# Patient Record
Sex: Female | Born: 1965 | Race: Black or African American | Hispanic: No | Smoking: Never smoker
Health system: Southern US, Community
[De-identification: ages and names within clinical notes are randomized; demographics above are authoritative.]

## PROBLEM LIST (undated history)

## (undated) DIAGNOSIS — E785 Hyperlipidemia, unspecified: Secondary | ICD-10-CM

## (undated) DIAGNOSIS — M25569 Pain in unspecified knee: Secondary | ICD-10-CM

## (undated) DIAGNOSIS — E038 Other specified hypothyroidism: Secondary | ICD-10-CM

## (undated) DIAGNOSIS — N83209 Unspecified ovarian cyst, unspecified side: Secondary | ICD-10-CM

## (undated) DIAGNOSIS — M1990 Unspecified osteoarthritis, unspecified site: Secondary | ICD-10-CM

## (undated) DIAGNOSIS — G8929 Other chronic pain: Secondary | ICD-10-CM

## (undated) DIAGNOSIS — I10 Essential (primary) hypertension: Secondary | ICD-10-CM

## (undated) HISTORY — PX: TUBAL LIGATION: SHX77

## (undated) HISTORY — DX: Hyperlipidemia, unspecified: E78.5

## (undated) HISTORY — DX: Other specified hypothyroidism: E03.8

## (undated) HISTORY — DX: Unspecified osteoarthritis, unspecified site: M19.90

## (undated) HISTORY — DX: Unspecified ovarian cyst, unspecified side: N83.209

## (undated) HISTORY — PX: OVARIAN CYST REMOVAL: SHX89

## (undated) HISTORY — DX: Essential (primary) hypertension: I10

---

## 2001-10-07 ENCOUNTER — Emergency Department (HOSPITAL_COMMUNITY): Admission: EM | Admit: 2001-10-07 | Discharge: 2001-10-08 | Payer: Self-pay | Admitting: Emergency Medicine

## 2001-10-08 ENCOUNTER — Encounter: Payer: Self-pay | Admitting: Emergency Medicine

## 2004-07-27 ENCOUNTER — Emergency Department (HOSPITAL_COMMUNITY): Admission: EM | Admit: 2004-07-27 | Discharge: 2004-07-27 | Payer: Self-pay | Admitting: Emergency Medicine

## 2004-08-13 ENCOUNTER — Emergency Department (HOSPITAL_COMMUNITY): Admission: EM | Admit: 2004-08-13 | Discharge: 2004-08-13 | Payer: Self-pay | Admitting: Emergency Medicine

## 2004-08-17 ENCOUNTER — Inpatient Hospital Stay (HOSPITAL_COMMUNITY): Admission: RE | Admit: 2004-08-17 | Discharge: 2004-08-19 | Payer: Self-pay | Admitting: Obstetrics and Gynecology

## 2005-05-21 ENCOUNTER — Emergency Department (HOSPITAL_COMMUNITY): Admission: EM | Admit: 2005-05-21 | Discharge: 2005-05-21 | Payer: Self-pay | Admitting: Emergency Medicine

## 2005-08-23 ENCOUNTER — Emergency Department (HOSPITAL_COMMUNITY): Admission: EM | Admit: 2005-08-23 | Discharge: 2005-08-23 | Payer: Self-pay | Admitting: Emergency Medicine

## 2005-10-13 ENCOUNTER — Observation Stay (HOSPITAL_COMMUNITY): Admission: EM | Admit: 2005-10-13 | Discharge: 2005-10-14 | Payer: Self-pay | Admitting: Emergency Medicine

## 2007-04-13 ENCOUNTER — Emergency Department (HOSPITAL_COMMUNITY): Admission: EM | Admit: 2007-04-13 | Discharge: 2007-04-13 | Payer: Self-pay | Admitting: Emergency Medicine

## 2007-12-20 ENCOUNTER — Emergency Department (HOSPITAL_COMMUNITY): Admission: EM | Admit: 2007-12-20 | Discharge: 2007-12-20 | Payer: Self-pay | Admitting: Emergency Medicine

## 2008-01-14 ENCOUNTER — Ambulatory Visit (HOSPITAL_COMMUNITY): Admission: RE | Admit: 2008-01-14 | Discharge: 2008-01-14 | Payer: Self-pay | Admitting: Internal Medicine

## 2008-09-03 ENCOUNTER — Emergency Department (HOSPITAL_COMMUNITY): Admission: EM | Admit: 2008-09-03 | Discharge: 2008-09-03 | Payer: Self-pay | Admitting: Emergency Medicine

## 2008-09-23 ENCOUNTER — Emergency Department (HOSPITAL_COMMUNITY): Admission: EM | Admit: 2008-09-23 | Discharge: 2008-09-23 | Payer: Self-pay | Admitting: Emergency Medicine

## 2008-12-06 ENCOUNTER — Emergency Department (HOSPITAL_COMMUNITY): Admission: EM | Admit: 2008-12-06 | Discharge: 2008-12-06 | Payer: Self-pay | Admitting: Emergency Medicine

## 2009-03-10 ENCOUNTER — Emergency Department (HOSPITAL_COMMUNITY): Admission: EM | Admit: 2009-03-10 | Discharge: 2009-03-10 | Payer: Self-pay | Admitting: Emergency Medicine

## 2009-04-13 ENCOUNTER — Emergency Department (HOSPITAL_COMMUNITY): Admission: EM | Admit: 2009-04-13 | Discharge: 2009-04-13 | Payer: Self-pay | Admitting: Emergency Medicine

## 2009-10-21 IMAGING — CR DG CHEST 2V
2 series · 2 of 2 positions shown · non-contrast
Comparison: None

CLINICAL DATA: Chest pain and shortness of breath.

CHEST - 2 VIEW

[view not recorded (1 of 2)]
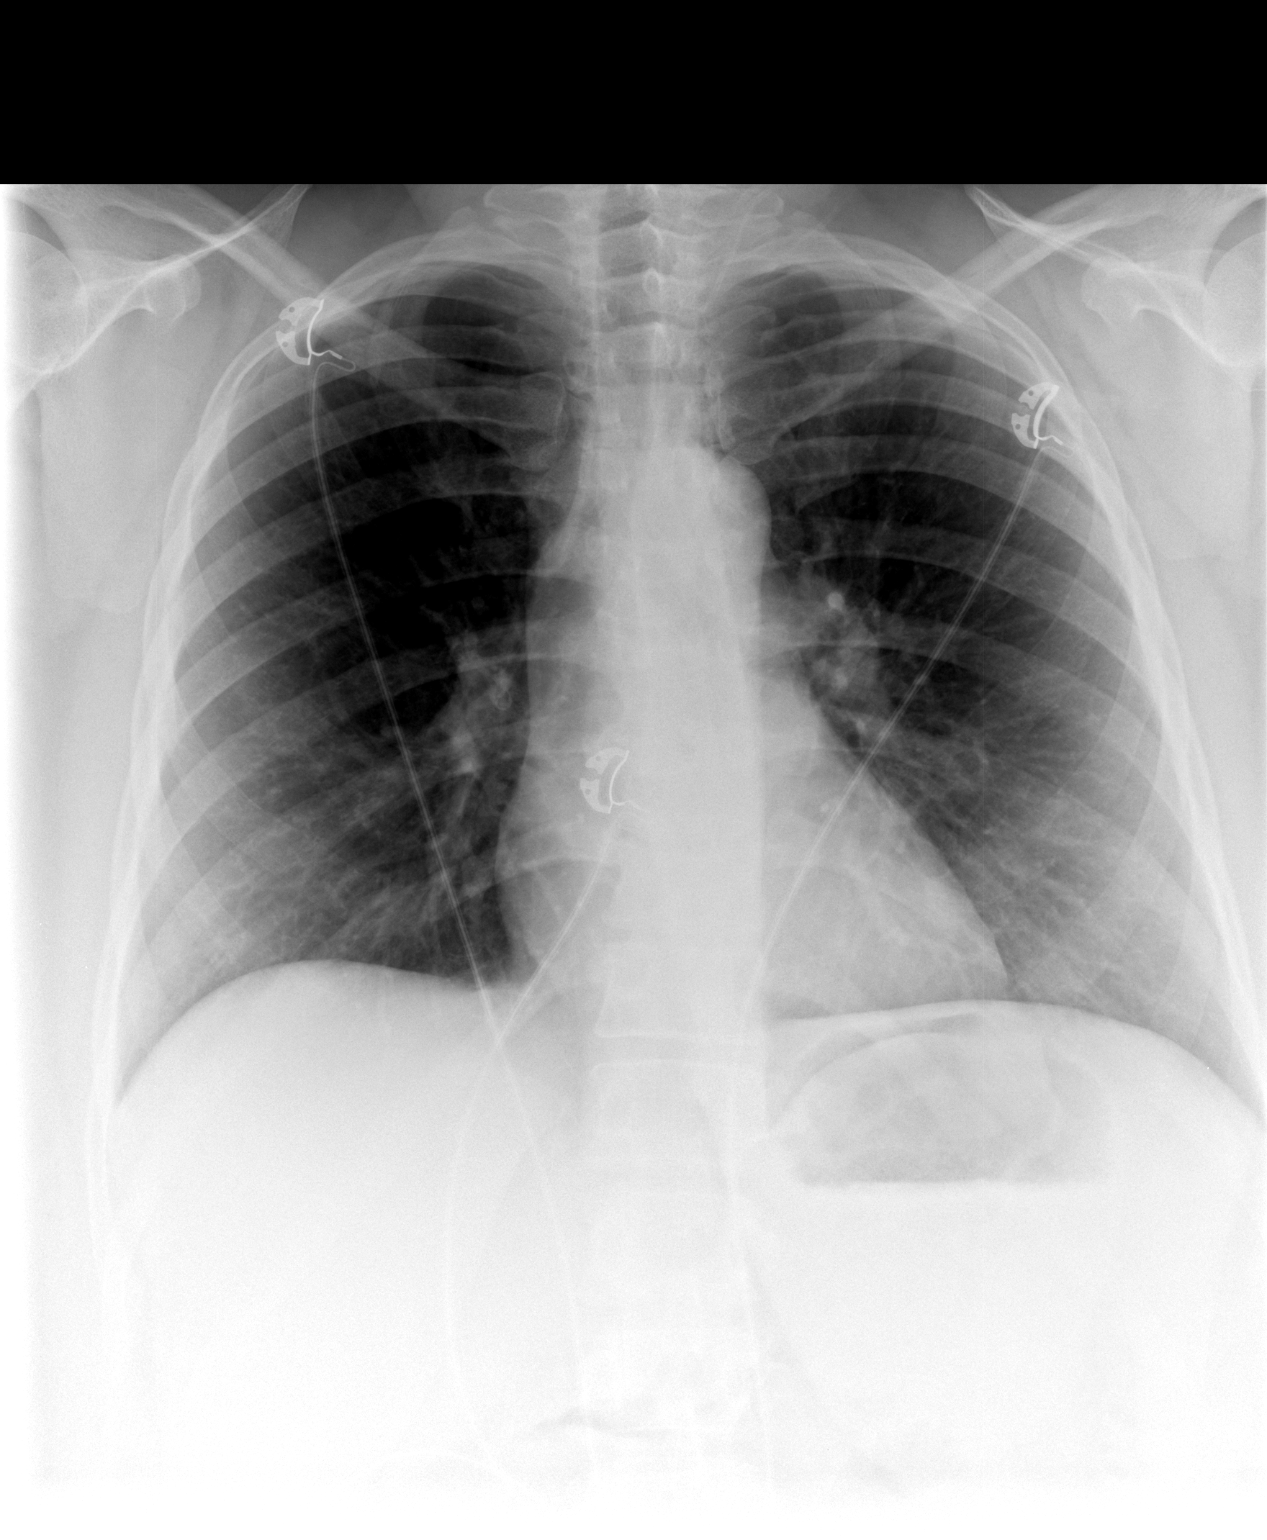

[view not recorded (2 of 2)]
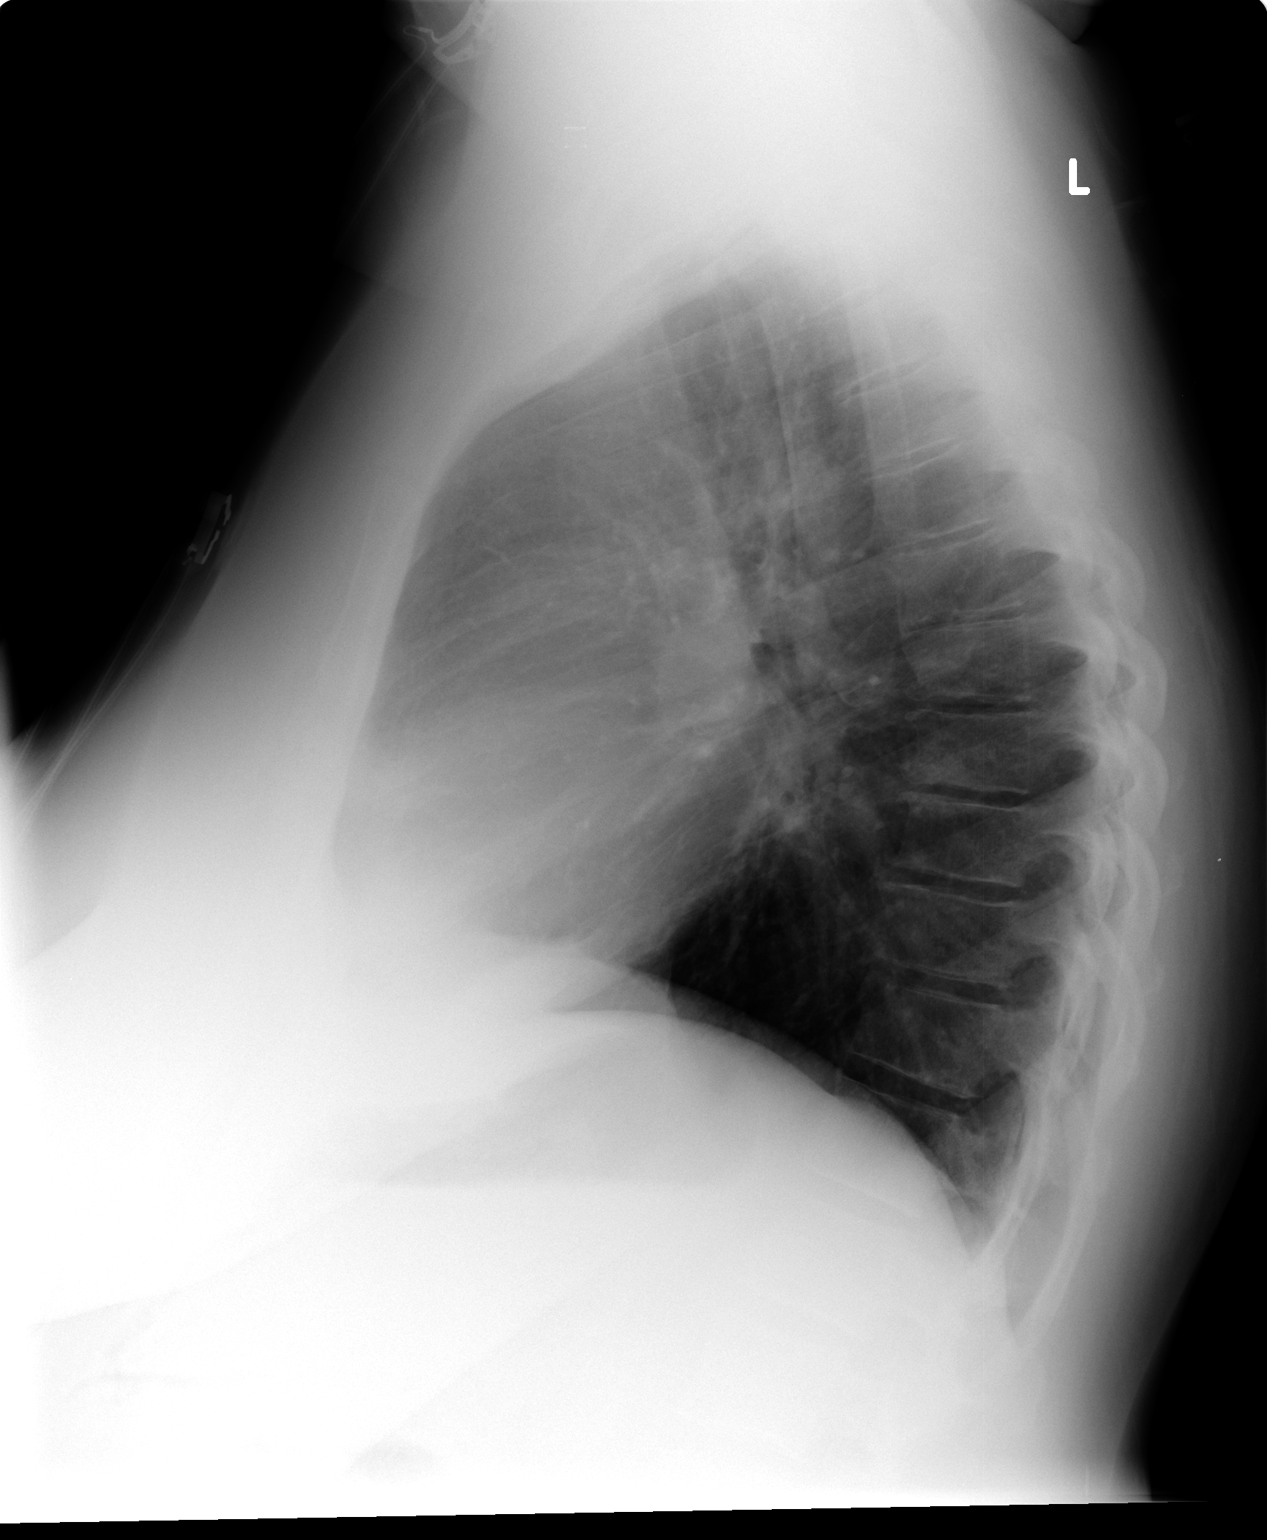

[2 of 2 positions shown; findings below may reference images not displayed]

FINDINGS: Trachea is midline.  Heart size normal.  Lungs are clear.
No pleural fluid.
IMPRESSION: No acute findings.

## 2010-07-02 ENCOUNTER — Emergency Department (HOSPITAL_COMMUNITY): Admission: EM | Admit: 2010-07-02 | Discharge: 2010-07-02 | Payer: Self-pay | Admitting: Emergency Medicine

## 2010-12-03 ENCOUNTER — Encounter: Payer: Self-pay | Admitting: Internal Medicine

## 2010-12-04 ENCOUNTER — Encounter: Payer: Self-pay | Admitting: Family Medicine

## 2011-01-06 ENCOUNTER — Emergency Department (HOSPITAL_COMMUNITY)
Admission: EM | Admit: 2011-01-06 | Discharge: 2011-01-06 | Disposition: A | Discharge status: Home / Self Care | Payer: Self-pay | Attending: Emergency Medicine | Admitting: Emergency Medicine

## 2011-01-06 DIAGNOSIS — M549 Dorsalgia, unspecified: Secondary | ICD-10-CM | POA: Insufficient documentation

## 2011-01-06 DIAGNOSIS — N390 Urinary tract infection, site not specified: Secondary | ICD-10-CM | POA: Insufficient documentation

## 2011-01-06 LAB — URINALYSIS, ROUTINE W REFLEX MICROSCOPIC
Bilirubin Urine: NEGATIVE
Ketones, ur: NEGATIVE mg/dL
Nitrite: NEGATIVE
Protein, ur: NEGATIVE mg/dL
Urobilinogen, UA: 0.2 mg/dL (ref 0.0–1.0)

## 2011-01-06 LAB — URINE MICROSCOPIC-ADD ON

## 2011-01-08 LAB — URINE CULTURE

## 2011-02-19 LAB — URINALYSIS, ROUTINE W REFLEX MICROSCOPIC
Bilirubin Urine: NEGATIVE
Glucose, UA: NEGATIVE mg/dL
Nitrite: NEGATIVE
Protein, ur: NEGATIVE mg/dL
Specific Gravity, Urine: 1.01 (ref 1.005–1.030)
Urobilinogen, UA: 0.2 mg/dL (ref 0.0–1.0)
pH: 5.5 (ref 5.0–8.0)

## 2011-02-19 LAB — CBC
HCT: 35.7 % — ABNORMAL LOW (ref 36.0–46.0)
Hemoglobin: 12.7 g/dL (ref 12.0–15.0)
Platelets: 154 10*3/uL (ref 150–400)
RDW: 12.4 % (ref 11.5–15.5)
WBC: 12.3 10*3/uL — ABNORMAL HIGH (ref 4.0–10.5)

## 2011-02-19 LAB — BASIC METABOLIC PANEL
BUN: 9 mg/dL (ref 6–23)
CO2: 26 mEq/L (ref 19–32)
GFR calc Af Amer: >60 mL/min (ref >60)
Glucose, Bld: 114 mg/dL — ABNORMAL HIGH (ref 70–99)
Sodium: 141 mEq/L (ref 135–145)

## 2011-02-19 LAB — DIFFERENTIAL
Basophils Absolute: 0 10*3/uL (ref 0.0–0.1)
Eosinophils Relative: 0 % (ref 0–5)
Lymphs Abs: 0.9 10*3/uL (ref 0.7–4.0)

## 2011-02-19 LAB — URINE MICROSCOPIC-ADD ON

## 2011-02-26 LAB — BASIC METABOLIC PANEL
CO2: 25 mEq/L (ref 19–32)
Chloride: 114 mEq/L — ABNORMAL HIGH (ref 96–112)
Creatinine, Ser: 0.7 mg/dL (ref 0.4–1.2)
GFR calc Af Amer: >60 mL/min (ref >60)
GFR calc non Af Amer: >60 mL/min (ref >60)
Glucose, Bld: 117 mg/dL — ABNORMAL HIGH (ref 70–99)
Sodium: 144 mEq/L (ref 135–145)

## 2011-02-26 LAB — DIFFERENTIAL
Basophils Absolute: 0.1 10*3/uL (ref 0.0–0.1)
Monocytes Absolute: 0.6 10*3/uL (ref 0.1–1.0)
Neutrophils Relative %: 76 % (ref 43–77)

## 2011-02-26 LAB — CBC
Platelets: 180 10*3/uL (ref 150–400)
WBC: 10.6 10*3/uL — ABNORMAL HIGH (ref 4.0–10.5)

## 2011-02-26 LAB — POCT CARDIAC MARKERS
CKMB, poc: <1 ng/mL — ABNORMAL LOW (ref 1.0–8.0)
Myoglobin, poc: 37.7 ng/mL (ref 12–200)
Troponin i, poc: <0.05 ng/mL (ref 0.00–0.09)

## 2011-02-26 LAB — D-DIMER, QUANTITATIVE: D-Dimer, Quant: 0.52 ug/mL-FEU — ABNORMAL HIGH (ref 0.00–0.48)

## 2011-03-30 NOTE — Op Note (Signed)
NAME:  Erica Greer, Erica Greer NO.:  [id number]   MEDICAL RECORD NO.:  [id number]          PATIENT TYPE:  AMB   LOCATION:  DAY                           FACILITY:  [facility]   PHYSICIAN:  Tilda Burrow, M.D. DATE OF BIRTH:  04/16/1966   DATE OF PROCEDURE:  08/17/2004  DATE OF DISCHARGE:                                 OPERATIVE REPORT   PREOPERATIVE DIAGNOSIS:  Left ovarian dermoid, symptomatic, left lower  quadrant pain.   POSTOPERATIVE DIAGNOSIS:  1.  Left ovarian dermoid, symptomatic, left lower quadrant pain.  2.  Right ovarian cyst.   OPERATION/PROCEDURE:  1.  Left oophorectomy.  2.  Incision and drainage right ovarian cyst.   SURGEON:  Tilda Burrow, M.D.   ASSISTANTAsencion Noble, C.S.T.-F.A.   ANESTHESIA:  General.   COMPLICATIONS:  None.   FINDINGS:  1.  Small anteflexed uterus, large mobile, elongate, extremely hard left      ovary with dermoid tissue and thickened hyperthecosis suspected of the      cortex, restricting expansion of the ovary.  2.  Right ovarian cyst 4 cm in diameter, similarly showing no tendency to      rupture but easily drained.   DESCRIPTION OF PROCEDURE:  The patient was taken to the operating room and  prepped and draped in the usual fashion for lower abdominal surgery.  Foley  catheter was in place.  Pfannenstiel-type incision had been performed in  standard fashion using approximately 15 cm long incision.  Fascia was opened  transversely.  The peritoneal cavity was entered without trauma using a  blunt spreading technique.  Incision was extended sharply, cephalad and  caudad, and the pelvis explored manually.  The large left ovary could easily  be flipped up into the incision.  Kelly clamps were placed beneath it and  the ovary was removed along with the very small amount of remnant of the  tube.  The proximal portion of the tube was left in situ.  The double  ligature with 0 Vicryl was performed and the specimen passed off.   Palpation  revealed that there was a little bit more tightness than is desired in the  area and so the pedicle was split into two parts, the infundibulopelvic  ligament and the broad ligament remnants.  These were tied separately.  Hemostasis was confirmed.   Attention was then directed to the right ovary which was palpated, found to  be equally large, but somewhat softer in character.  Aspiration of a firm,  possibly cystic area drained of a characteristic simple cyst fluid.  The  cyst was opened sharply for a distance of 2 cm, inspected and found no  internal tissues.  Ovary was left intact and in situ.   Laparotomy equipment was removed.  The anterior peritoneum closed with 2-0  chromic, the fascia closed with continue running 0 Vicryl.  Subcutaneous  tissue reapproximated using interrupted 2-0 plain and staple closure of the  skin completed the procedure.  The patient tolerated the procedure well and  went to the recovery room in good condition with sponge  and needle counts  correct.     John   JVF/MEDQ  D:  08/17/2004  T:  08/17/2004  Job:  [id number]   cc:   [facility]

## 2011-03-30 NOTE — Discharge Summary (Signed)
[name], [name] NO.:  [id number]   MEDICAL RECORD NO.:  [id number]          PATIENT TYPE:  INP   LOCATION:  A320                          FACILITY:  [facility]   PHYSICIAN:  Tilda Burrow, M.D. DATE OF BIRTH:  1966/01/13   DATE OF ADMISSION:  08/17/2004  DATE OF DISCHARGE:  10/08/2005LH                                 DISCHARGE SUMMARY   ADMITTING DIAGNOSES:  1.  Left Lower Quadrant pain , suspected left ovarian dermoid cyst.  2.  Suspected adnexal torsion.  3.  Left lower quadrant pain, severe.  4.  Recent history of trichomoniasis.   DISCHARGE DIAGNOSES:  1.  Left ovarian dermoid cyst, benign.  2.  Left lower quadrant pain, resolved.  3.  Recent history of trichomoniasis.   DISCHARGE MEDICATIONS:  1.  Tylox one to two q.4h. p.r.n. pain, dispensed 20.   HOSPITAL SUMMARY:  This 45 year old female with left adnexal pain associated  with an ultrasound confirmed left ovarian dermoid is admitted for removal of  the left tube and ovary and evaluation of the adnexa for other etiologies.  She underwent left oophorectomy and right ovarian cyst aspiration on August 17, 2004 delivering a large left ovarian dermoid and right ovarian cyst which  was simple, cystic, and easily drained.  Postoperatively, the patient had a  soft abdomen with vomiting on the first postoperative day.  Hemoglobin 13.6,  hematocrit 40%, stable.  Postoperatively, she was doing well enough with  passage of gas and tolerance of regular diet with normal bowel function on  August 19, 2004.  Was thus discharged home with follow-up one week for  staple removal and continuation of care.     John   JVF/MEDQ  D:  09/17/2004  T:  09/18/2004  Job:  [id number]

## 2011-03-30 NOTE — H&P (Signed)
NAME:  Erica Greer, Erica Greer NO.:  [id number]   MEDICAL RECORD NO.:  [id number]           PATIENT TYPE:   LOCATION:                                FACILITY:  [facility]   PHYSICIAN:  Tilda Burrow, M.D. DATE OF BIRTH:  1966/10/18   DATE OF ADMISSION:  08/17/2004  DATE OF DISCHARGE:  LH                                HISTORY & PHYSICAL   ADMITTING DIAGNOSIS:  1.  Possible left ovarian dermoid cyst.  2.  Suspected adnexal torsion with severe left lower quadrant pain.  3.  Recent Hx Trichomoniasis.   HISTORY OF PRESENT ILLNESS:  This 45 year old female, gravida 5, para 5,  status post tubal ligation, LMP - August 10, 2004, is admitted at this  time for laparotomy and removal of the left tube and ovary.  She was seen in  the free clinic last Thursday, August 11, 2004, where records were  reviewed indicating that she had a large left ovarian dermoid cyst that was  causing her pain.  Over the weekend, the condition worsened and she was seen  in the emergency room, received analgesics.  She has been unable to work  today, August 16, 2004, due to the pain.   PHYSICAL EXAMINATION:  PELVIC:  Shows exquisitely tender left adnexa, even  though the patient says she feels better than she did yesterday or the day  before.  ABDOMEN:  Bowel sounds are active.  There is no rebound tenderness.   The ultrasound confirmed left adnexal dermoid and an adjacent ovarian cyst.  The combination of these two structures is increasing the possibility of  partial or complete torsion of the ovary.  The slightly improved symptoms  allow [place] to plan surgery for tomorrow.  The patient is comfortable with this  plan.  We will proceed with laparotomy and removal of the left tube and  ovary tomorrow.  The procedure is reviewed with the patient including risks,  benefits, and complications, the need for additional surgeries should  abnormalities beyond those already identified be encountered have  been  discussed.   PAST MEDICAL HISTORY:  Benign.   SURGICAL HISTORY:  Tubal ligation, 1995, Tilda Burrow, M.D. at [facility].   ALLERGIES:  None.   MEDICATIONS:  Hydrocodone, Tylenol.   VITAL SIGNS:  Height , weight 248, blood pressure 120/78.   PHYSICAL EXAMINATION:  GENERAL:  Shows a large-framed, stocky, African  American female, alert, oriented x 3.  NECK:  Supple.  CARDIOVASCULAR:  Unremarkable.  BREAST:  Deferred.  ABDOMEN:  Shows tenderness in the left lower quadrant without rebound.  GENITALIA:  External genitalia shows normal female anatomy with frothy  vaginal discharge with odor.  Wet prep shows Trichomonas present.  Cervix is  mobile and nontender.  Uterus is anteflexed.  There is pain on the left side  and the dermoid and the cyst are flipped anteriorly and press on the dome of  the bladder.  Bowel sounds are present.   IMPRESSION:  1.  Left ovarian dermoid, left ovarian cyst.  2.  Possible partial torsion versus simply pressure  effect causing      discomfort.   We will proceed with laparotomy in a.m.  The patient is comfortable using  oral analgesics at present.  Risks and benefits of surgery have been  discussed with the patient.  She is to go home tonight, take Tindamax 500 mg  four tablets for her trichomoniasis, Dulcolax to evacuate the bowel, and  remain NPO.  Surgery at noon on August 17, 2004.     John   JVF/MEDQ  D:  08/16/2004  T:  08/16/2004  Job:  [id number]   cc:   [facility]

## 2011-03-30 NOTE — H&P (Signed)
NAME:  Erica, Greer NO.:  [id number]   MEDICAL RECORD NO.:  [id number]          PATIENT TYPE:  EMS   LOCATION:  ED                            FACILITY:  [facility]   PHYSICIAN:  Margaretmary Dys, M.D.DATE OF BIRTH:  07-30-1966   DATE OF ADMISSION:  10/13/2005  DATE OF DISCHARGE:  LH                                HISTORY & PHYSICAL   ADMISSION DIAGNOSES:  1.  Nausea.  2.  Vomiting.  3.  Diarrhea.  4.  Acute gastroenteritis.  5.  Dehydration.   PRIMARY CARE PHYSICIAN:  Unassigned.   CHIEF COMPLAINT:  Nausea, vomiting, diarrhea of one day duration.   HISTORY OF PRESENT ILLNESS:  Erica Greer is a 45 year old African American  female who presents to the emergency room with approximately a 13-hour  history of nausea, intractable vomiting, abdominal cramping and some  diarrhea.   The patient said her symptoms were sudden. Most of the abdominal pain in the  epigastric area appeared to resolve transiently with vomiting. She denies  any abdominal distention. She had no fever or chills, no night sweats, no  headaches, dizziness or lightheadedness. There is no similar history in her  other family members. She does not remember what she ate on Thursday night.  She does not eat breakfast.   Evaluation in the emergency room revealed a negative CT of the abdomen and  pelvis. However, she does continue to vomit despite receiving Zofran and  Phenergan,  the emergency room physician then put an NG tube in, and the  patient has felt some relief since.   The patient has been admitted now for further evaluation and management. She  says the abdominal pain is better. It remains in the epigastric region about  4/10 with no radiation.   REVIEW OF SYSTEMS:  A 10-point review of systems is otherwise negative  except as mentioned in the History of Present Illness.   PAST MEDICAL HISTORY:  None.   CHRONIC MEDICATIONS:  None.   FAMILY HISTORY:  Noncontributory.   SOCIAL  HISTORY:  She is married, lives with husband who is present during  this interview. She has five children. No symptoms in family members. She is  a lifelong nonsmoker. She does not drink alcohol. Denies any use of drugs.  She works in a [employer].   PHYSICAL EXAMINATION:  GENERAL:  The patient was conscious, alert,  comfortable but did appear fatigued.  GASTROINTESTINAL:  She had an NG tube in place.  VITAL SIGNS:  On arrival her blood pressure 151/98, temperature 98.7, pulse  72, respiratory rate 16, a pain scale that was 10/10, oxygen saturation 99%  on room air.  HEENT:  Normocephalic, atraumatic. The patient has an NG tube in her right  nostril. Oral mucosa was dry. No exudates were noted.  NECK:  Supple with no JVD or lymphadenopathy.  LUNGS:  Clear and equal with good air entry bilaterally.  HEART:  S1, S2, regular. No S3, S4, gallops or rubs.  ABDOMEN:  Soft, nontender, bowel sounds are positive. No masses palpable.  EXTREMITIES:  No pitting pedal edema.  No cough, induration or tenderness was  noted.  SKIN:  Grossly intact, no focal deficits.   LABORATORY STUDIES:  White blood cell count 10.6, hemoglobin 13.9,  hematocrit 40.9, platelet count 189, neutrophils 85%, lymphocytes 11%.  Sodium 140, potassium 3.5, chloride 111, CO2 21, glucose 129, BUN 7,  creatinine 0.7, total bili being 0.6, AST 21, ALT 28, total protein 6.6,  albumin 3.8, calcium 8.6. Urine pregnancy test was negative. Urinalysis was  negative.   ASSESSMENT/PLAN:  Erica Greer is a 45 year old African American female who  presents with nausea, vomiting, abdominal pain.   It does appear that she may likely have a viral gastroenteritis. The plan is  to admit her at this time for hydration, and we will control nausea and  vomiting. She currently has an NG tube. We will keep that in place for a few  more hours. If the patient settles down and feels a lot better we can pull  the NG tube and start her on  some clear liquids.   We will check followup on electrolytes on her in the morning. I have  discussed the above plan with the patient who verbalized full understanding.   CODE STATUS:  She is a full code.      Margaretmary Dys, M.D.  Electronically Signed     AM/MEDQ  D:  10/13/2005  T:  10/13/2005  Job:  [id number]

## 2011-07-09 ENCOUNTER — Encounter: Payer: Self-pay | Admitting: Emergency Medicine

## 2011-07-09 ENCOUNTER — Emergency Department (HOSPITAL_COMMUNITY)
Admission: EM | Admit: 2011-07-09 | Discharge: 2011-07-09 | Disposition: A | Discharge status: Home / Self Care | Payer: Self-pay | Attending: Emergency Medicine | Admitting: Emergency Medicine

## 2011-07-09 DIAGNOSIS — J069 Acute upper respiratory infection, unspecified: Secondary | ICD-10-CM | POA: Insufficient documentation

## 2011-07-09 MED ORDER — OXYMETAZOLINE HCL 0.05 % NA SOLN
1.0000 | Freq: Once | NASAL | Status: AC
  Administered 2011-07-09: 1 via NASAL
  Filled 2011-07-09: qty 15

## 2011-07-09 NOTE — ED Notes (Signed)
Pt c/o congestion, headache and sweating since yesterday.

## 2011-07-09 NOTE — ED Provider Notes (Signed)
History    Nasal congestion  Patient states she had nasal congestion, some sore throat, cough, and headache.  Patient states she has had increased thirst.  Denies weight gain, loss, or history of diabetes.   CSN: [id number] Arrival date & time: 07/09/2011 12:59 PM  Chief Complaint  Patient presents with  . Excessive Sweating  . Headache  . Nasal Congestion   The history is provided by the patient.    History reviewed. No pertinent past medical history.  History reviewed. No pertinent past surgical history.  History reviewed. No pertinent family history.  History  Substance Use Topics  . Smoking status: Not on file  . Smokeless tobacco: Not on file  . Alcohol Use: No    OB History    Grav Para Term Preterm Abortions TAB SAB Ect Mult Living                  Review of Systems  All other systems reviewed and are negative.    Physical Exam  BP 138/77  Pulse 91  Temp 98.8 F (37.1 C)  Resp 20  Ht 5\' 3"  (1.6 m)  Wt 236 lb (107.049 kg)  BMI 41.81 kg/m2  SpO2 100%  LMP 07/01/2011  Physical Exam  Nursing note and vitals reviewed. Constitutional: She is oriented to person, place, and time. She appears well-developed and well-nourished.  HENT:  Head: Normocephalic.  Eyes: Conjunctivae are normal. Pupils are equal, round, and reactive to light.  Neck: Normal range of motion. Neck supple.  Cardiovascular: Normal rate.   Pulmonary/Chest: Effort normal and breath sounds normal.  Abdominal: Soft. Bowel sounds are normal.  Musculoskeletal: Normal range of motion.  Neurological: She is alert and oriented to person, place, and time.  Skin: Skin is warm and dry.  Psychiatric: She has a normal mood and affect.    ED Course  Procedures  MDM Patient with normal bs.  Symptoms c.w. Glenford Peers.  Patient states she needs note for work.      Hilario Quarry, MD 07/09/11 [phone]

## 2011-08-13 LAB — DIFFERENTIAL
Eosinophils Absolute: 0.2
Lymphs Abs: 2.3
Monocytes Absolute: 0.7
Monocytes Relative: 8
Neutrophils Relative %: 65

## 2011-08-13 LAB — COMPREHENSIVE METABOLIC PANEL
ALT: 13
AST: 13
Albumin: 4
Alkaline Phosphatase: 42
BUN: 8
CO2: 26
Calcium: 9
Chloride: 107
Creatinine, Ser: 0.73
GFR calc Af Amer: >60
GFR calc non Af Amer: >60
Glucose, Bld: 94
Potassium: 4.1
Sodium: 139
Total Bilirubin: 0.3
Total Protein: 6.8

## 2011-08-13 LAB — CBC
HCT: 42.6
Hemoglobin: 14.3
MCHC: 33.5
MCV: 80.8
Platelets: 166
RBC: 5.27 — ABNORMAL HIGH
RDW: 13.3
WBC: 9.5

## 2011-08-13 LAB — URINALYSIS, ROUTINE W REFLEX MICROSCOPIC
Bilirubin Urine: NEGATIVE
Glucose, UA: NEGATIVE
Hgb urine dipstick: NEGATIVE
Ketones, ur: NEGATIVE
Nitrite: NEGATIVE
Protein, ur: NEGATIVE
Specific Gravity, Urine: <1.005 — ABNORMAL LOW
Urobilinogen, UA: 0.2
pH: 5.5

## 2011-08-13 LAB — URINE MICROSCOPIC-ADD ON

## 2011-08-13 LAB — WET PREP, GENITAL

## 2011-08-13 LAB — PREGNANCY, URINE: Preg Test, Ur: NEGATIVE

## 2011-08-13 LAB — GC/CHLAMYDIA PROBE AMP, GENITAL: GC Probe Amp, Genital: NEGATIVE

## 2011-08-13 LAB — LIPASE, BLOOD: Lipase: 23

## 2011-08-14 LAB — COMPREHENSIVE METABOLIC PANEL
Albumin: 3.9
BUN: 6
Calcium: 9
Creatinine, Ser: 0.63
Total Protein: 6.7

## 2011-08-14 LAB — URINALYSIS, ROUTINE W REFLEX MICROSCOPIC
Glucose, UA: NEGATIVE
Hgb urine dipstick: NEGATIVE
Ketones, ur: NEGATIVE
Protein, ur: NEGATIVE

## 2011-08-14 LAB — CBC
HCT: 41.2
MCV: 80.8
Platelets: 160
RDW: 12.7

## 2011-08-14 LAB — DIFFERENTIAL
Basophils Absolute: 0.1
Lymphocytes Relative: 25
Monocytes Absolute: 0.7
Monocytes Relative: 8
Neutro Abs: 5

## 2011-10-08 ENCOUNTER — Encounter (HOSPITAL_COMMUNITY): Payer: Self-pay

## 2011-10-08 DIAGNOSIS — R51 Headache: Secondary | ICD-10-CM | POA: Insufficient documentation

## 2011-10-08 DIAGNOSIS — K029 Dental caries, unspecified: Secondary | ICD-10-CM | POA: Insufficient documentation

## 2011-10-08 DIAGNOSIS — K089 Disorder of teeth and supporting structures, unspecified: Secondary | ICD-10-CM | POA: Insufficient documentation

## 2011-10-08 NOTE — ED Notes (Signed)
Left lower toothache that started today

## 2011-10-09 ENCOUNTER — Emergency Department (HOSPITAL_COMMUNITY)
Admission: EM | Admit: 2011-10-09 | Discharge: 2011-10-09 | Disposition: A | Discharge status: Home / Self Care | Payer: Self-pay | Attending: Emergency Medicine | Admitting: Emergency Medicine

## 2011-10-09 DIAGNOSIS — K0889 Other specified disorders of teeth and supporting structures: Secondary | ICD-10-CM

## 2011-10-09 MED ORDER — OXYCODONE-ACETAMINOPHEN 5-325 MG PO TABS
1.0000 | ORAL_TABLET | Freq: Once | ORAL | Status: AC
  Administered 2011-10-09: 1 via ORAL
  Filled 2011-10-09: qty 1

## 2011-10-09 MED ORDER — PENICILLIN V POTASSIUM 500 MG PO TABS: ORAL_TABLET | ORAL | Status: DC

## 2011-10-09 MED ORDER — OXYCODONE-ACETAMINOPHEN 5-325 MG PO TABS: 1.0000 | ORAL_TABLET | Freq: Four times a day (QID) | ORAL | Status: AC | PRN

## 2011-10-09 MED ORDER — IBUPROFEN 800 MG PO TABS: 800.0000 mg | ORAL_TABLET | Freq: Three times a day (TID) | ORAL | Status: AC

## 2011-10-09 MED ORDER — IBUPROFEN 800 MG PO TABS
800.0000 mg | ORAL_TABLET | Freq: Once | ORAL | Status: AC
  Administered 2011-10-09: 800 mg via ORAL
  Filled 2011-10-09: qty 1

## 2011-10-09 MED ORDER — PENICILLIN V POTASSIUM 250 MG PO TABS
500.0000 mg | ORAL_TABLET | Freq: Once | ORAL | Status: AC
  Administered 2011-10-09: 500 mg via ORAL
  Filled 2011-10-09 (×2): qty 1

## 2011-10-09 NOTE — ED Provider Notes (Signed)
History     CSN: [id number] Arrival date & time: No admission date for patient encounter.   First MD Initiated Contact with Patient 10/09/11 0011      Chief Complaint  Patient presents with  . Dental Pain    (Consider location/radiation/quality/duration/timing/severity/associated sxs/prior treatment) Patient is a 45 y.o. female presenting with tooth pain. The history is provided by the patient.  Dental PainThe primary symptoms include mouth pain and headaches. Primary symptoms do not include shortness of breath or cough. The symptoms began 6 to 12 hours ago. The symptoms are worsening. The symptoms are recurrent. The symptoms occur constantly.  The headache is not associated with photophobia.  Additional symptoms include: dental sensitivity to temperature, gum tenderness and jaw pain. Additional symptoms do not include: nosebleeds. Medical issues do not include: alcohol problem, smoking and immunosuppression.    History reviewed. No pertinent past medical history.  History reviewed. No pertinent past surgical history.  No family history on file.  History  Substance Use Topics  . Smoking status: Never Smoker   . Smokeless tobacco: Not on file  . Alcohol Use: No    OB History    Grav Para Term Preterm Abortions TAB SAB Ect Mult Living                  Review of Systems  Constitutional: Negative for activity change.       All ROS Neg except as noted in HPI  HENT: Negative for nosebleeds and neck pain.   Eyes: Negative for photophobia and discharge.  Respiratory: Negative for cough, shortness of breath and wheezing.   Cardiovascular: Negative for chest pain and palpitations.  Gastrointestinal: Negative for abdominal pain and blood in stool.  Genitourinary: Negative for dysuria, frequency and hematuria.  Musculoskeletal: Negative for back pain and arthralgias.  Skin: Negative.   Neurological: Positive for headaches. Negative for dizziness, seizures and speech difficulty.   Psychiatric/Behavioral: Negative for hallucinations and confusion.    Allergies  Hydrocodone  Home Medications   Current Outpatient Rx  Name Route Sig Dispense Refill  . ACETAMINOPHEN 500 MG PO TABS Oral Take 1,000 mg by mouth every 6 (six) hours as needed. For pain       BP 148/76  Pulse 72  Temp(Src) 98.2 F (36.8 C) (Oral)  Resp 18  Ht 5\' 3"  (1.6 m)  Wt 250 lb (113.399 kg)  BMI 44.29 kg/m2  SpO2 100%  LMP 10/01/2011  Physical Exam  Nursing note and vitals reviewed. Constitutional: She is oriented to person, place, and time. She appears well-developed and well-nourished.  Non-toxic appearance.  HENT:  Head: Normocephalic.  Right Ear: Tympanic membrane and external ear normal.  Left Ear: Tympanic membrane and external ear normal.  Mouth/Throat: Dental caries present.       Left lower gum swollen. Cavity of the left lower 1st molar.  Eyes: EOM and lids are normal. Pupils are equal, round, and reactive to light.  Neck: Normal range of motion. Neck supple. Carotid bruit is not present.  Cardiovascular: Normal rate, regular rhythm, normal heart sounds, intact distal pulses and normal pulses.   Pulmonary/Chest: Breath sounds normal. No respiratory distress.  Abdominal: Soft. Bowel sounds are normal. There is no tenderness. There is no guarding.  Musculoskeletal: Normal range of motion.  Lymphadenopathy:       Head (right side): No submandibular adenopathy present.       Head (left side): No submandibular adenopathy present.    She has no cervical adenopathy.  Neurological: She is alert and oriented to person, place, and time. She has normal strength. No cranial nerve deficit or sensory deficit.  Skin: Skin is warm and dry.  Psychiatric: She has a normal mood and affect. Her speech is normal.    ED Course  Procedures (including critical care time)  Labs Reviewed - No data to display No results found.   Dx: Toothache   MDM  I have reviewed nursing notes, vital  signs, and all appropriate lab and imaging results for this patient. Pt to see a dentist as soon as possible. Rx for Penicillin, ibuprofen and percocet        Kathie Dike, [place] 10/09/11 0119  Medical screening examination/treatment/procedure(s) were performed by non-physician practitioner and as supervising physician I was immediately available for consultation/collaboration.  Sunnie Nielsen, MD 10/09/11 [phone]

## 2012-03-08 ENCOUNTER — Encounter (HOSPITAL_COMMUNITY): Payer: Self-pay | Admitting: *Deleted

## 2012-03-08 ENCOUNTER — Emergency Department (HOSPITAL_COMMUNITY)
Admission: EM | Admit: 2012-03-08 | Discharge: 2012-03-08 | Disposition: A | Discharge status: Home / Self Care | Payer: Self-pay | Attending: Emergency Medicine | Admitting: Emergency Medicine

## 2012-03-08 DIAGNOSIS — M545 Low back pain, unspecified: Secondary | ICD-10-CM | POA: Insufficient documentation

## 2012-03-08 MED ORDER — CYCLOBENZAPRINE HCL 10 MG PO TABS: 10.0000 mg | ORAL_TABLET | Freq: Two times a day (BID) | ORAL | Status: AC | PRN

## 2012-03-08 MED ORDER — HYDROMORPHONE HCL PF 2 MG/ML IJ SOLN
2.0000 mg | Freq: Once | INTRAMUSCULAR | Status: AC
  Administered 2012-03-08: 2 mg via INTRAMUSCULAR
  Filled 2012-03-08: qty 1

## 2012-03-08 MED ORDER — HYDROCODONE-ACETAMINOPHEN 5-325 MG PO TABS: 1.0000 | ORAL_TABLET | Freq: Four times a day (QID) | ORAL | Status: AC | PRN

## 2012-03-08 MED ORDER — NAPROXEN 500 MG PO TABS: 500.0000 mg | ORAL_TABLET | Freq: Two times a day (BID) | ORAL | Status: AC

## 2012-03-08 NOTE — ED Notes (Signed)
Pt c/o lower back pain since last pm. Pt denies injury or urinary symptoms. Alert and oriented x 3. Skin warm and dry. Color pink. Pt crying.

## 2012-03-08 NOTE — ED Notes (Signed)
Right sided lower back pain since last night.  Denies injury, denies radiation.

## 2012-03-08 NOTE — ED Provider Notes (Signed)
History   This chart was scribed for Shelda Jakes, MD by Toya Smothers and Clarita Crane. The patient was seen in room APA08/APA08. Patient's care was started at 0958.    CSN: [id number]  Arrival date & time 03/08/12  1191   First MD Initiated Contact with Patient 03/08/12 1028      Chief Complaint  Patient presents with  . Back Pain    (Consider location/radiation/quality/duration/timing/severity/associated sxs/prior treatment) HPI Erica Greer is a 46 y.o. female who presents to the Emergency Department complaining of gradual constant moderate back pain localized to lower lumbar region onset 12 hours ago and persistent since. Patient rates pain at 10/10.Patient denies injury to back, numbness, tinging leg pain, chest pain, abdominal pain, nausea, vomiting, HA, cough, congestion, and dysuria.  Pt states that they have taken Aleve with no relief. Patient has a h/o back problems. Patient  denies having PCP.    History reviewed. No pertinent past medical history.  Past Surgical History  Procedure Date  . Tubal ligation     No family history on file.  History  Substance Use Topics  . Smoking status: Never Smoker   . Smokeless tobacco: Not on file  . Alcohol Use: No    OB History    Grav Para Term Preterm Abortions TAB SAB Ect Mult Living                  Review of Systems  Constitutional: Negative for fever.  HENT: Negative for rhinorrhea.   Eyes: Negative for pain.  Respiratory: Negative for cough and shortness of breath.   Cardiovascular: Negative for chest pain.  Gastrointestinal: Negative for nausea, vomiting, abdominal pain and diarrhea.  Genitourinary: Negative for dysuria.  Musculoskeletal: Positive for back pain.  Skin: Negative for rash.  Neurological: Negative for weakness and headaches.  Psychiatric/Behavioral: Negative for confusion.    Allergies  Hydrocodone  Home Medications   Current Outpatient Rx  Name Route Sig Dispense Refill  .  ACETAMINOPHEN 500 MG PO TABS Oral Take 1,000 mg by mouth every 6 (six) hours as needed. For pain     . CYCLOBENZAPRINE HCL 10 MG PO TABS Oral Take 1 tablet (10 mg total) by mouth 2 (two) times daily as needed for muscle spasms. 20 tablet 0  . HYDROCODONE-ACETAMINOPHEN 5-325 MG PO TABS Oral Take 1-2 tablets by mouth every 6 (six) hours as needed for pain. 10 tablet 0  . NAPROXEN 500 MG PO TABS Oral Take 1 tablet (500 mg total) by mouth 2 (two) times daily. 14 tablet 0  . PENICILLIN V POTASSIUM 500 MG PO TABS  2 po bid with food 28 tablet 0    BP 160/72  Pulse 61  Temp(Src) 97.6 F (36.4 C) (Oral)  Resp 16  Ht 5\' 4"  (1.626 m)  Wt 245 lb (111.131 kg)  BMI 42.05 kg/m2  SpO2 95%  LMP 01/25/2012  Physical Exam  Nursing note and vitals reviewed. Constitutional: She is oriented to person, place, and time. She appears well-developed and well-nourished. No distress.  HENT:  Head: Normocephalic and atraumatic.  Eyes: EOM are normal. Pupils are equal, round, and reactive to light.  Neck: Neck supple. No tracheal deviation present.  Cardiovascular: Normal rate, regular rhythm, normal heart sounds and intact distal pulses.  Exam reveals no gallop and no friction rub.   No murmur heard. Pulmonary/Chest: Effort normal. No respiratory distress. She has no wheezes. She has no rales. She exhibits no tenderness.  Abdominal: Soft. Bowel  sounds are normal. She exhibits no distension. There is no tenderness.  Musculoskeletal: Normal range of motion. She exhibits no edema.       Spine non-tender.   Neurological: She is alert and oriented to person, place, and time. She has normal reflexes. She displays normal reflexes. No cranial nerve deficit or sensory deficit. She exhibits normal muscle tone. Coordination normal.  Skin: Skin is warm and dry.  Psychiatric: She has a normal mood and affect. Her behavior is normal.    ED Course  Procedures (including critical care time)  DIAGNOSTIC STUDIES: Oxygen  Saturation is 95% on room air, adequate by my interpretation.    COORDINATION OF CARE:    Labs Reviewed - No data to display No results found.   1. Lumbar back pain       MDM  Symptoms consistent with lumbar low back pain no acute injury no focal neuro deficits the lower extremities. No urinary tract symptoms. Will treat with bed rest pain medicine muscle relaxers anti-inflammatories.      I personally performed the services described in this documentation, which was scribed in my presence. The recorded information has been reviewed and considered.    Shelda Jakes, MD 03/08/12 1120

## 2012-03-08 NOTE — Discharge Instructions (Signed)
Back Pain, Adult Low back pain is very common. About 1 in 5 people have back pain.The cause of low back pain is rarely dangerous. The pain often gets better over time.About half of people with a sudden onset of back pain feel better in just 2 weeks. About 8 in 10 people feel better by 6 weeks.  CAUSES Some common causes of back pain include:  Strain of the muscles or ligaments supporting the spine.   Wear and tear (degeneration) of the spinal discs.   Arthritis.   Direct injury to the back.  DIAGNOSIS Most of the time, the direct cause of low back pain is not known.However, back pain can be treated effectively even when the exact cause of the pain is unknown.Answering your caregiver's questions about your overall health and symptoms is one of the most accurate ways to make sure the cause of your pain is not dangerous. If your caregiver needs more information, he or she may order lab work or imaging tests (X-rays or MRIs).However, even if imaging tests show changes in your back, this usually does not require surgery. HOME CARE INSTRUCTIONS For many people, back pain returns.Since low back pain is rarely dangerous, it is often a condition that people can learn to manageon their own.   Remain active. It is stressful on the back to sit or stand in one place. Do not sit, drive, or stand in one place for more than 30 minutes at a time. Take short walks on level surfaces as soon as pain allows.Try to increase the length of time you walk each day.   Do not stay in bed.Resting more than 1 or 2 days can delay your recovery.   Do not avoid exercise or work.Your body is made to move.It is not dangerous to be active, even though your back may hurt.Your back will likely heal faster if you return to being active before your pain is gone.   Pay attention to your body when you bend and lift. Many people have less discomfortwhen lifting if they bend their knees, keep the load close to their  bodies,and avoid twisting. Often, the most comfortable positions are those that put less stress on your recovering back.   Find a comfortable position to sleep. Use a firm mattress and lie on your side with your knees slightly bent. If you lie on your back, put a pillow under your knees.   Only take over-the-counter or prescription medicines as directed by your caregiver. Over-the-counter medicines to reduce pain and inflammation are often the most helpful.Your caregiver may prescribe muscle relaxant drugs.These medicines help dull your pain so you can more quickly return to your normal activities and healthy exercise.   Put ice on the injured area.   Put ice in a plastic bag.   Place a towel between your skin and the bag.   Leave the ice on for 15 to 20 minutes, 3 to 4 times a day for the first 2 to 3 days. After that, ice and heat may be alternated to reduce pain and spasms.   Ask your caregiver about trying back exercises and gentle massage. This may be of some benefit.   Avoid feeling anxious or stressed.Stress increases muscle tension and can worsen back pain.It is important to recognize when you are anxious or stressed and learn ways to manage it.Exercise is a great option.  SEEK MEDICAL CARE IF:  You have pain that is not relieved with rest or medicine.   You have   pain that does not improve in 1 week.   You have new symptoms.   You are generally not feeling well.  SEEK IMMEDIATE MEDICAL CARE IF:   You have pain that radiates from your back into your legs.   You develop new bowel or bladder control problems.   You have unusual weakness or numbness in your arms or legs.   You develop nausea or vomiting.   You develop abdominal pain.   You feel faint.  Document Released: 10/29/2005 Document Revised: 10/18/2011 Document Reviewed: 03/19/2011 [facility] Patient Information 2012 [place], [place].  RESOURCE GUIDE  Dental Problems  Patients with Medicaid: [facility] [phone] [address]                                           [phone] [address] Phone:  [phone]                                                  Phone:  [phone]  If unable to pay or uninsured, contact:  [facility] or [facility]. to become qualified for the adult dental clinic.  Chronic Pain Problems Contact [facility]  [phone] Patients need to be referred by their primary care doctor.  Insufficient Money for Medicine Contact [employer] Way:  call "211" or [facility] 973-395-3299.  No Primary Care Doctor Call [facility]  [phone] Other agencies that provide inexpensive medical care    [facility]  [phone]    [facility]  [phone]    [facility]  ([phone]    [facility]  [phone]    [facility]  [phone]    [facility]  [phone]  Psychological Services [facility]  [phone] [facility] Services  [phone] [facility]   [phone] (emergency services ([phone])  Substance Abuse Resources [facility]  [phone] [facility] [phone] [facility] [phone] Floydene Flock [phone] Residential & Outpatient Substance Abuse Program  ([phone]  Abuse/Neglect [facility] Child Abuse Hotline 9796124362 [facility] Child Abuse Hotline 780-848-9947 (After Hours)  Emergency Shelter [facility] 8545372263  Maternity Homes Room at the [place] [facility] [phone] [facility] [phone]  MRSA Hotline #:   4357648657    [facility] Resources  [facility]     [employer]                          [facility] [address] [place]                       [address]      [address]  [address]  Cristobal Goldmann Phone:  [phone]                                   Phone:  [phone]                 Phone:  [phone]  [facility] Mental Health Phone:  [phone]  [facility] Child Abuse Hotline [phone] (After Hours)    Rest for the next 2 days. Work note provided. Take Naprosyn without fail for the next 7 days. Supplement with hydrocodone as needed for more severe pain. Take Flexeril until improving. Resource guide provided to help you find a primary care provider if not better in 2-3 days. Return for any new or worse symptoms.

## 2012-05-13 ENCOUNTER — Emergency Department (HOSPITAL_COMMUNITY)
Admission: EM | Admit: 2012-05-13 | Discharge: 2012-05-13 | Disposition: A | Discharge status: Home / Self Care | Payer: Self-pay | Attending: Emergency Medicine | Admitting: Emergency Medicine

## 2012-05-13 ENCOUNTER — Encounter (HOSPITAL_COMMUNITY): Payer: Self-pay | Admitting: *Deleted

## 2012-05-13 DIAGNOSIS — K047 Periapical abscess without sinus: Secondary | ICD-10-CM | POA: Insufficient documentation

## 2012-05-13 DIAGNOSIS — K089 Disorder of teeth and supporting structures, unspecified: Secondary | ICD-10-CM | POA: Insufficient documentation

## 2012-05-13 MED ORDER — PENICILLIN V POTASSIUM 500 MG PO TABS: 500.0000 mg | ORAL_TABLET | Freq: Three times a day (TID) | ORAL | Status: AC

## 2012-05-13 MED ORDER — OXYCODONE-ACETAMINOPHEN 5-325 MG PO TABS
1.0000 | ORAL_TABLET | Freq: Once | ORAL | Status: AC
  Administered 2012-05-13: 1 via ORAL
  Filled 2012-05-13: qty 1

## 2012-05-13 MED ORDER — PENICILLIN V POTASSIUM 250 MG PO TABS
500.0000 mg | ORAL_TABLET | Freq: Once | ORAL | Status: AC
  Administered 2012-05-13: 500 mg via ORAL
  Filled 2012-05-13: qty 2

## 2012-05-13 MED ORDER — OXYCODONE-ACETAMINOPHEN 5-325 MG PO TABS: 1.0000 | ORAL_TABLET | ORAL | Status: AC | PRN

## 2012-05-13 NOTE — ED Provider Notes (Signed)
History     CSN: [id number]  Arrival date & time 05/13/12  1026   First MD Initiated Contact with Patient 05/13/12 1037      Chief Complaint  Patient presents with  . Dental Pain    (Consider location/radiation/quality/duration/timing/severity/associated sxs/prior treatment) HPI Comments: Erica Greer presents with a 2 day history of dental pain and gingival swelling.   The patient has a history of injury to the tooth involved which has recently started to cause pain.  There has been no fevers,  Chills, nausea or vomiting, also no complaint of difficulty swallowing,  Although chewing makes pain worse.  The patient has tried aleve and warm compresses without relief of symptoms.      Patient is a 46 y.o. female presenting with tooth pain. The history is provided by the patient.  Dental PainPrimary symptoms do not include fever, shortness of breath or sore throat.  Additional symptoms do not include: facial swelling.    History reviewed. No pertinent past medical history.  Past Surgical History  Procedure Date  . Tubal ligation     History reviewed. No pertinent family history.  History  Substance Use Topics  . Smoking status: Never Smoker   . Smokeless tobacco: Not on file  . Alcohol Use: No    OB History    Grav Para Term Preterm Abortions TAB SAB Ect Mult Living                  Review of Systems  Constitutional: Negative for fever.  HENT: Positive for dental problem. Negative for sore throat, facial swelling, neck pain and neck stiffness.   Respiratory: Negative for shortness of breath.     Allergies  Hydrocodone  Home Medications   Current Outpatient Rx  Name Route Sig Dispense Refill  . NAPROXEN 500 MG PO TABS Oral Take 1 tablet (500 mg total) by mouth 2 (two) times daily. 14 tablet 0  . NAPROXEN SODIUM 220 MG PO TABS Oral Take 220 mg by mouth 2 (two) times daily with a meal. For pain      BP 135/60  Pulse 68  Temp 98.4 F (36.9 C) (Oral)  Resp  18  Ht 5\' 4"  (1.626 m)  Wt 236 lb (107.049 kg)  BMI 40.51 kg/m2  SpO2 100%  LMP 01/25/2012  Physical Exam  Constitutional: She is oriented to person, place, and time. She appears well-developed and well-nourished. No distress.  HENT:  Head: Normocephalic and atraumatic. No trismus in the jaw.  Right Ear: Tympanic membrane and external ear normal.  Left Ear: Tympanic membrane and external ear normal.  Mouth/Throat: Oropharynx is clear and moist and mucous membranes are normal. No oral lesions. Dental abscesses present. No posterior oropharyngeal edema.         No induration of subungual space.  Eyes: Conjunctivae are normal.  Neck: Normal range of motion. Neck supple.  Cardiovascular: Normal rate and normal heart sounds.   Pulmonary/Chest: Effort normal.  Abdominal: She exhibits no distension.  Musculoskeletal: Normal range of motion.  Lymphadenopathy:    She has no cervical adenopathy.  Neurological: She is alert and oriented to person, place, and time.  Skin: Skin is warm and dry. No erythema.  Psychiatric: She has a normal mood and affect.    ED Course  Procedures (including critical care time)  Labs Reviewed - No data to display No results found.   1. Dental abscess    Pen VK 500 mg by mouth and Percocet given  prior to discharge   MDM  Prescribed Pen-Vee K and Percocet.  Patient has a Education officer, community in [place] whom she plans to contact today.        Burgess Amor, [place] 05/13/12 1131

## 2012-05-13 NOTE — ED Provider Notes (Signed)
Medical screening examination/treatment/procedure(s) were performed by non-physician practitioner and as supervising physician I was immediately available for consultation/collaboration.   Aracelys Glade, MD 05/13/12 1528 

## 2012-05-13 NOTE — ED Notes (Signed)
Pt c/o left lower toothache since Sunday. Has swelling to left jaw since yesterday.

## 2012-05-13 NOTE — Discharge Instructions (Signed)
Abscessed Tooth A tooth abscess is a collection of infected fluid (pus) from a bacterial infection in the inner part of the tooth (pulp). It usually occurs at the end of the tooth's root.  CAUSES   A very bad cavity (extensive tooth decay).   Trauma to the tooth, such as a broken or chipped tooth, that allows bacteria to enter into the pulp.  SYMPTOMS  Severe pain in and around the infected tooth.   Swelling and redness around the abscessed tooth or in the mouth or face.   Tenderness.   Pus drainage.   Bad breath.   Bitter taste in the mouth.   Difficulty swallowing.   Difficulty opening the mouth.   Feeling sick to your stomach (nauseous).   Vomiting.   Chills.   Swollen neck glands.  DIAGNOSIS  A medical and dental history will be taken.   An examination will be performed by tapping on the abscessed tooth.   X-rays may be taken of the tooth to identify the abscess.  TREATMENT The goal of treatment is to eliminate the infection.   You may be prescribed antibiotic medicine to stop the infection from spreading.   A root canal may be performed to save the tooth. If the tooth cannot be saved, it may be pulled (extracted) and the abscess may be drained.  HOME CARE INSTRUCTIONS  Only take over-the-counter or prescription medicines for pain, fever, or discomfort as directed by your caregiver.   Do not drive after taking pain medicine (narcotics).   Rinse your mouth (gargle) often with salt water ( tsp salt in 8 oz of warm water) to relieve pain or swelling.   Do not apply heat to the outside of your face.   Return to your dentist for further treatment as directed.  SEEK IMMEDIATE DENTAL CARE IF:  You have a temperature by mouth above 102 F (38.9 C), not controlled by medicine.   You have chills or a very bad headache.   You have problems breathing or swallowing.   Your have trouble opening your mouth.   You develop swelling in the neck or around the eye.    Your pain is not helped by medicine.   Your pain is getting worse instead of better.  Document Released: 10/29/2005 Document Revised: 10/18/2011 Document Reviewed: 02/06/2011 [facility] Patient Information 2012 [place], [place].   Complete your entire course of antibiotics as prescribed.  You  may use the percocet  for pain relief but do not drive within 4 hours of taking as this will make you drowsy.  Avoid applying heat or ice to this abscess area which can worsen your symptoms.  You may use warm salt water swish and spit treatment or half peroxide and water swish and spit after meals to keep this area clean as discussed.  Call the dentist listed above for further management of your symptoms.

## 2012-12-27 ENCOUNTER — Emergency Department (HOSPITAL_COMMUNITY)
Admission: EM | Admit: 2012-12-27 | Discharge: 2012-12-27 | Disposition: A | Discharge status: Home / Self Care | Payer: Self-pay | Attending: Emergency Medicine | Admitting: Emergency Medicine

## 2012-12-27 ENCOUNTER — Encounter (HOSPITAL_COMMUNITY): Payer: Self-pay | Admitting: Emergency Medicine

## 2012-12-27 ENCOUNTER — Emergency Department (HOSPITAL_COMMUNITY): Payer: Self-pay

## 2012-12-27 DIAGNOSIS — Z791 Long term (current) use of non-steroidal anti-inflammatories (NSAID): Secondary | ICD-10-CM | POA: Insufficient documentation

## 2012-12-27 DIAGNOSIS — M545 Low back pain, unspecified: Secondary | ICD-10-CM | POA: Insufficient documentation

## 2012-12-27 DIAGNOSIS — J3489 Other specified disorders of nose and nasal sinuses: Secondary | ICD-10-CM | POA: Insufficient documentation

## 2012-12-27 MED ORDER — CYCLOBENZAPRINE HCL 10 MG PO TABS: 10.0000 mg | ORAL_TABLET | Freq: Two times a day (BID) | ORAL | Status: DC | PRN

## 2012-12-27 NOTE — ED Provider Notes (Signed)
Medical screening examination/treatment/procedure(s) were performed by non-physician practitioner and as supervising physician I was immediately available for consultation/collaboration. Odella Appelhans, MD, FACEP   Jerrad Mendibles L Yaret Hush, MD 12/27/12 1627 

## 2012-12-27 NOTE — ED Provider Notes (Signed)
History     CSN: [id number]  Arrival date & time 12/27/12  1114   First MD Initiated Contact with Patient 12/27/12 1334      Chief Complaint  Patient presents with  . Back Pain   HPI Erica Greer is a 47 y.o. female who presents to the ED with back pain. The pain started last night at work. The pain is located across the lower back. She rates the pain as 10/10 at its worst and today is 8/10. She denies loss of control of bladder or bowels. Denies numbness of tingling. She has had similar pain before. The pain does not radiate.  The history was provided by the patient.  History reviewed. No pertinent past medical history.  Past Surgical History  Procedure Laterality Date  . Tubal ligation      No family history on file.  History  Substance Use Topics  . Smoking status: Never Smoker   . Smokeless tobacco: Not on file  . Alcohol Use: No    OB History   Grav Para Term Preterm Abortions TAB SAB Ect Mult Living                  Review of Systems  Constitutional: Negative for fever, chills, diaphoresis and fatigue.  HENT: Positive for congestion. Negative for ear pain, sore throat, facial swelling, neck pain, neck stiffness, dental problem and sinus pressure.   Eyes: Negative for photophobia, pain and discharge.  Respiratory: Negative for cough, chest tightness and wheezing.   Cardiovascular: Negative for chest pain and palpitations.  Gastrointestinal: Negative for nausea, vomiting, abdominal pain, diarrhea, constipation and abdominal distention.  Genitourinary: Negative for dysuria, frequency, flank pain and difficulty urinating.  Musculoskeletal: Positive for back pain. Negative for myalgias and gait problem.  Skin: Negative for color change and rash.  Neurological: Negative for dizziness, speech difficulty, weakness, light-headedness, numbness and headaches.  Psychiatric/Behavioral: Negative for confusion and agitation. The patient is not nervous/anxious.      Allergies  Hydrocodone  Home Medications   Current Outpatient Rx  Name  Route  Sig  Dispense  Refill  . naproxen sodium (ALEVE) 220 MG tablet   Oral   Take 220 mg by mouth 2 (two) times daily with a meal. For pain         . naproxen (NAPROSYN) 500 MG tablet   Oral   Take 1 tablet (500 mg total) by mouth 2 (two) times daily.   14 tablet   0     BP 94/56  Pulse 72  Temp(Src) 97.9 F (36.6 C)  Resp 20  Ht 5\' 3"  (1.6 m)  Wt 250 lb (113.399 kg)  BMI 44.3 kg/m2  SpO2 99%  LMP 11/26/2012  Physical Exam  Nursing note and vitals reviewed. Constitutional: She is oriented to person, place, and time. She appears well-developed and well-nourished.  HENT:  Head: Normocephalic and atraumatic.  Eyes: EOM are normal. Pupils are equal, round, and reactive to light.  Neck: Neck supple.  Cardiovascular: Normal rate and regular rhythm.   Pulmonary/Chest: Effort normal and breath sounds normal. No respiratory distress.  Abdominal: Soft. There is no tenderness.  Musculoskeletal:       Lumbar back: She exhibits decreased range of motion, tenderness and spasm.       Back:  Pedal pulses present and equal bilateral. Adequate circulation, good touch sensation.  Neurological: She is alert and oriented to person, place, and time. She has normal strength and normal reflexes. No cranial  nerve deficit or sensory deficit. Coordination and gait normal.  No foot drop  Skin: Skin is warm and dry.  Psychiatric: She has a normal mood and affect. Her behavior is normal. Judgment and thought content normal.    ED Course  Procedures Labs Reviewed - No data to display Dg Lumbar Spine Complete  12/27/2012  *RADIOLOGY REPORT*  Clinical Data: Low back pain.  LUMBAR SPINE - COMPLETE 4+ VIEW  Comparison: 09/03/2008  Findings: Degenerative facet disease from L3-4 through L5-S1 with bony overgrowth and sclerosis.  Disc spaces are maintained.  Normal alignment.  No fracture. SI joints are symmetric and  unremarkable.  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  No acute findings.   Original Report Authenticated By: Charlett Nose, M.D.    Assessment: 47 y.o. female with low back pain  Plan:  Flexeril    Aleve  Discussed with the patient and all questioned fully answered.   Medication List    TAKE these medications       cyclobenzaprine 10 MG tablet  Commonly known as:  FLEXERIL  Take 1 tablet (10 mg total) by mouth 2 (two) times daily as needed for muscle spasms.      ASK your doctor about these medications       ALEVE 220 MG tablet  Generic drug:  naproxen sodium  Take 220 mg by mouth 2 (two) times daily with a meal. For pain     naproxen 500 MG tablet  Commonly known as:  NAPROSYN  Take 1 tablet (500 mg total) by mouth 2 (two) times daily.         [facility] Orlene Och, [place] 12/27/12 [phone]

## 2012-12-27 NOTE — ED Notes (Signed)
Pt c/o lower back pain since last night. Denies injury 

## 2013-10-29 ENCOUNTER — Encounter (HOSPITAL_COMMUNITY): Payer: Self-pay | Admitting: Emergency Medicine

## 2013-10-29 ENCOUNTER — Emergency Department (HOSPITAL_COMMUNITY)
Admission: EM | Admit: 2013-10-29 | Discharge: 2013-10-29 | Disposition: A | Discharge status: Home / Self Care | Payer: Self-pay | Attending: Emergency Medicine | Admitting: Emergency Medicine

## 2013-10-29 DIAGNOSIS — J329 Chronic sinusitis, unspecified: Secondary | ICD-10-CM | POA: Insufficient documentation

## 2013-10-29 DIAGNOSIS — J069 Acute upper respiratory infection, unspecified: Secondary | ICD-10-CM | POA: Insufficient documentation

## 2013-10-29 MED ORDER — PREDNISONE 50 MG PO TABS
60.0000 mg | ORAL_TABLET | Freq: Once | ORAL | Status: AC
  Administered 2013-10-29: 60 mg via ORAL
  Filled 2013-10-29 (×2): qty 1

## 2013-10-29 MED ORDER — PSEUDOEPHEDRINE HCL 60 MG PO TABS
60.0000 mg | ORAL_TABLET | Freq: Once | ORAL | Status: AC
  Administered 2013-10-29: 60 mg via ORAL
  Filled 2013-10-29: qty 1

## 2013-10-29 MED ORDER — AMOXICILLIN 500 MG PO CAPS: 500.0000 mg | ORAL_CAPSULE | Freq: Three times a day (TID) | ORAL | Status: DC

## 2013-10-29 MED ORDER — PREDNISONE (PAK) 10 MG PO TABS: ORAL_TABLET | Freq: Every day | ORAL | Status: DC

## 2013-10-29 MED ORDER — AMOXICILLIN 250 MG PO CAPS
500.0000 mg | ORAL_CAPSULE | Freq: Once | ORAL | Status: AC
  Administered 2013-10-29: 500 mg via ORAL
  Filled 2013-10-29: qty 2

## 2013-10-29 MED ORDER — LORATADINE-PSEUDOEPHEDRINE ER 5-120 MG PO TB12: 1.0000 | ORAL_TABLET | Freq: Two times a day (BID) | ORAL | Status: DC

## 2013-10-29 NOTE — ED Provider Notes (Signed)
CSN: [id number]     Arrival date & time 10/29/13  1847 History   First MD Initiated Contact with Patient 10/29/13 1933     No chief complaint on file.  (Consider location/radiation/quality/duration/timing/severity/associated sxs/prior Treatment) HPI Comments: Patient presents to the emergency department with complaint of sinus congestion and I can't breathe through my nose. Patient states that for the past 3-4 days she's been bothered with sore throat, congestion, feeling hot and then cold. The patient states she has not been blowing any bloody mucus from the nose. She's not had any high fever reported. There's been no unusual rash noted. Is been no vomiting, or diarrhea. There's been no constipation. Patient has tried some over-the-counter medications without success.  The history is provided by the patient.    History reviewed. No pertinent past medical history. Past Surgical History  Procedure Laterality Date  . Tubal ligation     History reviewed. No pertinent family history. History  Substance Use Topics  . Smoking status: Never Smoker   . Smokeless tobacco: Not on file  . Alcohol Use: No   OB History   Grav Para Term Preterm Abortions TAB SAB Ect Mult Living                 Review of Systems  Constitutional: Positive for chills. Negative for activity change.       All ROS Neg except as noted in HPI  HENT: Positive for congestion, postnasal drip, sinus pressure and sore throat. Negative for nosebleeds.   Eyes: Negative for photophobia and discharge.  Respiratory: Negative for cough, shortness of breath and wheezing.   Cardiovascular: Negative for chest pain and palpitations.  Gastrointestinal: Negative for abdominal pain and blood in stool.  Genitourinary: Negative for dysuria, frequency and hematuria.  Musculoskeletal: Negative for arthralgias, back pain and neck pain.  Skin: Negative.   Neurological: Negative for dizziness, seizures and speech difficulty.   Psychiatric/Behavioral: Negative for hallucinations and confusion.    Allergies  Hydrocodone  Home Medications   Current Outpatient Rx  Name  Route  Sig  Dispense  Refill  . Chlorphen-Pseudoephed-APAP (FLU/COLD MEDICINE PO)   Oral   Take 1 tablet by mouth daily as needed (for flu symptoms).         . Phenyleph-CPM-DM-Aspirin (ALKA-SELTZER PLUS COLD & COUGH) 7.06-13-09-325 MG TBEF   Oral   Take 1 tablet by mouth daily as needed.          BP 145/73  Pulse 66  Temp(Src) 98.5 F (36.9 C) (Oral)  Resp 18  Ht 5\' 4"  (1.626 m)  Wt 275 lb (124.739 kg)  BMI 47.18 kg/m2  SpO2 100%  LMP 10/24/2013 Physical Exam  Nursing note and vitals reviewed. Constitutional: She is oriented to person, place, and time. She appears well-developed and well-nourished.  Non-toxic appearance.  HENT:  Head: Normocephalic.  Right Ear: Tympanic membrane and external ear normal.  Left Ear: Tympanic membrane and external ear normal.  Mild increased redness of the posterior pharynx.  Nasal congestion present.  Eyes: EOM and lids are normal. Pupils are equal, round, and reactive to light.  Neck: Normal range of motion. Neck supple. Carotid bruit is not present.  Cardiovascular: Normal rate, regular rhythm, normal heart sounds, intact distal pulses and normal pulses.   Pulmonary/Chest: Breath sounds normal. No respiratory distress.  Abdominal: Soft. Bowel sounds are normal. There is no tenderness. There is no guarding.  Musculoskeletal: Normal range of motion.  Lymphadenopathy:       Head (  right side): No submandibular adenopathy present.       Head (left side): No submandibular adenopathy present.    She has no cervical adenopathy.  Neurological: She is alert and oriented to person, place, and time. She has normal strength. No cranial nerve deficit or sensory deficit.  Skin: Skin is warm and dry.  Psychiatric: She has a normal mood and affect. Her speech is normal.    ED Course  Procedures  (including critical care time) Labs Review Labs Reviewed - No data to display Imaging Review No results found.  EKG Interpretation   None       MDM  No diagnosis found. *I have reviewed nursing notes, vital signs, and all appropriate lab and imaging results for this patient.  Vital signs are well within normal limits. Pulse oximetry 100% on room air. Within normal limits by my interpretation.  Examination is consistent with sinusitis and upper respiratory infection. Patient advised to increase fluids, wash hands frequently. Prescription for amoxicillin, Claritin-D 12 hour, and prednisone given to the patient. Patient is to see her primary physician, or return to the emergency department if not improving.  Kathie Dike, PA-C 10/29/13 2017

## 2013-10-29 NOTE — ED Notes (Signed)
Sinus congestion, cough, "cold then hot", sore throat.

## 2013-10-31 NOTE — ED Provider Notes (Signed)
Medical screening examination/treatment/procedure(s) were performed by non-physician practitioner and as supervising physician I was immediately available for consultation/collaboration.   Kip Cropp L Angell Honse, MD 10/31/13 1157 

## 2013-12-08 ENCOUNTER — Encounter (HOSPITAL_COMMUNITY): Payer: Self-pay | Admitting: Emergency Medicine

## 2013-12-08 ENCOUNTER — Emergency Department (HOSPITAL_COMMUNITY)
Admission: EM | Admit: 2013-12-08 | Discharge: 2013-12-08 | Disposition: A | Discharge status: Home / Self Care | Payer: BC Managed Care – PPO | Attending: Emergency Medicine | Admitting: Emergency Medicine

## 2013-12-08 DIAGNOSIS — H9190 Unspecified hearing loss, unspecified ear: Secondary | ICD-10-CM | POA: Insufficient documentation

## 2013-12-08 DIAGNOSIS — K047 Periapical abscess without sinus: Secondary | ICD-10-CM | POA: Insufficient documentation

## 2013-12-08 DIAGNOSIS — G510 Bell's palsy: Secondary | ICD-10-CM | POA: Insufficient documentation

## 2013-12-08 DIAGNOSIS — K029 Dental caries, unspecified: Secondary | ICD-10-CM | POA: Insufficient documentation

## 2013-12-08 MED ORDER — VALACYCLOVIR HCL 1 G PO TABS: 1000.0000 mg | ORAL_TABLET | Freq: Three times a day (TID) | ORAL | Status: AC

## 2013-12-08 MED ORDER — ARTIFICIAL TEARS OP OINT: TOPICAL_OINTMENT | OPHTHALMIC | Status: DC | PRN

## 2013-12-08 MED ORDER — PENICILLIN V POTASSIUM 500 MG PO TABS: 500.0000 mg | ORAL_TABLET | Freq: Four times a day (QID) | ORAL | Status: DC

## 2013-12-08 MED ORDER — PREDNISONE 20 MG PO TABS: 60.0000 mg | ORAL_TABLET | Freq: Every day | ORAL | Status: DC

## 2013-12-08 NOTE — Discharge Instructions (Signed)
Dental Abscess A dental abscess is a collection of infected fluid (pus) from a bacterial infection in the inner part of the tooth (pulp). It usually occurs at the end of the tooth's root.  CAUSES   Severe tooth decay.  Trauma to the tooth that allows bacteria to enter into the pulp, such as a broken or chipped tooth. SYMPTOMS   Severe pain in and around the infected tooth.  Swelling and redness around the abscessed tooth or in the mouth or face.  Tenderness.  Pus drainage.  Bad breath.  Bitter taste in the mouth.  Difficulty swallowing.  Difficulty opening the mouth.  Nausea.  Vomiting.  Chills.  Swollen neck glands. DIAGNOSIS   A medical and dental history will be taken.  An examination will be performed by tapping on the abscessed tooth.  X-rays may be taken of the tooth to identify the abscess. TREATMENT The goal of treatment is to eliminate the infection. You may be prescribed antibiotic medicine to stop the infection from spreading. A root canal may be performed to save the tooth. If the tooth cannot be saved, it may be pulled (extracted) and the abscess may be drained.  HOME CARE INSTRUCTIONS  Only take over-the-counter or prescription medicines for pain, fever, or discomfort as directed by your caregiver.  Rinse your mouth (gargle) often with salt water ( tsp salt in 8 oz [250 ml] of warm water) to relieve pain or swelling.  Do not drive after taking pain medicine (narcotics).  Do not apply heat to the outside of your face.  Return to your dentist for further treatment as directed. SEEK MEDICAL CARE IF:  Your pain is not helped by medicine.  Your pain is getting worse instead of better. SEEK IMMEDIATE MEDICAL CARE IF:  You have a fever or persistent symptoms for more than 2 3 days.  You have a fever and your symptoms suddenly get worse.  You have chills or a very bad headache.  You have problems breathing or swallowing.  You have trouble  opening your mouth.  You have swelling in the neck or around the eye. Document Released: 10/29/2005 Document Revised: 07/23/2012 Document Reviewed: 02/06/2011 [facility] Patient Information 2014 [place], [place].  Bell's Palsy Bell's palsy is a condition in which the muscles on one side of the face cannot move (paralysis). This is because the nerves in the face are paralyzed. It is most often thought to be caused by a virus. The virus causes swelling of the nerve that controls movement on one side of the face. The nerve travels through a tight space surrounded by bone. When the nerve swells, it can be compressed by the bone. This results in damage to the protective covering around the nerve. This damage interferes with how the nerve communicates with the muscles of the face. As a result, it can cause weakness or paralysis of the facial muscles.  Injury (trauma), tumor, and surgery may cause Bell's palsy, but most of the time the cause is unknown. It is a relatively common condition. It starts suddenly (abrupt onset) with the paralysis usually ending within 2 days. Bell's palsy is not dangerous. But because the eye does not close properly, you may need care to keep the eye from getting dry. This can include splinting (to keep the eye shut) or moistening with artificial tears. Bell's palsy very seldom occurs on both sides of the face at the same time. SYMPTOMS   Eyebrow sagging.  Drooping of the eyelid and corner of  the mouth.  Inability to close one eye.  Loss of taste on the front of the tongue.  Sensitivity to loud noises. TREATMENT  The treatment is usually non-surgical. If the patient is seen within the first 24 to 48 hours, a short course of steroids may be prescribed, in an attempt to shorten the length of the condition. Antiviral medicines may also be used with the steroids, but it is unclear if they are helpful.  You will need to protect your eye, if you cannot close it. The cornea (clear  covering over your eye) will become dry and can be damaged. Artificial tears can be used to keep your eye moist. Glasses or an eye patch should be worn to protect your eye. PROGNOSIS  Recovery is variable, ranging from days to months. Although the problem usually goes away completely (about 80% of cases resolve), predicting the outcome is impossible. Most people improve within 3 weeks of when the symptoms began. Improvement may continue for 3 to 6 months. A small number of people have moderate to severe weakness that is permanent.  HOME CARE INSTRUCTIONS   If your caregiver prescribed medication to reduce swelling in the nerve, use as directed. Do not stop taking the medication unless directed by your caregiver.  Use moisturizing eye drops as needed to prevent drying of your eye, as directed by your caregiver.  Protect your eye, as directed by your caregiver.  Use facial massage and exercises, as directed by your caregiver.  Perform your normal activities, and get your normal rest. SEEK IMMEDIATE MEDICAL CARE IF:   There is pain, redness or irritation in the eye.  You or your child has an oral temperature above 102 F (38.9 C), not controlled by medicine. MAKE SURE YOU:   Understand these instructions.  Will watch your condition.  Will get help right away if you are not doing well or get worse. Document Released: 10/29/2005 Document Revised: 01/21/2012 Document Reviewed: 11/07/2009 [facility] Patient Information 2014 [place], [place].

## 2013-12-08 NOTE — ED Provider Notes (Signed)
CSN: [id number]     Arrival date & time 12/08/13  1442 History  This chart was scribed for [employer]. Alvino Chapel, MD by Eugenia Mcalpine, ED Scribe. This patient was seen in room APA04/APA04 and the patient's care was started at 6:05 PM.    Chief Complaint  Patient presents with  . Dental Pain   (Consider location/radiation/quality/duration/timing/severity/associated sxs/prior Treatment) Patient is a 48 y.o. female presenting with tooth pain. The history is provided by the patient. No language interpreter was used.  Dental Pain Associated symptoms: headaches   Associated symptoms: no fever    HPI Comments: Erica Greer is a 48 y.o. female who presents to the Emergency Department complaining of gradual onset left lower dental pain for 2x days. Pt also complains of a sudden onset right sided facial droop with associated tearing; she is unsure of when the facial droop began.  She cannot raise her right eyebrow or blink her right eye lids.  Pt had a headache 1x day ago. She denies fever.  History reviewed. No pertinent past medical history. Past Surgical History  Procedure Laterality Date  . Tubal ligation    . Tubal ligation     No family history on file. History  Substance Use Topics  . Smoking status: Never Smoker   . Smokeless tobacco: Not on file  . Alcohol Use: No   OB History   Grav Para Term Preterm Abortions TAB SAB Ect Mult Living                 Review of Systems  Constitutional: Negative for fever.  HENT: Positive for dental problem. Negative for ear pain.   Eyes: Negative for pain and visual disturbance.  Neurological: Positive for facial asymmetry and headaches. Negative for speech difficulty and numbness.  All other systems reviewed and are negative.    Allergies  Hydrocodone  Home Medications   Current Outpatient Rx  Name  Route  Sig  Dispense  Refill  . acetaminophen (TYLENOL) 500 MG tablet   Oral   Take 500 mg by mouth 2 (two) times daily as needed for  moderate pain.         . naphazoline-glycerin (CLEAR EYES) 0.012-0.2 % SOLN   Right Eye   Place 2 drops into the right eye daily as needed for irritation.         . penicillin v potassium (VEETID) 500 MG tablet   Oral   Take 1 tablet (500 mg total) by mouth 4 (four) times daily.   20 tablet   0   . predniSONE (DELTASONE) 20 MG tablet   Oral   Take 3 tablets (60 mg total) by mouth daily.   18 tablet   0   . valACYclovir (VALTREX) 1000 MG tablet   Oral   Take 1 tablet (1,000 mg total) by mouth 3 (three) times daily.   21 tablet   0    BP 134/80  Pulse 80  Temp(Src) 98.8 F (37.1 C) (Oral)  Resp 18  Ht 5\' 3"  (1.6 m)  Wt 250 lb (113.399 kg)  BMI 44.30 kg/m2  SpO2 100%  LMP 11/10/2013 Physical Exam  Nursing note and vitals reviewed. Constitutional: She is oriented to person, place, and time. She appears well-developed and well-nourished. No distress.  HENT:  Head: Normocephalic and atraumatic.  Right Ear: Hearing and tympanic membrane normal.  Left Ear: Tympanic membrane normal. Decreased hearing is noted.  left lower 1st molar larger cavity, no fluctuance, no gingival swelling  Eyes: EOM are normal.  Neck: Neck supple. No tracheal deviation present.  Cardiovascular: Normal rate.   Pulmonary/Chest: Effort normal. No respiratory distress.  Musculoskeletal: Normal range of motion.  Lymphadenopathy:    She has no cervical adenopathy.  Neurological: She is alert and oriented to person, place, and time. She has normal strength. A cranial nerve deficit is present. No sensory deficit. She exhibits normal muscle tone. Coordination and gait normal.  Right sided facial droop not able to raise rightr eybrow or close right eye completelty. sensation intact  Skin: Skin is warm and dry.  Psychiatric: She has a normal mood and affect. Her behavior is normal.    ED Course  Procedures (including critical care time)  Results for orders placed during the hospital encounter of  07/09/11  GLUCOSE, CAPILLARY      Result Value Range   Glucose-Capillary 98  70 - 99 mg/dL   No results found.  6:07 PM- Pt advised of plan for treatment including advising pt of possible bells palsy, and steroid with antibiotics and pt agrees.  Labs Review Labs Reviewed - No data to display Imaging Review No results found.  EKG Interpretation   None       MDM   1. Bell's palsy   2. Dental abscess    Patient with left-sided dental abscess. Nothing drainable. States she has dental followup. Also has right-sided Bell's palsy. No pain. No ear or face vesicles. She has a rather serious droop and will be treated with steroids and antivirals.  I personally performed the services described in this documentation, which was scribed in my presence. The recorded information has been reviewed and is accurate.     Jasper Riling. Alvino Chapel, MD 12/08/13 [phone]

## 2013-12-08 NOTE — ED Notes (Signed)
Sx discussed with Dr. Tomi Bamberger - no orders for CT given.

## 2013-12-08 NOTE — ED Notes (Signed)
Pt here for dental pain to left side of mouth x 2 days.  Upon assessment pt has right sided facial droop, denies numbness to face.  Denies weakness/slurred speech.  Pt unable to close right eye completely.  States noticed x 2 days ago that her right eye was watering.  Equal bil grip strength, neg arm drift.

## 2013-12-22 ENCOUNTER — Other Ambulatory Visit (HOSPITAL_COMMUNITY): Payer: Self-pay | Admitting: Family Medicine

## 2013-12-22 DIAGNOSIS — Z139 Encounter for screening, unspecified: Secondary | ICD-10-CM

## 2014-01-05 ENCOUNTER — Ambulatory Visit (HOSPITAL_COMMUNITY): Payer: Self-pay

## 2014-01-08 ENCOUNTER — Ambulatory Visit (HOSPITAL_COMMUNITY)
Admission: RE | Admit: 2014-01-08 | Discharge: 2014-01-08 | Disposition: A | Discharge status: Home / Self Care | Payer: BC Managed Care – PPO | Source: Ambulatory Visit | Attending: Family Medicine | Admitting: Family Medicine

## 2014-01-08 ENCOUNTER — Telehealth (HOSPITAL_COMMUNITY): Payer: Self-pay | Admitting: Dietician

## 2014-01-08 DIAGNOSIS — Z139 Encounter for screening, unspecified: Secondary | ICD-10-CM

## 2014-01-08 DIAGNOSIS — Z1231 Encounter for screening mammogram for malignant neoplasm of breast: Secondary | ICD-10-CM | POA: Insufficient documentation

## 2014-01-08 NOTE — Telephone Encounter (Signed)
Received fax from Levada Dy at [facility] for referral. Dx: obesity, prediabetes. Faxed appointment time back to [facility] per her request at 1130. Pt scheduled for Monday, March 30th at 10:00 AM.

## 2014-02-08 ENCOUNTER — Telehealth (HOSPITAL_COMMUNITY): Payer: Self-pay | Admitting: Dietician

## 2014-02-08 NOTE — Telephone Encounter (Signed)
Pt was a no-show for appointment scheduled for 02/08/2014 at 1000.

## 2014-02-11 ENCOUNTER — Ambulatory Visit (HOSPITAL_COMMUNITY)
Admission: RE | Admit: 2014-02-11 | Discharge: 2014-02-11 | Disposition: A | Discharge status: Home / Self Care | Payer: BC Managed Care – PPO | Source: Ambulatory Visit | Attending: Family Medicine | Admitting: Family Medicine

## 2014-02-11 ENCOUNTER — Other Ambulatory Visit (HOSPITAL_COMMUNITY): Payer: Self-pay | Admitting: Family Medicine

## 2014-02-11 DIAGNOSIS — M1990 Unspecified osteoarthritis, unspecified site: Secondary | ICD-10-CM

## 2014-02-11 DIAGNOSIS — M25561 Pain in right knee: Secondary | ICD-10-CM

## 2014-02-11 DIAGNOSIS — M25569 Pain in unspecified knee: Secondary | ICD-10-CM | POA: Insufficient documentation

## 2014-12-02 ENCOUNTER — Other Ambulatory Visit (HOSPITAL_COMMUNITY): Payer: Self-pay | Admitting: Nurse Practitioner

## 2014-12-02 DIAGNOSIS — Z1231 Encounter for screening mammogram for malignant neoplasm of breast: Secondary | ICD-10-CM

## 2015-01-10 ENCOUNTER — Ambulatory Visit (HOSPITAL_COMMUNITY)
Admission: RE | Admit: 2015-01-10 | Discharge: 2015-01-10 | Disposition: A | Discharge status: Home / Self Care | Payer: 59 | Source: Ambulatory Visit | Attending: Nurse Practitioner | Admitting: Nurse Practitioner

## 2015-01-10 DIAGNOSIS — Z1231 Encounter for screening mammogram for malignant neoplasm of breast: Secondary | ICD-10-CM | POA: Diagnosis present

## 2015-02-23 ENCOUNTER — Encounter (HOSPITAL_COMMUNITY): Payer: Self-pay | Admitting: *Deleted

## 2015-02-23 ENCOUNTER — Emergency Department (HOSPITAL_COMMUNITY)
Admission: EM | Admit: 2015-02-23 | Discharge: 2015-02-23 | Disposition: A | Discharge status: Home / Self Care | Payer: 59 | Attending: Emergency Medicine | Admitting: Emergency Medicine

## 2015-02-23 DIAGNOSIS — H5712 Ocular pain, left eye: Secondary | ICD-10-CM | POA: Diagnosis present

## 2015-02-23 DIAGNOSIS — H109 Unspecified conjunctivitis: Secondary | ICD-10-CM

## 2015-02-23 DIAGNOSIS — Z7952 Long term (current) use of systemic steroids: Secondary | ICD-10-CM | POA: Insufficient documentation

## 2015-02-23 DIAGNOSIS — Z79899 Other long term (current) drug therapy: Secondary | ICD-10-CM | POA: Diagnosis not present

## 2015-02-23 DIAGNOSIS — Z792 Long term (current) use of antibiotics: Secondary | ICD-10-CM | POA: Diagnosis not present

## 2015-02-23 MED ORDER — TOBRAMYCIN 0.3 % OP SOLN
1.0000 [drp] | Freq: Once | OPHTHALMIC | Status: AC
  Administered 2015-02-23: 1 [drp] via OPHTHALMIC
  Filled 2015-02-23: qty 5

## 2015-02-23 NOTE — ED Provider Notes (Signed)
CSN: [id number]     Arrival date & time 02/23/15  1708 History   First MD Initiated Contact with Patient 02/23/15 1735     Chief Complaint  Patient presents with  . Eye Pain     (Consider location/radiation/quality/duration/timing/severity/associated sxs/prior Treatment) The history is provided by the patient.   Erica Greer is a 49 y.o. female presenting with left eye redness, irritation and drainage which she woke with yesterday morning.  She wakes with eyes crusted closed with yellow to green discharge and has used warm water and wash clothes to clear the discharge. She denies fevers or chills, nasal congestion, sinus pressure, ear pain.  She does have mild left eye photophobia and increased tearing causing some difficulty seeing, but no decreased visual acuity. She does not wear contacts or glasses. Has not been exposed to pink eye to her knowledge.    History reviewed. No pertinent past medical history. Past Surgical History  Procedure Laterality Date  . Tubal ligation    . Tubal ligation     History reviewed. No pertinent family history. History  Substance Use Topics  . Smoking status: Never Smoker   . Smokeless tobacco: Not on file  . Alcohol Use: No   OB History    No data available     Review of Systems  Constitutional: Negative for fever and chills.  HENT: Negative for congestion, ear discharge, ear pain, rhinorrhea, sinus pressure, sore throat, trouble swallowing and voice change.   Eyes: Positive for discharge and redness.  Respiratory: Negative for cough, shortness of breath, wheezing and stridor.   Cardiovascular: Negative for chest pain.  Gastrointestinal: Negative for abdominal pain.  Genitourinary: Negative.       Allergies  Hydrocodone  Home Medications   Prior to Admission medications   Medication Sig Start Date End Date Taking? Authorizing Provider  acetaminophen (TYLENOL) 500 MG tablet Take 500 mg by mouth 2 (two) times daily as needed for  moderate pain.    Historical Provider, MD  artificial tears (LACRILUBE) OINT ophthalmic ointment Place into the right eye every 4 (four) hours as needed for dry eyes. 12/08/13   Davonna Belling, MD  naphazoline-glycerin (CLEAR EYES) 0.012-0.2 % SOLN Place 2 drops into the right eye daily as needed for irritation.    Historical Provider, MD  penicillin v potassium (VEETID) 500 MG tablet Take 1 tablet (500 mg total) by mouth 4 (four) times daily. 12/08/13   Davonna Belling, MD  predniSONE (DELTASONE) 20 MG tablet Take 3 tablets (60 mg total) by mouth daily. 12/08/13   Davonna Belling, MD   BP 128/59 mmHg  Pulse 78  Temp(Src) 98.9 F (37.2 C) (Oral)  Resp 20  Ht 5\' 4"  (1.626 m)  Wt 277 lb (125.646 kg)  BMI 47.52 kg/m2  SpO2 100%  LMP 02/11/2015 Physical Exam  Constitutional: She is oriented to person, place, and time. She appears well-developed and well-nourished.  HENT:  Head: Normocephalic and atraumatic.  Right Ear: Tympanic membrane and ear canal normal.  Left Ear: Tympanic membrane and ear canal normal.  Nose: Mucosal edema and rhinorrhea present.  Mouth/Throat: Uvula is midline, oropharynx is clear and moist and mucous membranes are normal. No oropharyngeal exudate, posterior oropharyngeal edema, posterior oropharyngeal erythema or tonsillar abscesses.  Eyes: EOM are normal. Pupils are equal, round, and reactive to light. Right eye exhibits no chemosis. Left eye exhibits discharge. Left eye exhibits no chemosis. Left conjunctiva is injected. Left eye exhibits normal extraocular motion and no nystagmus.  Visual Acuity - Bilateral Near: 20 25 ; R Near: 20 30 ; L Near: 20 40  Yellow crusting along eyelashes.    Cardiovascular: Normal rate and normal heart sounds.   Pulmonary/Chest: Effort normal.  Musculoskeletal: Normal range of motion.  Neurological: She is alert and oriented to person, place, and time.  Skin: Skin is warm and dry.  Psychiatric: She has a normal mood and affect.     ED Course  Procedures (including critical care time) Labs Review Labs Reviewed - No data to display  Imaging Review No results found.   EKG Interpretation None      MDM   Final diagnoses:  Conjunctivitis of left eye    tobrex given, first dose here. Prn f/u with pcp for worsened or persistent sx.      Evalee Jefferson, PA-C 02/23/15 [place], MD 02/24/15 [phone]

## 2015-02-23 NOTE — ED Notes (Signed)
Pain, redness with d/c to lt eye

## 2015-02-23 NOTE — Discharge Instructions (Signed)
Conjunctivitis Conjunctivitis is commonly called "pink eye." Conjunctivitis can be caused by bacterial or viral infection, allergies, or injuries. There is usually redness of the lining of the eye, itching, discomfort, and sometimes discharge. There may be deposits of matter along the eyelids. A viral infection usually causes a watery discharge, while a bacterial infection causes a yellowish, thick discharge. Pink eye is very contagious and spreads by direct contact. You may be given antibiotic eyedrops as part of your treatment. Before using your eye medicine, remove all drainage from the eye by washing gently with warm water and cotton balls. Continue to use the medication until you have awakened 2 mornings in a row without discharge from the eye. Do not rub your eye. This increases the irritation and helps spread infection. Use separate towels from other household members. Wash your hands with soap and water before and after touching your eyes. Use cold compresses to reduce pain and sunglasses to relieve irritation from light. Do not wear contact lenses or wear eye makeup until the infection is gone. SEEK MEDICAL CARE IF:   Your symptoms are not better after 3 days of treatment.  You have increased pain or trouble seeing.  The outer eyelids become very red or swollen. Document Released: 12/06/2004 Document Revised: 01/21/2012 Document Reviewed: 10/29/2005 [facility] Patient Information 2015 [place], [place]. This information is not intended to replace advice given to you by your health care provider. Make sure you discuss any questions you have with your health care provider.   Apply 1 drop of the antibiotic given in each eye every 2 hours (while awake) for the next 24 hours, then 1 drop every 4 hours until your symptoms are resolved.

## 2015-08-17 ENCOUNTER — Emergency Department (HOSPITAL_COMMUNITY): Payer: 59

## 2015-08-17 ENCOUNTER — Emergency Department (HOSPITAL_COMMUNITY)
Admission: EM | Admit: 2015-08-17 | Discharge: 2015-08-17 | Disposition: A | Discharge status: Home / Self Care | Payer: 59 | Attending: Emergency Medicine | Admitting: Emergency Medicine

## 2015-08-17 ENCOUNTER — Encounter (HOSPITAL_COMMUNITY): Payer: Self-pay | Admitting: Emergency Medicine

## 2015-08-17 DIAGNOSIS — Z3202 Encounter for pregnancy test, result negative: Secondary | ICD-10-CM | POA: Diagnosis not present

## 2015-08-17 DIAGNOSIS — Z9851 Tubal ligation status: Secondary | ICD-10-CM | POA: Insufficient documentation

## 2015-08-17 DIAGNOSIS — R52 Pain, unspecified: Secondary | ICD-10-CM

## 2015-08-17 DIAGNOSIS — M545 Low back pain: Secondary | ICD-10-CM | POA: Diagnosis not present

## 2015-08-17 DIAGNOSIS — R109 Unspecified abdominal pain: Secondary | ICD-10-CM | POA: Diagnosis present

## 2015-08-17 DIAGNOSIS — Z7952 Long term (current) use of systemic steroids: Secondary | ICD-10-CM | POA: Insufficient documentation

## 2015-08-17 DIAGNOSIS — N83201 Unspecified ovarian cyst, right side: Secondary | ICD-10-CM | POA: Diagnosis not present

## 2015-08-17 DIAGNOSIS — Z792 Long term (current) use of antibiotics: Secondary | ICD-10-CM | POA: Insufficient documentation

## 2015-08-17 DIAGNOSIS — R6883 Chills (without fever): Secondary | ICD-10-CM | POA: Insufficient documentation

## 2015-08-17 LAB — URINALYSIS, ROUTINE W REFLEX MICROSCOPIC
BILIRUBIN URINE: NEGATIVE
GLUCOSE, UA: NEGATIVE mg/dL
HGB URINE DIPSTICK: NEGATIVE
KETONES UR: NEGATIVE mg/dL
Leukocytes, UA: NEGATIVE
Nitrite: NEGATIVE
PROTEIN: NEGATIVE mg/dL
Specific Gravity, Urine: 1.01 (ref 1.005–1.030)
Urobilinogen, UA: 0.2 mg/dL (ref 0.0–1.0)
pH: 6 (ref 5.0–8.0)

## 2015-08-17 LAB — COMPREHENSIVE METABOLIC PANEL
ALBUMIN: 4.1 g/dL (ref 3.5–5.0)
ALT: 24 U/L (ref 14–54)
AST: 22 U/L (ref 15–41)
Alkaline Phosphatase: 48 U/L (ref 38–126)
Anion gap: 5 (ref 5–15)
BUN: 9 mg/dL (ref 6–20)
CHLORIDE: 110 mmol/L (ref 101–111)
CO2: 31 mmol/L (ref 22–32)
Calcium: 9 mg/dL (ref 8.9–10.3)
Creatinine, Ser: 0.7 mg/dL (ref 0.44–1.00)
GFR calc Af Amer: >60 mL/min (ref >60)
Glucose, Bld: 98 mg/dL (ref 65–99)
POTASSIUM: 4.3 mmol/L (ref 3.5–5.1)
Sodium: 146 mmol/L — ABNORMAL HIGH (ref 135–145)
Total Bilirubin: 0.4 mg/dL (ref 0.3–1.2)
Total Protein: 7.1 g/dL (ref 6.5–8.1)

## 2015-08-17 LAB — CBC WITH DIFFERENTIAL/PLATELET
BASOS ABS: 0 10*3/uL (ref 0.0–0.1)
BASOS PCT: 0 %
EOS PCT: 2 %
Eosinophils Absolute: 0.2 10*3/uL (ref 0.0–0.7)
HCT: 38.5 % (ref 36.0–46.0)
Hemoglobin: 12.5 g/dL (ref 12.0–15.0)
Lymphocytes Relative: 27 %
Lymphs Abs: 2.6 10*3/uL (ref 0.7–4.0)
MCH: 25.1 pg — ABNORMAL LOW (ref 26.0–34.0)
MCHC: 32.5 g/dL (ref 30.0–36.0)
MCV: 77.3 fL — AB (ref 78.0–100.0)
MONO ABS: 0.8 10*3/uL (ref 0.1–1.0)
Monocytes Relative: 8 %
Neutro Abs: 6.3 10*3/uL (ref 1.7–7.7)
Neutrophils Relative %: 63 %
PLATELETS: 191 10*3/uL (ref 150–400)
RBC: 4.98 MIL/uL (ref 3.87–5.11)
RDW: 13.2 % (ref 11.5–15.5)
WBC: 9.9 10*3/uL (ref 4.0–10.5)

## 2015-08-17 LAB — PREGNANCY, URINE: PREG TEST UR: NEGATIVE

## 2015-08-17 MED ORDER — TRAMADOL HCL 50 MG PO TABS: 50.0000 mg | ORAL_TABLET | Freq: Four times a day (QID) | ORAL | Status: DC | PRN

## 2015-08-17 MED ORDER — OXYCODONE-ACETAMINOPHEN 5-325 MG PO TABS
1.0000 | ORAL_TABLET | Freq: Once | ORAL | Status: AC
  Administered 2015-08-17: 1 via ORAL
  Filled 2015-08-17: qty 1

## 2015-08-17 NOTE — ED Provider Notes (Signed)
CSN: [id number]     Arrival date & time 08/17/15  1006 History  By signing my name below, I, Erica Greer, attest that this documentation has been prepared under the direction and in the presence of Milton Ferguson, MD. Electronically Signed: Terressa Greer, ED Scribe. 08/17/2015. 11:22 AM.  Chief Complaint  Patient presents with  . Flank Pain   Patient is a 49 y.o. female presenting with back pain and flank pain. The history is provided by the patient. No language interpreter was used.  Back Pain Location:  Lumbar spine Quality:  Aching Pain severity:  Severe Onset quality:  Gradual Duration:  1 week Progression:  Worsening Chronicity:  New Context: not recent injury   Relieved by:  Nothing Ineffective treatments:  OTC medications Associated symptoms: no abdominal pain, no chest pain, no dysuria and no headaches   Risk factors: obesity   Flank Pain This is a new problem. The current episode started yesterday. The problem occurs constantly. The problem has not changed since onset.Pertinent negatives include no chest pain, no abdominal pain and no headaches. She has tried nothing for the symptoms.   PCP: Erica Edwards, NP HPI Comments: Erica Greer is a 49 y.o. female, with PMHx noted below, who presents to the Emergency Department complaining of atraumatic, gradually worsening, 10/10 lower back pain onset one week ago. Pt denies any recent injuries, however, pt notes she has to stand for extended periods of time for her job. Pt reports trying OTC meds without relief. Associated Sx, onset yesterday, involve: right flank pain and chills. Pt denies cough, frequency, n/v, dysuria.   History reviewed. No pertinent past medical history. Past Surgical History  Procedure Laterality Date  . Tubal ligation    . Tubal ligation     History reviewed. No pertinent family history. Social History  Substance Use Topics  . Smoking status: Never Smoker   . Smokeless tobacco: None  . Alcohol Use: No    OB History    No data available     Review of Systems  Constitutional: Positive for chills. Negative for appetite change and fatigue.  HENT: Negative for congestion, ear discharge and sinus pressure.   Eyes: Negative for discharge.  Respiratory: Negative for cough.   Cardiovascular: Negative for chest pain.  Gastrointestinal: Negative for nausea, vomiting, abdominal pain and diarrhea.  Genitourinary: Positive for flank pain. Negative for dysuria, frequency and hematuria.  Musculoskeletal: Positive for back pain.  Skin: Negative for rash.  Neurological: Negative for seizures and headaches.  Psychiatric/Behavioral: Negative for hallucinations.   Allergies  Hydrocodone  Home Medications   Prior to Admission medications   Medication Sig Start Date End Date Taking? Authorizing Provider  acetaminophen (TYLENOL) 500 MG tablet Take 500 mg by mouth 2 (two) times daily as needed for moderate pain.    Historical Provider, MD  artificial tears (LACRILUBE) OINT ophthalmic ointment Place into the right eye every 4 (four) hours as needed for dry eyes. 12/08/13   Davonna Belling, MD  naphazoline-glycerin (CLEAR EYES) 0.012-0.2 % SOLN Place 2 drops into the right eye daily as needed for irritation.    Historical Provider, MD  penicillin v potassium (VEETID) 500 MG tablet Take 1 tablet (500 mg total) by mouth 4 (four) times daily. 12/08/13   Davonna Belling, MD  predniSONE (DELTASONE) 20 MG tablet Take 3 tablets (60 mg total) by mouth daily. 12/08/13   Davonna Belling, MD   Triage Vitals: BP 139/60 mmHg  Pulse 66  Temp(Src) 97.8 F (36.6  C) (Oral)  Resp 18  Ht 5\' 4"  (1.626 m)  Wt 297 lb (134.718 kg)  BMI 50.95 kg/m2  SpO2 100% Physical Exam  Constitutional: She is oriented to person, place, and time. She appears well-developed.  HENT:  Head: Normocephalic.  Eyes: Conjunctivae and EOM are normal. No scleral icterus.  Neck: Neck supple. No thyromegaly present.  Cardiovascular: Normal  rate and regular rhythm.  Exam reveals no gallop and no friction rub.   No murmur heard. Pulmonary/Chest: No stridor. She has no wheezes. She has no rales. She exhibits no tenderness.  Abdominal: She exhibits no distension. There is no rebound.     Genitourinary:  Moderate right flank tenderness  Musculoskeletal: Normal range of motion. She exhibits no edema.  Lymphadenopathy:    She has no cervical adenopathy.  Neurological: She is oriented to person, place, and time. She exhibits normal muscle tone. Coordination normal.  Skin: No rash noted. No erythema.  Psychiatric: She has a normal mood and affect. Her behavior is normal.    ED Course  Procedures (including critical care time) DIAGNOSTIC STUDIES: Oxygen Saturation is 100% on RA, nl by my interpretation.    COORDINATION OF CARE: 11:20 AM: Discussed treatment plan which includes UA with pt at bedside; patient verbalizes understanding and agrees with treatment plan.  Labs Review Labs Reviewed  URINALYSIS, ROUTINE W REFLEX MICROSCOPIC (NOT AT [facility])  I have personally reviewed and evaluated these lab results as part of my medical decision-making.  MDM   Final diagnoses:  None    Flank pain with unremarkable urine and blood work. CT scan of the abdomen shows 3.5 cm ovarian mass. We'll prescribe patient's ultrasound and have her follow-up with her OB/GYN doctor  The chart was scribed for me under my direct supervision.  I personally performed the history, physical, and medical decision making and all procedures in the evaluation of this patient.Milton Ferguson, MD 08/17/15 [phone]

## 2015-08-17 NOTE — ED Notes (Signed)
Pt states that she has been having lower back pain for about a week.  Started having right flank pain and chills last night.

## 2015-08-17 NOTE — Discharge Instructions (Signed)
Follow up with dr. Ferguson in one week. °

## 2015-08-17 NOTE — ED Notes (Signed)
Pt c/o lower back pain x 1 week with no relief from otc medication. Pt reports right flank pain and chills since yesterday. Denies gu/gi sx. Denies injury. nad noted.

## 2015-08-30 ENCOUNTER — Ambulatory Visit (INDEPENDENT_AMBULATORY_CARE_PROVIDER_SITE_OTHER): Payer: 59 | Admitting: Obstetrics and Gynecology

## 2015-08-30 ENCOUNTER — Encounter: Payer: Self-pay | Admitting: Obstetrics and Gynecology

## 2015-08-30 VITALS — BP 130/78 | Ht 64.0 in | Wt 289.0 lb

## 2015-08-30 DIAGNOSIS — N951 Menopausal and female climacteric states: Secondary | ICD-10-CM

## 2015-08-30 NOTE — Progress Notes (Signed)
Patient ID: AADHYA BUSTAMANTE, female   DOB: [birth year], 49 y.o.   MRN: [id number]   [facility] Visit  Patient name: Erica Greer MRN [id number]  Date of birth: Mar 31, 1966  CC & HPI:  Erica Greer is a 49 y.o. female presenting today for ED followup from 10/5 visit with rt sided pain. No LMP recorded. Patient is not currently having periods (Reason: Perimenopausal).no menses in =6+ ,months.  CT showed a 3.5 cm rt ov ?cyst. Will need pelvic u/s  ROS:  Pain today is a 3  Pain was a 10 in ED on 10//5 pt had resolution with medicine,  Then she had recurrence of pain the following Sunday(10/9)that prevented church attendance. Works in [employer] at [employer]. Pertinent History Reviewed:   Reviewed: Significant for morbid obesity. Medical         Past Medical History  Diagnosis Date  . Ovarian cyst     right                               Surgical Hx:    Past Surgical History  Procedure Laterality Date  . Tubal ligation    . Tubal ligation    . Ovarian cyst removal Left    Medications: Reviewed & Updated - see associated section                       Current outpatient prescriptions:  .  acetaminophen (TYLENOL) 500 MG tablet, Take 500 mg by mouth 2 (two) times daily as needed for moderate pain., Disp: , Rfl:  .  naproxen sodium (ANAPROX) 220 MG tablet, Take 440 mg by mouth daily as needed (pain)., Disp: , Rfl:  .  traMADol (ULTRAM) 50 MG tablet, Take 1 tablet (50 mg total) by mouth every 6 (six) hours as needed., Disp: 20 tablet, Rfl: 0   Social History: Reviewed -  reports that she has never smoked. She has never used smokeless tobacco.  Objective Findings:  Vitals: Blood pressure 130/78, height 5\' 4"  (1.626 m), weight 289 lb (131.09 kg).  Physical Examination: General appearance - alert, well appearing, and in no distress, oriented to person, place, and time and overweight Mental status - alert, oriented to person, place, and time, normal mood, behavior,  speech, dress, motor activity, and thought processes Abdomen - soft, nontender, nondistended, no masses or organomegaly Ct shows small cyst  Assessment & Plan:   A:  1. Resolved rlq pain 2 small rt adx cyst 3. Peri/post menopause.  P:  1. Plan : pelvic u.s 2. [place]

## 2015-08-30 NOTE — Progress Notes (Signed)
Patient ID: Erica Greer, female   DOB: [birth year], 50 y.o.   MRN: [id number] Pt here today for follow up from ED. Pt was told that she has an ovarian cyst on her right ovary. Pt states that the pain comes and goes. Pt states that she is having some pain today, rates pain a 3 on a scale from 0-10 today.

## 2015-08-31 LAB — FOLLICLE STIMULATING HORMONE: FSH: 27.5 m[IU]/mL

## 2015-09-01 ENCOUNTER — Ambulatory Visit (INDEPENDENT_AMBULATORY_CARE_PROVIDER_SITE_OTHER): Payer: 59

## 2015-09-01 ENCOUNTER — Other Ambulatory Visit: Payer: Self-pay | Admitting: Obstetrics and Gynecology

## 2015-09-01 DIAGNOSIS — N83201 Unspecified ovarian cyst, right side: Secondary | ICD-10-CM

## 2015-09-01 DIAGNOSIS — N951 Menopausal and female climacteric states: Secondary | ICD-10-CM | POA: Diagnosis not present

## 2015-09-01 MED ORDER — ESTRADIOL 1 MG PO TABS: 1.0000 mg | ORAL_TABLET | Freq: Every day | ORAL | Status: DC

## 2015-09-01 MED ORDER — TRAMADOL HCL 50 MG PO TABS: 50.0000 mg | ORAL_TABLET | Freq: Four times a day (QID) | ORAL | Status: DC | PRN

## 2015-09-01 NOTE — Progress Notes (Signed)
PELVIC US TA/TV: heterogenous anteverted uterus,lt oophorectomy,rt ov contains two simple ovarian cysts (#1) 3.5 x 3.2 x 3.2,(#2) 2.3 x 2.1 x 2 cm,no free fluid seen,appears to be mobile,rt adnexal pain during [employer] spoke w/ pt after ultrasound regarding medication for pain.

## 2015-09-02 ENCOUNTER — Other Ambulatory Visit: Payer: Self-pay | Admitting: Obstetrics and Gynecology

## 2015-09-02 ENCOUNTER — Telehealth: Payer: Self-pay | Admitting: Obstetrics and Gynecology

## 2015-09-02 DIAGNOSIS — N838 Other noninflammatory disorders of ovary, fallopian tube and broad ligament: Secondary | ICD-10-CM

## 2015-09-05 ENCOUNTER — Ambulatory Visit (INDEPENDENT_AMBULATORY_CARE_PROVIDER_SITE_OTHER): Payer: 59 | Admitting: Obstetrics and Gynecology

## 2015-09-05 ENCOUNTER — Encounter: Payer: Self-pay | Admitting: Obstetrics and Gynecology

## 2015-09-05 VITALS — BP 124/80 | Ht 64.0 in | Wt 292.0 lb

## 2015-09-05 DIAGNOSIS — D3911 Neoplasm of uncertain behavior of right ovary: Secondary | ICD-10-CM | POA: Diagnosis not present

## 2015-09-05 NOTE — Progress Notes (Signed)
Patient ID: Erica Greer, female   DOB: October 21, 1966, 49 y.o.   MRN: [id number]   [facility] Visit  Patient name: Erica Greer MRN [id number]  Date of birth: Jan 11, 1966  CC & HPI:  Erica Greer is a 49 y.o. female presenting today for follow up of rt adx pain, and u/s that shows a multi-loculated string of cysts, consistent with hydrosalpinx, or possibly an ovarian process will order Ca125.  ROS:    Pertinent History Reviewed:   Reviewed: Significant for rlq ache Medical         Past Medical History  Diagnosis Date  . Ovarian cyst     right                               Surgical Hx:    Past Surgical History  Procedure Laterality Date  . Tubal ligation    . Tubal ligation    . Ovarian cyst removal Left    Medications: Reviewed & Updated - see associated section                       Current outpatient prescriptions:  .  acetaminophen (TYLENOL) 500 MG tablet, Take 500 mg by mouth 2 (two) times daily as needed for moderate pain., Disp: , Rfl:  .  estradiol (ESTRACE) 1 MG tablet, Take 1 tablet (1 mg total) by mouth daily., Disp: 30 tablet, Rfl: 11 .  naproxen sodium (ANAPROX) 220 MG tablet, Take 440 mg by mouth daily as needed (pain)., Disp: , Rfl:  .  traMADol (ULTRAM) 50 MG tablet, Take 1 tablet (50 mg total) by mouth every 6 (six) hours as needed., Disp: 30 tablet, Rfl: 0   Social History: Reviewed -  reports that she has never smoked. She has never used smokeless tobacco.  Objective Findings:  Vitals: Blood pressure 124/80, height 5\' 4"  (1.626 m), weight 132.45 kg (292 lb).  Physical Examination: General appearance - alert, well appearing, and in no distress, oriented to person, place, and time and overweight Mental status - alert, oriented to person, place, and time, normal mood, behavior, speech, dress, motor activity, and thought processes Abdomen - soft, nontender, nondistended, no masses or organomegaly rlq ache  Assessment & Plan:   A:  1.  Check Ca 125  P:  1. Return for results, and consideration of surgical options.

## 2015-09-06 NOTE — Telephone Encounter (Signed)
appt made, see the notes from appt.

## 2015-09-08 ENCOUNTER — Ambulatory Visit: Payer: 59 | Admitting: Obstetrics and Gynecology

## 2015-09-11 ENCOUNTER — Encounter (HOSPITAL_COMMUNITY): Payer: Self-pay | Admitting: Emergency Medicine

## 2015-09-11 ENCOUNTER — Emergency Department (HOSPITAL_COMMUNITY)
Admission: EM | Admit: 2015-09-11 | Discharge: 2015-09-11 | Disposition: A | Discharge status: Home / Self Care | Payer: 59 | Attending: Emergency Medicine | Admitting: Emergency Medicine

## 2015-09-11 ENCOUNTER — Emergency Department (HOSPITAL_COMMUNITY): Payer: 59

## 2015-09-11 DIAGNOSIS — B9689 Other specified bacterial agents as the cause of diseases classified elsewhere: Secondary | ICD-10-CM

## 2015-09-11 DIAGNOSIS — R102 Pelvic and perineal pain: Secondary | ICD-10-CM

## 2015-09-11 DIAGNOSIS — Z8742 Personal history of other diseases of the female genital tract: Secondary | ICD-10-CM

## 2015-09-11 DIAGNOSIS — N760 Acute vaginitis: Secondary | ICD-10-CM | POA: Diagnosis not present

## 2015-09-11 LAB — URINALYSIS, ROUTINE W REFLEX MICROSCOPIC
BILIRUBIN URINE: NEGATIVE
GLUCOSE, UA: NEGATIVE mg/dL
KETONES UR: NEGATIVE mg/dL
Leukocytes, UA: NEGATIVE
NITRITE: NEGATIVE
PH: 5.5 (ref 5.0–8.0)
Protein, ur: NEGATIVE mg/dL
Specific Gravity, Urine: 1.01 (ref 1.005–1.030)
Urobilinogen, UA: 0.2 mg/dL (ref 0.0–1.0)

## 2015-09-11 LAB — CBC WITH DIFFERENTIAL/PLATELET
BASOS ABS: 0 10*3/uL (ref 0.0–0.1)
BASOS PCT: 0 %
EOS ABS: 0.2 10*3/uL (ref 0.0–0.7)
EOS PCT: 4 %
HCT: 37.9 % (ref 36.0–46.0)
Hemoglobin: 12.1 g/dL (ref 12.0–15.0)
LYMPHS ABS: 2.4 10*3/uL (ref 0.7–4.0)
Lymphocytes Relative: 34 %
MCH: 24.8 pg — ABNORMAL LOW (ref 26.0–34.0)
MCHC: 31.9 g/dL (ref 30.0–36.0)
MCV: 77.8 fL — AB (ref 78.0–100.0)
MONO ABS: 0.5 10*3/uL (ref 0.1–1.0)
MONOS PCT: 8 %
Neutro Abs: 3.8 10*3/uL (ref 1.7–7.7)
Neutrophils Relative %: 54 %
PLATELETS: 172 10*3/uL (ref 150–400)
RBC: 4.87 MIL/uL (ref 3.87–5.11)
RDW: 13.4 % (ref 11.5–15.5)
WBC: 6.9 10*3/uL (ref 4.0–10.5)

## 2015-09-11 LAB — BASIC METABOLIC PANEL
ANION GAP: 6 (ref 5–15)
BUN: 8 mg/dL (ref 6–20)
CALCIUM: 9 mg/dL (ref 8.9–10.3)
CO2: 28 mmol/L (ref 22–32)
Chloride: 108 mmol/L (ref 101–111)
Creatinine, Ser: 0.55 mg/dL (ref 0.44–1.00)
GFR calc Af Amer: >60 mL/min (ref >60)
GLUCOSE: 95 mg/dL (ref 65–99)
Potassium: 4 mmol/L (ref 3.5–5.1)
Sodium: 142 mmol/L (ref 135–145)

## 2015-09-11 LAB — HEPATIC FUNCTION PANEL
ALBUMIN: 4 g/dL (ref 3.5–5.0)
ALK PHOS: 50 U/L (ref 38–126)
ALT: 30 U/L (ref 14–54)
AST: 24 U/L (ref 15–41)
Bilirubin, Direct: <0.1 mg/dL — ABNORMAL LOW (ref 0.1–0.5)
TOTAL PROTEIN: 6.9 g/dL (ref 6.5–8.1)
Total Bilirubin: 0.5 mg/dL (ref 0.3–1.2)

## 2015-09-11 LAB — LIPASE, BLOOD: Lipase: 27 U/L (ref 11–51)

## 2015-09-11 LAB — URINE MICROSCOPIC-ADD ON

## 2015-09-11 LAB — WET PREP, GENITAL
TRICH WET PREP: NONE SEEN
Yeast Wet Prep HPF POC: NONE SEEN

## 2015-09-11 MED ORDER — TRAMADOL HCL 50 MG PO TABS: 50.0000 mg | ORAL_TABLET | Freq: Four times a day (QID) | ORAL | Status: DC | PRN

## 2015-09-11 MED ORDER — MORPHINE SULFATE (PF) 4 MG/ML IV SOLN
4.0000 mg | Freq: Once | INTRAVENOUS | Status: AC
  Administered 2015-09-11: 4 mg via INTRAMUSCULAR
  Filled 2015-09-11: qty 1

## 2015-09-11 NOTE — Discharge Instructions (Signed)

## 2015-09-11 NOTE — ED Provider Notes (Signed)
I discussed the ultrasound results and laboratory results with the patient. She is not reporting discharge, or malodor. Continues to show complex cyst without abnormal features, stranding fluid, or torsion. Her symptoms are right-sided. There were unable to visualize left ovary and she is a symptomatically in this area. I think she is appropriate for continued expectant management and follow-up with Dr. Glo Herring. She has a point with this Friday. Offered her simple pain control until that time.Tanna Furry, MD 09/11/15 [phone]

## 2015-09-11 NOTE — ED Notes (Signed)
Pt states she has been diagnosed with ovarian cysts on the right side. Pt states that she is having RLQ pain. Pt denies GI/GU, vaginal discharge, or fever. Pt states this is the same type of pain she has had with previous cysts.

## 2015-09-11 NOTE — ED Provider Notes (Signed)
CSN: [id number]     Arrival date & time 09/11/15  1225 History   First MD Initiated Contact with Patient 09/11/15 1242     Chief Complaint  Patient presents with  . Pelvic Pain  . Abdominal Pain     HPI  Pt was seen at 1305. Per pt, c/o gradual onset and persistence of constant right sided abd "pain" for the past 5 weeks, worse over the past 2 days. Pt describes the pain as "throbbing" with radiation into her right flank. Pt was evaluated in the ED on 08/17/15 for these symptoms and dx ovarian cyst. Pt f/u with her OB/GYN and had an [place] that showed "several ovarian cysts that they want to take out." Pt states she has been taking her pain meds (ultram) without relief. Pt states she is due to see her OB/GYN again this Friday (in 5 days) but "can't wait until then." Denies dysuria/hematuria, no CP/SOB, no fevers, no rash, no N/V/D, no vaginal bleeding/discharge.     OB/GYN: Dr. Glo Herring Past Medical History  Diagnosis Date  . Ovarian cyst     right    Past Surgical History  Procedure Laterality Date  . Tubal ligation    . Tubal ligation    . Ovarian cyst removal Left    Family History  Problem Relation Age of Onset  . Diabetes Mother   . Cancer Father   . Seizures Sister    Social History  Substance Use Topics  . Smoking status: Never Smoker   . Smokeless tobacco: Never Used  . Alcohol Use: No    Review of Systems ROS: Statement: All systems negative except as marked or noted in the HPI; Constitutional: Negative for fever and chills. ; ; Eyes: Negative for eye pain, redness and discharge. ; ; ENMT: Negative for ear pain, hoarseness, nasal congestion, sinus pressure and sore throat. ; ; Cardiovascular: Negative for chest pain, palpitations, diaphoresis, dyspnea and peripheral edema. ; ; Respiratory: Negative for cough, wheezing and stridor. ; ; Gastrointestinal: Negative for nausea, vomiting, diarrhea, blood in stool, hematemesis, jaundice and rectal bleeding. . ; ; Genitourinary:  Negative for dysuria, flank pain and hematuria. ; ; GYN:  +pelvic pain. No vaginal bleeding, no vaginal discharge, no vulvar pain. ;; Musculoskeletal: Negative for back pain and neck pain. Negative for swelling and trauma.; ; Skin: Negative for pruritus, rash, abrasions, blisters, bruising and skin lesion.; ; Neuro: Negative for headache, lightheadedness and neck stiffness. Negative for weakness, altered level of consciousness , altered mental status, extremity weakness, paresthesias, involuntary movement, seizure and syncope.      Allergies  Hydrocodone  Home Medications   Prior to Admission medications   Medication Sig Start Date End Date Taking? Authorizing Provider  acetaminophen (TYLENOL) 500 MG tablet Take 500 mg by mouth 2 (two) times daily as needed for moderate pain.   Yes Historical Provider, MD  naproxen sodium (ANAPROX) 220 MG tablet Take 440 mg by mouth daily as needed (pain).   Yes Historical Provider, MD  traMADol (ULTRAM) 50 MG tablet Take 1 tablet (50 mg total) by mouth every 6 (six) hours as needed. 09/01/15  Yes Jonnie Kind, MD   BP 139/81 mmHg  Pulse 72  Temp(Src) 97.7 F (36.5 C) (Oral)  Resp 18  Ht 5\' 4"  (1.626 m)  Wt 296 lb (134.265 kg)  BMI 50.78 kg/m2  SpO2 96% Physical Exam  1310: Physical examination:  Nursing notes reviewed; Vital signs and O2 SAT reviewed;  Constitutional: Well developed,  Well nourished, Well hydrated, Uncomfortable appearing; Head:  Normocephalic, atraumatic; Eyes: EOMI, PERRL, No scleral icterus; ENMT: Mouth and pharynx normal, Mucous membranes moist; Neck: Supple, Full range of motion, No lymphadenopathy; Cardiovascular: Regular rate and rhythm, No gallop; Respiratory: Breath sounds clear & equal bilaterally, No wheezes.  Speaking full sentences with ease, Normal respiratory effort/excursion; Chest: Nontender, Movement normal; Abdomen: Soft, +RLQ tenderness to palp. No rebound or guarding. Nondistended, Normal bowel sounds; Genitourinary:  No CVA tenderness. See pelvic exam below.; Spine:  No midline CS, TS, LS tenderness. +TTP right lumbar paraspinal muscles. No rash.;; Extremities: Pulses normal, No tenderness, No edema, No calf edema or asymmetry.; Neuro: AA&Ox3, Major CN grossly intact.  Speech clear. No gross focal motor or sensory deficits in extremities. Climbs on and off stretcher easily by herself. Gait steady.; Skin: Color normal, Warm, Dry.   ED Course  Procedures (including critical care time) Labs Review   Imaging Review  I have personally reviewed and evaluated these images and lab results as part of my medical decision-making.   EKG Interpretation None      MDM  MDM Reviewed: previous chart, nursing note and vitals Reviewed previous: ultrasound, labs and CT scan Interpretation: labs and ultrasound    Ct Renal Stone Study 08/17/2015  CLINICAL DATA:  Right flank pain. EXAM: CT ABDOMEN AND PELVIS WITHOUT CONTRAST TECHNIQUE: Multidetector CT imaging of the abdomen and pelvis was performed following the standard protocol without IV contrast. COMPARISON:  None. FINDINGS: Lower chest:  Clear lung bases.  Normal heart size. Hepatobiliary: Diffuse low attenuation of the liver as can be seen with hepatic steatosis. No focal hepatic mass. Normal gallbladder. Pancreas: Code normal. Spleen: Normal. Adrenals/Urinary Tract: Normal adrenal glands. Normal kidneys. No urolithiasis or obstructive uropathy. Normal bladder. Stomach/Bowel: No bowel wall thickening or dilatation. No pneumatosis, pneumoperitoneum or portal venous gas. No abdominal or pelvic free fluid. Normal appendix. Vascular/Lymphatic: Normal caliber abdominal aorta. No abdominal or pelvic lymphadenopathy. Reproductive: Normal uterus. 3.5 cm intermediate density right ovarian mass which may represent a hemorrhagic cyst or endometrioma. There is small amount of loculated fluid along the medial aspect of the right ovarian mass which may reflect a para ovarian cyst or  hydrosalpinx. Other: No focal fluid collection or hematoma. Musculoskeletal: No acute osseous abnormality. Degenerative changes of bilateral sacroiliac joints. No aggressive lytic or sclerotic osseous lesion. Bilateral facet arthropathy at L3-4. IMPRESSION: 1. No urolithiasis or obstructive uropathy. 2. 3.5 cm intermediate density right ovarian mass which may represent a hemorrhagic cyst or endometrioma. Electronically Signed   By: Kathreen Devoid   On: 08/17/2015 13:15    Results for orders placed or performed during the hospital encounter of 09/11/15  Wet prep, genital  Result Value Ref Range   Yeast Wet Prep HPF POC NONE SEEN NONE SEEN   Trich, Wet Prep NONE SEEN NONE SEEN   Clue Cells Wet Prep HPF POC FEW (A) NONE SEEN   WBC, Wet Prep HPF POC RARE (A) NONE SEEN  CBC with Differential  Result Value Ref Range   WBC 6.9 4.0 - 10.5 K/uL   RBC 4.87 3.87 - 5.11 MIL/uL   Hemoglobin 12.1 12.0 - 15.0 g/dL   HCT 37.9 36.0 - 46.0 %   MCV 77.8 (L) 78.0 - 100.0 fL   MCH 24.8 (L) 26.0 - 34.0 pg   MCHC 31.9 30.0 - 36.0 g/dL   RDW 13.4 11.5 - 15.5 %   Platelets 172 150 - 400 K/uL   Neutrophils Relative % 54 %  Neutro Abs 3.8 1.7 - 7.7 K/uL   Lymphocytes Relative 34 %   Lymphs Abs 2.4 0.7 - 4.0 K/uL   Monocytes Relative 8 %   Monocytes Absolute 0.5 0.1 - 1.0 K/uL   Eosinophils Relative 4 %   Eosinophils Absolute 0.2 0.0 - 0.7 K/uL   Basophils Relative 0 %   Basophils Absolute 0.0 0.0 - 0.1 K/uL  Basic metabolic panel  Result Value Ref Range   Sodium 142 135 - 145 mmol/L   Potassium 4.0 3.5 - 5.1 mmol/L   Chloride 108 101 - 111 mmol/L   CO2 28 22 - 32 mmol/L   Glucose, Bld 95 65 - 99 mg/dL   BUN 8 6 - 20 mg/dL   Creatinine, Ser 0.55 0.44 - 1.00 mg/dL   Calcium 9.0 8.9 - 10.3 mg/dL   GFR calc non Af Amer >60 >60 mL/min   GFR calc Af Amer >60 >60 mL/min   Anion gap 6 5 - 15  Lipase, blood  Result Value Ref Range   Lipase 27 11 - 51 U/L  Hepatic function panel  Result Value Ref Range    Total Protein 6.9 6.5 - 8.1 g/dL   Albumin 4.0 3.5 - 5.0 g/dL   AST 24 15 - 41 U/L   ALT 30 14 - 54 U/L   Alkaline Phosphatase 50 38 - 126 U/L   Total Bilirubin 0.5 0.3 - 1.2 mg/dL   Bilirubin, Direct <0.1 (L) 0.1 - 0.5 mg/dL   Indirect Bilirubin NOT CALCULATED 0.3 - 0.9 mg/dL     1555:   Pt feels more comfortable now after pain meds and is agreeable to have pelvic exam performed. Pelvic exam performed with permission of pt and female ED tech assist during exam.  External genitalia w/o lesions. Vaginal vault with thick white discharge.  Cervix w/o lesions, not friable, GC/chlam and wet prep obtained and sent to lab.  Bimanual exam w/o CMT, uterine or left pelvic tenderness, +mild right pelvic tenderness.  US pelvis results are pending. Pt needs to provide urine sample for UA. Will need tx for BV.  Sign out to Dr. Jeneen Rinks.       Francine Graven, DO 09/11/15 1558

## 2015-09-12 LAB — GC/CHLAMYDIA PROBE AMP (~~LOC~~) NOT AT ARMC
Chlamydia: NEGATIVE
Neisseria Gonorrhea: NEGATIVE

## 2015-09-16 ENCOUNTER — Other Ambulatory Visit: Payer: Self-pay | Admitting: Obstetrics and Gynecology

## 2015-09-16 ENCOUNTER — Ambulatory Visit (INDEPENDENT_AMBULATORY_CARE_PROVIDER_SITE_OTHER): Payer: 59 | Admitting: Obstetrics and Gynecology

## 2015-09-16 ENCOUNTER — Encounter: Payer: Self-pay | Admitting: Obstetrics and Gynecology

## 2015-09-16 VITALS — BP 128/76 | Ht 64.0 in | Wt 284.0 lb

## 2015-09-16 DIAGNOSIS — N839 Noninflammatory disorder of ovary, fallopian tube and broad ligament, unspecified: Secondary | ICD-10-CM

## 2015-09-16 DIAGNOSIS — N838 Other noninflammatory disorders of ovary, fallopian tube and broad ligament: Secondary | ICD-10-CM | POA: Insufficient documentation

## 2015-09-16 MED ORDER — TRAMADOL HCL 50 MG PO TABS: 50.0000 mg | ORAL_TABLET | Freq: Four times a day (QID) | ORAL | Status: DC | PRN

## 2015-09-16 MED ORDER — OXYCODONE-ACETAMINOPHEN 5-325 MG PO TABS: 1.0000 | ORAL_TABLET | ORAL | Status: DC | PRN

## 2015-09-16 NOTE — H&P (Addendum)
Farley Ly, LPN at 87/03/6432 29:51 AM     Status: Signed       Expand All Collapse All   Patient ID: Erica Greer, female DOB: December 13, 1965, 49 y.o. MRN: [id number] Pt here today for follow up. Pt states that her pain got bad and she had to go to the ED on Sunday.             Jonnie Kind, MD at 09/16/2015 10:57 AM     Status: Signed       Expand All Collapse All   Patient ID: Erica Greer, female DOB: [birth year], 49 y.o. MRN: [id number]  Preoperative History and Physical  Erica Greer is a 49 y.o. No obstetric history on file. here for surgical management of rt adx pain. Pt states she has been managing her pain with tramadol and percocet with some relief. Pt reports she went to the ED 5 days ago for worsening pain. An ultrasound was completed at the ED that showed the following:  Right Ovary: 5.2 x 3.7 x 6.0 cm. Several cystic lesions are again noted within the right ovary, similar to the recent prior studies. The largest measures 3.5 x 3.1 x 3.2 cm. No solid mass demonstrated. There is blood flow peripherally within the right ovary on color Doppler.  No significant preoperative concerns.  Proposed surgery: Laparoscopic right salpingo oophorectomy and left salpingectomy  Past Medical History  Diagnosis Date  . Ovarian cyst     right    Past Surgical History  Procedure Laterality Date  . Tubal ligation    . Tubal ligation    . Ovarian cyst removal Left    OB History  No data available  Patient denies any other pertinent gynecologic issues.   Current Outpatient Prescriptions on File Prior to Visit  Medication Sig Dispense Refill  . acetaminophen (TYLENOL) 500 MG tablet Take 500 mg by mouth 2 (two) times daily as needed for moderate pain.    . naproxen sodium (ANAPROX) 220 MG tablet Take 440 mg by mouth daily as needed (pain).    . traMADol (ULTRAM) 50 MG tablet Take 1 tablet (50 mg total) by  mouth every 6 (six) hours as needed. 15 tablet 0   No current facility-administered medications on file prior to visit.   Allergies  Allergen Reactions  . Hydrocodone Nausea And Vomiting    Social History:  reports that she has never smoked. She has never used smokeless tobacco. She reports that she does not drink alcohol or use illicit drugs.  Family History  Problem Relation Age of Onset  . Diabetes Mother   . Cancer Father   . Seizures Sister     Review of Systems: Noncontributory  PHYSICAL EXAM: Blood pressure 128/76, height 5\' 4"  (1.626 m), weight 284 lb (128.822 kg). General appearance - alert, well appearing, and in no distress Chest - clear to auscultation, no wheezes, rales or rhonchi, symmetric air entry Heart - normal rate and regular rhythm Abdomen - soft, nontender, nondistended, no masses or organomegaly Pelvic - examination not indicated. Good support normal appearing cervix. Extremities - peripheral pulses normal, no pedal edema, no clubbing or cyanosis  Labs: Results for orders placed or performed during the hospital encounter of 09/11/15 (from the past 336 hour(s))  GC/Chlamydia probe amp ([place])not at [facility]   Collection Time: 09/11/15 12:00 AM  Result Value Ref Range   Chlamydia Negative    Neisseria gonorrhea Negative   CBC with Differential  Collection Time: 09/11/15 12:52 PM  Result Value Ref Range   WBC 6.9 4.0 - 10.5 K/uL   RBC 4.87 3.87 - 5.11 MIL/uL   Hemoglobin 12.1 12.0 - 15.0 g/dL   HCT 37.9 36.0 - 46.0 %   MCV 77.8 (L) 78.0 - 100.0 fL   MCH 24.8 (L) 26.0 - 34.0 pg   MCHC 31.9 30.0 - 36.0 g/dL   RDW 13.4 11.5 - 15.5 %   Platelets 172 150 - 400 K/uL   Neutrophils Relative % 54 %   Neutro Abs 3.8 1.7 - 7.7 K/uL   Lymphocytes Relative 34 %   Lymphs Abs 2.4 0.7 - 4.0 K/uL   Monocytes Relative 8 %   Monocytes Absolute 0.5 0.1  - 1.0 K/uL   Eosinophils Relative 4 %   Eosinophils Absolute 0.2 0.0 - 0.7 K/uL   Basophils Relative 0 %   Basophils Absolute 0.0 0.0 - 0.1 K/uL  Basic metabolic panel   Collection Time: 09/11/15 12:52 PM  Result Value Ref Range   Sodium 142 135 - 145 mmol/L   Potassium 4.0 3.5 - 5.1 mmol/L   Chloride 108 101 - 111 mmol/L   CO2 28 22 - 32 mmol/L   Glucose, Bld 95 65 - 99 mg/dL   BUN 8 6 - 20 mg/dL   Creatinine, Ser 0.55 0.44 - 1.00 mg/dL   Calcium 9.0 8.9 - 10.3 mg/dL   GFR calc non Af Amer >60 >60 mL/min   GFR calc Af Amer >60 >60 mL/min   Anion gap 6 5 - 15  Lipase, blood   Collection Time: 09/11/15 12:52 PM  Result Value Ref Range   Lipase 27 11 - 51 U/L  Hepatic function panel   Collection Time: 09/11/15 12:52 PM  Result Value Ref Range   Total Protein 6.9 6.5 - 8.1 g/dL   Albumin 4.0 3.5 - 5.0 g/dL   AST 24 15 - 41 U/L   ALT 30 14 - 54 U/L   Alkaline Phosphatase 50 38 - 126 U/L   Total Bilirubin 0.5 0.3 - 1.2 mg/dL   Bilirubin, Direct <0.1 (L) 0.1 - 0.5 mg/dL   Indirect Bilirubin NOT CALCULATED 0.3 - 0.9 mg/dL  Wet prep, genital   Collection Time: 09/11/15 3:47 PM  Result Value Ref Range   Yeast Wet Prep HPF POC NONE SEEN NONE SEEN   Trich, Wet Prep NONE SEEN NONE SEEN   Clue Cells Wet Prep HPF POC FEW (A) NONE SEEN   WBC, Wet Prep HPF POC RARE (A) NONE SEEN  Urinalysis, Routine w reflex microscopic   Collection Time: 09/11/15 4:20 PM  Result Value Ref Range   Color, Urine YELLOW YELLOW   APPearance CLEAR CLEAR   Specific Gravity, Urine 1.010 1.005 - 1.030   pH 5.5 5.0 - 8.0   Glucose, UA NEGATIVE NEGATIVE mg/dL   Hgb urine dipstick MODERATE (A) NEGATIVE   Bilirubin Urine NEGATIVE NEGATIVE   Ketones, ur NEGATIVE NEGATIVE mg/dL   Protein, ur NEGATIVE NEGATIVE mg/dL   Urobilinogen,  UA 0.2 0.0 - 1.0 mg/dL   Nitrite NEGATIVE NEGATIVE   Leukocytes, UA NEGATIVE NEGATIVE  Urine microscopic-add on   Collection Time: 09/11/15 4:20 PM  Result Value Ref Range   Squamous Epithelial / LPF RARE RARE   RBC / HPF 3-6 <3 RBC/hpf    Imaging Studies:  Imaging Results    US Transvaginal Non-ob  09/11/2015 CLINICAL DATA: Right pelvic pain for 2 days. History of ovarian cysts  and possible left oophorectomy. Perimenopausal patient with no menses for 6 months. Evaluate for torsion. EXAM: TRANSABDOMINAL AND TRANSVAGINAL ULTRASOUND OF PELVIS DOPPLER ULTRASOUND OF OVARIES TECHNIQUE: Both transabdominal and transvaginal ultrasound examinations of the pelvis were performed. Transabdominal technique was performed for global imaging of the pelvis including uterus, ovaries, adnexal regions, and pelvic cul-de-sac. It was necessary to proceed with endovaginal exam following the transabdominal exam to visualize the endometrium and ovaries to better advantage. Color and duplex Doppler ultrasound was utilized to evaluate blood flow to the ovaries. COMPARISON: Abdominal pelvic CT 08/17/2015. Images only from outside pelvic ultrasound 09/01/2015-no report. FINDINGS: Study is mildly limited by body habitus. Uterus Measurements: 9.1 x 3.6 x 4.2 cm. Mildly heterogeneous without focal fibroid. Cervical nabothian cysts noted. Endometrium Thickness: 6 mm. No focal abnormality visualized. Right ovary Measurements: 5.2 x 3.7 x 6.0 cm. Several cystic lesions are again noted within the right ovary, similar to the recent prior studies. The largest measures 3.5 x 3.1 x 3.2 cm. No solid mass demonstrated. There is blood flow peripherally within the right ovary on color Doppler. Left ovary Measurements: Not visualized. Pulsed Doppler evaluation of the right ovary demonstrates normal low-resistance arterial and venous waveforms. Left ovary not visualized. Other findings No free fluid. IMPRESSION: 1.  Enlarged right ovary with several cysts, similar to recent outside study. 2. No evidence of ovarian torsion. 3. Left ovary not visualized. Electronically Signed By: Richardean Sale M.D. On: 09/11/2015 15:58   US Transvaginal Non-ob  09/02/2015 GYNECOLOGIC SONOGRAM Erica Greer is a 49 y.o. perimenopausal,no periods for 6 mons.,she is here for a pelvic sonogram for RLQ pain. Uterus 8.8 x 3.6 x 3.6 cm, anteverted heterogenous uterus Endometrium  4 mm, symmetrical, wnl Right ovary 5.9 x 4.4 x 3.7 cm, rt ov contains two simple ovarian cysts (#1) 3.5 x 3.2 x 3.2,(#2) 2.3 x 2.1 x 2 cm Left ovary Surgically absent Technician Comments: PELVIC US TA/TV: heterogenous anteverted uterus,lt oophorectomy,no free fluid seen,appears to be mobile,rt adnexal pain during [employer] spoke w/ pt after ultrasound regarding medication for pain. Amber Heide Guile 09/01/2015 2:50 PM Clinical Impression and recommendations: I have reviewed the sonogram results above. [place] has returned at 47, postmenopausal. Combined with the patient's current clinical course, below are my impressions and any appropriate recommendations for management based on the sonographic findings: 1 . Anteverted uterus, with thin endometrium, and normal left ovary 2. Several loculations in the area of the right ovary, and tube, with no internal blood flow on doppler studies suggesting benign nature. 3. Will add Ca-125 to evaluation, and short interval follow up recommended , consideration of excision recommended. Svea Pusch V  US Pelvis Complete  09/11/2015 CLINICAL DATA: Right pelvic pain for 2 days. History of ovarian cysts and possible left oophorectomy. Perimenopausal patient with no menses for 6 months. Evaluate for torsion. EXAM: TRANSABDOMINAL AND TRANSVAGINAL ULTRASOUND OF PELVIS DOPPLER ULTRASOUND OF OVARIES TECHNIQUE: Both transabdominal and transvaginal ultrasound examinations of  the pelvis were performed. Transabdominal technique was performed for global imaging of the pelvis including uterus, ovaries, adnexal regions, and pelvic cul-de-sac. It was necessary to proceed with endovaginal exam following the transabdominal exam to visualize the endometrium and ovaries to better advantage. Color and duplex Doppler ultrasound was utilized to evaluate blood flow to the ovaries. COMPARISON: Abdominal pelvic CT 08/17/2015. Images only from outside pelvic ultrasound 09/01/2015-no report. FINDINGS: Study is mildly limited by body habitus. Uterus Measurements: 9.1 x 3.6 x 4.2 cm. Mildly heterogeneous without focal fibroid. Cervical nabothian cysts  noted. Endometrium Thickness: 6 mm. No focal abnormality visualized. Right ovary Measurements: 5.2 x 3.7 x 6.0 cm. Several cystic lesions are again noted within the right ovary, similar to the recent prior studies. The largest measures 3.5 x 3.1 x 3.2 cm. No solid mass demonstrated. There is blood flow peripherally within the right ovary on color Doppler. Left ovary Measurements: Not visualized. Pulsed Doppler evaluation of the right ovary demonstrates normal low-resistance arterial and venous waveforms. Left ovary not visualized. Other findings No free fluid. IMPRESSION: 1. Enlarged right ovary with several cysts, similar to recent outside study. 2. No evidence of ovarian torsion. 3. Left ovary not visualized. Electronically Signed By: Richardean Sale M.D. On: 09/11/2015 15:58   US Pelvis Complete  09/02/2015 GYNECOLOGIC SONOGRAM Erica Greer is a 49 y.o. perimenopausal,no periods for 6 mons.,she is here for a pelvic sonogram for RLQ pain. Uterus 8.8 x 3.6 x 3.6 cm, anteverted heterogenous uterus Endometrium  4 mm, symmetrical, wnl Right ovary 5.9 x 4.4 x 3.7 cm, rt ov contains two simple ovarian cysts (#1) 3.5 x 3.2 x 3.2,(#2) 2.3 x 2.1 x 2 cm Left ovary Surgically absent Technician  Comments: PELVIC US TA/TV: heterogenous anteverted uterus,lt oophorectomy,no free fluid seen,appears to be mobile,rt adnexal pain during [employer] spoke w/ pt after ultrasound regarding medication for pain. Amber Heide Guile 09/01/2015 2:50 PM Clinical Impression and recommendations: I have reviewed the sonogram results above. [place] has returned at 89, postmenopausal. Combined with the patient's current clinical course, below are my impressions and any appropriate recommendations for management based on the sonographic findings: 1 . Anteverted uterus, with thin endometrium, and normal left ovary 2. Several loculations in the area of the right ovary, and tube, with no internal blood flow on doppler studies suggesting benign nature. 3. Will add Ca-125 to evaluation, and short interval follow up recommended , consideration of excision recommended. Jenniffer Vessels V  [place] Art/ven Flow Abd Pelv Doppler  09/11/2015 CLINICAL DATA: Right pelvic pain for 2 days. History of ovarian cysts and possible left oophorectomy. Perimenopausal patient with no menses for 6 months. Evaluate for torsion. EXAM: TRANSABDOMINAL AND TRANSVAGINAL ULTRASOUND OF PELVIS DOPPLER ULTRASOUND OF OVARIES TECHNIQUE: Both transabdominal and transvaginal ultrasound examinations of the pelvis were performed. Transabdominal technique was performed for global imaging of the pelvis including uterus, ovaries, adnexal regions, and pelvic cul-de-sac. It was necessary to proceed with endovaginal exam following the transabdominal exam to visualize the endometrium and ovaries to better advantage. Color and duplex Doppler ultrasound was utilized to evaluate blood flow to the ovaries. COMPARISON: Abdominal pelvic CT 08/17/2015. Images only from outside pelvic ultrasound 09/01/2015-no report. FINDINGS: Study is mildly limited by body habitus. Uterus Measurements: 9.1 x 3.6 x 4.2 cm. Mildly heterogeneous without focal fibroid. Cervical nabothian cysts noted.  Endometrium Thickness: 6 mm. No focal abnormality visualized. Right ovary Measurements: 5.2 x 3.7 x 6.0 cm. Several cystic lesions are again noted within the right ovary, similar to the recent prior studies. The largest measures 3.5 x 3.1 x 3.2 cm. No solid mass demonstrated. There is blood flow peripherally within the right ovary on color Doppler. Left ovary Measurements: Not visualized. Pulsed Doppler evaluation of the right ovary demonstrates normal low-resistance arterial and venous waveforms. Left ovary not visualized. Other findings No free fluid. IMPRESSION: 1. Enlarged right ovary with several cysts, similar to recent outside study. 2. No evidence of ovarian torsion. 3. Left ovary not visualized. Electronically Signed By: Richardean Sale M.D. On: 09/11/2015 15:58   Ct Renal Stone  Study  08/17/2015 CLINICAL DATA: Right flank pain. EXAM: CT ABDOMEN AND PELVIS WITHOUT CONTRAST TECHNIQUE: Multidetector CT imaging of the abdomen and pelvis was performed following the standard protocol without IV contrast. COMPARISON: None. FINDINGS: Lower chest: Clear lung bases. Normal heart size. Hepatobiliary: Diffuse low attenuation of the liver as can be seen with hepatic steatosis. No focal hepatic mass. Normal gallbladder. Pancreas: Code normal. Spleen: Normal. Adrenals/Urinary Tract: Normal adrenal glands. Normal kidneys. No urolithiasis or obstructive uropathy. Normal bladder. Stomach/Bowel: No bowel wall thickening or dilatation. No pneumatosis, pneumoperitoneum or portal venous gas. No abdominal or pelvic free fluid. Normal appendix. Vascular/Lymphatic: Normal caliber abdominal aorta. No abdominal or pelvic lymphadenopathy. Reproductive: Normal uterus. 3.5 cm intermediate density right ovarian mass which may represent a hemorrhagic cyst or endometrioma. There is small amount of loculated fluid along the medial aspect of the right ovarian mass which may reflect a para ovarian cyst or hydrosalpinx. Other: No  focal fluid collection or hematoma. Musculoskeletal: No acute osseous abnormality. Degenerative changes of bilateral sacroiliac joints. No aggressive lytic or sclerotic osseous lesion. Bilateral facet arthropathy at L3-4. IMPRESSION: 1. No urolithiasis or obstructive uropathy. 2. 3.5 cm intermediate density right ovarian mass which may represent a hemorrhagic cyst or endometrioma. Electronically Signed By: Kathreen Devoid On: 08/17/2015 13:15     Assessment: There are no active problems to display for this patient.   Plan: Patient will undergo surgical management with Laparoscopic right salpingo oophorectomy and left salpingectomy.

## 2015-09-16 NOTE — Progress Notes (Signed)
Patient ID: Erica Greer, female   DOB: 03-20-1966, 49 y.o.   MRN: [id number] Pt here today for follow up. Pt states that her pain got bad and she had to go to the ED on Sunday.

## 2015-09-16 NOTE — Progress Notes (Signed)
Patient ID: Erica Greer, female   DOB: 15-Mar-1966, 49 y.o.   MRN: [id number]  Preoperative History and Physical  Erica Greer is a 49 y.o. No obstetric history on file. here for surgical management of  rt adx pain.  Pt states she has been managing her pain with tramadol and percocet with some relief. Pt reports she went to the ED 5 days ago for worsening pain. An ultrasound was completed at the ED that showed the following:  Right Ovary: 5.2 x 3.7 x 6.0 cm. Several cystic lesions are again noted within the right ovary, similar to the recent prior studies. The largest measures 3.5 x 3.1 x 3.2 cm. No solid mass demonstrated. There is blood flow peripherally within the right ovary on color Doppler.  No significant preoperative concerns.  Proposed surgery: Laparoscopic right salpingo oophorectomy and left salpingo oophorectomy  Past Medical History  Diagnosis Date  . Ovarian cyst     right    Past Surgical History  Procedure Laterality Date  . Tubal ligation    . Tubal ligation    . Ovarian cyst removal Left    OB History  No data available  Patient denies any other pertinent gynecologic issues.   Current Outpatient Prescriptions on File Prior to Visit  Medication Sig Dispense Refill  . acetaminophen (TYLENOL) 500 MG tablet Take 500 mg by mouth 2 (two) times daily as needed for moderate pain.    . naproxen sodium (ANAPROX) 220 MG tablet Take 440 mg by mouth daily as needed (pain).    . traMADol (ULTRAM) 50 MG tablet Take 1 tablet (50 mg total) by mouth every 6 (six) hours as needed. 15 tablet 0   No current facility-administered medications on file prior to visit.   Allergies  Allergen Reactions  . Hydrocodone Nausea And Vomiting    Social History:   reports that she has never smoked. She has never used smokeless tobacco. She reports that she does not drink alcohol or use illicit drugs.  Family History  Problem Relation Age of Onset  . Diabetes Mother   . Cancer Father    . Seizures Sister     Review of Systems: Noncontributory  PHYSICAL EXAM: Blood pressure 128/76, height 5\' 4"  (1.626 m), weight 284 lb (128.822 kg). General appearance - alert, well appearing, and in no distress Chest - clear to auscultation, no wheezes, rales or rhonchi, symmetric air entry Heart - normal rate and regular rhythm Abdomen - soft, nontender, nondistended, no masses or organomegaly Pelvic - examination not indicated. Good support normal appearing cervix. Extremities - peripheral pulses normal, no pedal edema, no clubbing or cyanosis  Labs: Results for orders placed or performed during the hospital encounter of 09/11/15 (from the past 336 hour(s))  GC/Chlamydia probe amp ([place])not at [facility]   Collection Time: 09/11/15 12:00 AM  Result Value Ref Range   Chlamydia Negative    Neisseria gonorrhea Negative   CBC with Differential   Collection Time: 09/11/15 12:52 PM  Result Value Ref Range   WBC 6.9 4.0 - 10.5 K/uL   RBC 4.87 3.87 - 5.11 MIL/uL   Hemoglobin 12.1 12.0 - 15.0 g/dL   HCT 37.9 36.0 - 46.0 %   MCV 77.8 (L) 78.0 - 100.0 fL   MCH 24.8 (L) 26.0 - 34.0 pg   MCHC 31.9 30.0 - 36.0 g/dL   RDW 13.4 11.5 - 15.5 %   Platelets 172 150 - 400 K/uL   Neutrophils Relative % 54 %  Neutro Abs 3.8 1.7 - 7.7 K/uL   Lymphocytes Relative 34 %   Lymphs Abs 2.4 0.7 - 4.0 K/uL   Monocytes Relative 8 %   Monocytes Absolute 0.5 0.1 - 1.0 K/uL   Eosinophils Relative 4 %   Eosinophils Absolute 0.2 0.0 - 0.7 K/uL   Basophils Relative 0 %   Basophils Absolute 0.0 0.0 - 0.1 K/uL  Basic metabolic panel   Collection Time: Sep 14, 2015 12:52 PM  Result Value Ref Range   Sodium 142 135 - 145 mmol/L   Potassium 4.0 3.5 - 5.1 mmol/L   Chloride 108 101 - 111 mmol/L   CO2 28 22 - 32 mmol/L   Glucose, Bld 95 65 - 99 mg/dL   BUN 8 6 - 20 mg/dL   Creatinine, Ser 0.55 0.44 - 1.00 mg/dL   Calcium 9.0 8.9 - 10.3 mg/dL   GFR calc non Af Amer >60 >60 mL/min   GFR calc Af Amer >60 >60  mL/min   Anion gap 6 5 - 15  Lipase, blood   Collection Time: 2015/09/14 12:52 PM  Result Value Ref Range   Lipase 27 11 - 51 U/L  Hepatic function panel   Collection Time: 14-Sep-2015 12:52 PM  Result Value Ref Range   Total Protein 6.9 6.5 - 8.1 g/dL   Albumin 4.0 3.5 - 5.0 g/dL   AST 24 15 - 41 U/L   ALT 30 14 - 54 U/L   Alkaline Phosphatase 50 38 - 126 U/L   Total Bilirubin 0.5 0.3 - 1.2 mg/dL   Bilirubin, Direct <0.1 (L) 0.1 - 0.5 mg/dL   Indirect Bilirubin NOT CALCULATED 0.3 - 0.9 mg/dL  Wet prep, genital   Collection Time: 2015-09-14  3:47 PM  Result Value Ref Range   Yeast Wet Prep HPF POC NONE SEEN NONE SEEN   Trich, Wet Prep NONE SEEN NONE SEEN   Clue Cells Wet Prep HPF POC FEW (A) NONE SEEN   WBC, Wet Prep HPF POC RARE (A) NONE SEEN  Urinalysis, Routine w reflex microscopic   Collection Time: 09/14/15  4:20 PM  Result Value Ref Range   Color, Urine YELLOW YELLOW   APPearance CLEAR CLEAR   Specific Gravity, Urine 1.010 1.005 - 1.030   pH 5.5 5.0 - 8.0   Glucose, UA NEGATIVE NEGATIVE mg/dL   Hgb urine dipstick MODERATE (A) NEGATIVE   Bilirubin Urine NEGATIVE NEGATIVE   Ketones, ur NEGATIVE NEGATIVE mg/dL   Protein, ur NEGATIVE NEGATIVE mg/dL   Urobilinogen, UA 0.2 0.0 - 1.0 mg/dL   Nitrite NEGATIVE NEGATIVE   Leukocytes, UA NEGATIVE NEGATIVE  Urine microscopic-add on   Collection Time: 2015-09-14  4:20 PM  Result Value Ref Range   Squamous Epithelial / LPF RARE RARE   RBC / HPF 3-6 <3 RBC/hpf    Imaging Studies: US Transvaginal Non-ob  09-14-15  CLINICAL DATA:  Right pelvic pain for 2 days. History of ovarian cysts and possible left oophorectomy. Perimenopausal patient with no menses for 6 months. Evaluate for torsion. EXAM: TRANSABDOMINAL AND TRANSVAGINAL ULTRASOUND OF PELVIS DOPPLER ULTRASOUND OF OVARIES TECHNIQUE: Both transabdominal and transvaginal ultrasound examinations of the pelvis were performed. Transabdominal technique was performed for global imaging of  the pelvis including uterus, ovaries, adnexal regions, and pelvic cul-de-sac. It was necessary to proceed with endovaginal exam following the transabdominal exam to visualize the endometrium and ovaries to better advantage. Color and duplex Doppler ultrasound was utilized to evaluate blood flow to the ovaries. COMPARISON:  Abdominal  pelvic CT 08/17/2015. Images only from outside pelvic ultrasound 09/01/2015-no report. FINDINGS: Study is mildly limited by body habitus. Uterus Measurements: 9.1 x 3.6 x 4.2 cm. Mildly heterogeneous without focal fibroid. Cervical nabothian cysts noted. Endometrium Thickness: 6 mm.  No focal abnormality visualized. Right ovary Measurements: 5.2 x 3.7 x 6.0 cm. Several cystic lesions are again noted within the right ovary, similar to the recent prior studies. The largest measures 3.5 x 3.1 x 3.2 cm. No solid mass demonstrated. There is blood flow peripherally within the right ovary on color Doppler. Left ovary Measurements: Not visualized. Pulsed Doppler evaluation of the right ovary demonstrates normal low-resistance arterial and venous waveforms. Left ovary not visualized. Other findings No free fluid. IMPRESSION: 1. Enlarged right ovary with several cysts, similar to recent outside study. 2. No evidence of ovarian torsion. 3. Left ovary not visualized. Electronically Signed   By: Richardean Sale M.D.   On: 09/11/2015 15:58   US Transvaginal Non-ob  09/02/2015  GYNECOLOGIC SONOGRAM TERRILYNN POSTELL is a 49 y.o. perimenopausal,no periods for 6 mons.,she is here for a pelvic sonogram for RLQ pain. Uterus                      8.8 x 3.6 x 3.6 cm, anteverted heterogenous uterus Endometrium          4 mm, symmetrical, wnl Right ovary             5.9 x 4.4 x 3.7 cm, rt ov contains two simple ovarian cysts (#1) 3.5 x 3.2 x 3.2,(#2) 2.3 x 2.1 x 2 cm Left ovary               Surgically absent Technician Comments: PELVIC US TA/TV: heterogenous anteverted uterus,lt oophorectomy,no free fluid  seen,appears to be mobile,rt adnexal pain during [employer] spoke w/ pt after ultrasound regarding medication for pain. Amber Heide Guile 09/01/2015 2:50 PM Clinical Impression and recommendations: I have reviewed the sonogram results above. [place] has returned at 5, postmenopausal. Combined with the patient's current clinical course, below are my impressions and any appropriate recommendations for management based on the sonographic findings: 1 . Anteverted uterus, with thin endometrium, and normal left ovary 2. Several loculations in the area of the right ovary, and tube, with no internal blood flow on doppler studies suggesting benign nature. 3. Will add Ca-125 to evaluation, and short interval follow up recommended , consideration of excision recommended. Aristidis Talerico V  US Pelvis Complete  09/11/2015  CLINICAL DATA:  Right pelvic pain for 2 days. History of ovarian cysts and possible left oophorectomy. Perimenopausal patient with no menses for 6 months. Evaluate for torsion. EXAM: TRANSABDOMINAL AND TRANSVAGINAL ULTRASOUND OF PELVIS DOPPLER ULTRASOUND OF OVARIES TECHNIQUE: Both transabdominal and transvaginal ultrasound examinations of the pelvis were performed. Transabdominal technique was performed for global imaging of the pelvis including uterus, ovaries, adnexal regions, and pelvic cul-de-sac. It was necessary to proceed with endovaginal exam following the transabdominal exam to visualize the endometrium and ovaries to better advantage. Color and duplex Doppler ultrasound was utilized to evaluate blood flow to the ovaries. COMPARISON:  Abdominal pelvic CT 08/17/2015. Images only from outside pelvic ultrasound 09/01/2015-no report. FINDINGS: Study is mildly limited by body habitus. Uterus Measurements: 9.1 x 3.6 x 4.2 cm. Mildly heterogeneous without focal fibroid. Cervical nabothian cysts noted. Endometrium Thickness: 6 mm.  No focal abnormality visualized. Right ovary Measurements: 5.2 x 3.7 x  6.0 cm. Several cystic lesions are again noted within the right  ovary, similar to the recent prior studies. The largest measures 3.5 x 3.1 x 3.2 cm. No solid mass demonstrated. There is blood flow peripherally within the right ovary on color Doppler. Left ovary Measurements: Not visualized. Pulsed Doppler evaluation of the right ovary demonstrates normal low-resistance arterial and venous waveforms. Left ovary not visualized. Other findings No free fluid. IMPRESSION: 1. Enlarged right ovary with several cysts, similar to recent outside study. 2. No evidence of ovarian torsion. 3. Left ovary not visualized. Electronically Signed   By: Richardean Sale M.D.   On: 09/11/2015 15:58   US Pelvis Complete  09/02/2015  GYNECOLOGIC SONOGRAM CARLEE VONDERHAAR is a 49 y.o. perimenopausal,no periods for 6 mons.,she is here for a pelvic sonogram for RLQ pain. Uterus                      8.8 x 3.6 x 3.6 cm, anteverted heterogenous uterus Endometrium          4 mm, symmetrical, wnl Right ovary             5.9 x 4.4 x 3.7 cm, rt ov contains two simple ovarian cysts (#1) 3.5 x 3.2 x 3.2,(#2) 2.3 x 2.1 x 2 cm Left ovary               Surgically absent Technician Comments: PELVIC US TA/TV: heterogenous anteverted uterus,lt oophorectomy,no free fluid seen,appears to be mobile,rt adnexal pain during [employer] spoke w/ pt after ultrasound regarding medication for pain. Amber Heide Guile 09/01/2015 2:50 PM Clinical Impression and recommendations: I have reviewed the sonogram results above. [place] has returned at 77, postmenopausal. Combined with the patient's current clinical course, below are my impressions and any appropriate recommendations for management based on the sonographic findings: 1 . Anteverted uterus, with thin endometrium, and normal left ovary 2. Several loculations in the area of the right ovary, and tube, with no internal blood flow on doppler studies suggesting benign nature. 3. Will add Ca-125 to evaluation, and  short interval follow up recommended , consideration of excision recommended. Shon Mansouri V  [place] Art/ven Flow Abd Pelv Doppler  09/11/2015  CLINICAL DATA:  Right pelvic pain for 2 days. History of ovarian cysts and possible left oophorectomy. Perimenopausal patient with no menses for 6 months. Evaluate for torsion. EXAM: TRANSABDOMINAL AND TRANSVAGINAL ULTRASOUND OF PELVIS DOPPLER ULTRASOUND OF OVARIES TECHNIQUE: Both transabdominal and transvaginal ultrasound examinations of the pelvis were performed. Transabdominal technique was performed for global imaging of the pelvis including uterus, ovaries, adnexal regions, and pelvic cul-de-sac. It was necessary to proceed with endovaginal exam following the transabdominal exam to visualize the endometrium and ovaries to better advantage. Color and duplex Doppler ultrasound was utilized to evaluate blood flow to the ovaries. COMPARISON:  Abdominal pelvic CT 08/17/2015. Images only from outside pelvic ultrasound 09/01/2015-no report. FINDINGS: Study is mildly limited by body habitus. Uterus Measurements: 9.1 x 3.6 x 4.2 cm. Mildly heterogeneous without focal fibroid. Cervical nabothian cysts noted. Endometrium Thickness: 6 mm.  No focal abnormality visualized. Right ovary Measurements: 5.2 x 3.7 x 6.0 cm. Several cystic lesions are again noted within the right ovary, similar to the recent prior studies. The largest measures 3.5 x 3.1 x 3.2 cm. No solid mass demonstrated. There is blood flow peripherally within the right ovary on color Doppler. Left ovary Measurements: Not visualized. Pulsed Doppler evaluation of the right ovary demonstrates normal low-resistance arterial and venous waveforms. Left ovary not visualized. Other findings No free fluid. IMPRESSION:  1. Enlarged right ovary with several cysts, similar to recent outside study. 2. No evidence of ovarian torsion. 3. Left ovary not visualized. Electronically Signed   By: Richardean Sale M.D.   On: 09/11/2015  15:58   Ct Renal Stone Study  08/17/2015  CLINICAL DATA:  Right flank pain. EXAM: CT ABDOMEN AND PELVIS WITHOUT CONTRAST TECHNIQUE: Multidetector CT imaging of the abdomen and pelvis was performed following the standard protocol without IV contrast. COMPARISON:  None. FINDINGS: Lower chest:  Clear lung bases.  Normal heart size. Hepatobiliary: Diffuse low attenuation of the liver as can be seen with hepatic steatosis. No focal hepatic mass. Normal gallbladder. Pancreas: Code normal. Spleen: Normal. Adrenals/Urinary Tract: Normal adrenal glands. Normal kidneys. No urolithiasis or obstructive uropathy. Normal bladder. Stomach/Bowel: No bowel wall thickening or dilatation. No pneumatosis, pneumoperitoneum or portal venous gas. No abdominal or pelvic free fluid. Normal appendix. Vascular/Lymphatic: Normal caliber abdominal aorta. No abdominal or pelvic lymphadenopathy. Reproductive: Normal uterus. 3.5 cm intermediate density right ovarian mass which may represent a hemorrhagic cyst or endometrioma. There is small amount of loculated fluid along the medial aspect of the right ovarian mass which may reflect a para ovarian cyst or hydrosalpinx. Other: No focal fluid collection or hematoma. Musculoskeletal: No acute osseous abnormality. Degenerative changes of bilateral sacroiliac joints. No aggressive lytic or sclerotic osseous lesion. Bilateral facet arthropathy at L3-4. IMPRESSION: 1. No urolithiasis or obstructive uropathy. 2. 3.5 cm intermediate density right ovarian mass which may represent a hemorrhagic cyst or endometrioma. Electronically Signed   By: Kathreen Devoid   On: 08/17/2015 13:15    Assessment: There are no active problems to display for this patient.   Plan: Patient will undergo surgical management with Laparoscopic right salpingo oophorectomy and left salpingo oophorectomy.    .mec 09/16/2015 11:41 AM  By signing my name below, I, Terressa Koyanagi, attest that this documentation has been  prepared under the direction and in the presence of Mallory Shirk, MD. Electronically Signed: Terressa Koyanagi, ED Scribe. 09/16/2015. 11:41 AM.   I personally performed the services described in this documentation, which was SCRIBED in my presence. The recorded information has been reviewed and considered accurate. It has been edited as necessary during review. Jonnie Kind, MD

## 2015-09-19 LAB — SPECIMEN STATUS REPORT

## 2015-09-27 LAB — CA 125: CA 125: 9.2 U/mL (ref 0.0–38.1)

## 2015-09-27 LAB — SPECIMEN STATUS REPORT

## 2015-09-29 ENCOUNTER — Telehealth: Payer: Self-pay | Admitting: Obstetrics and Gynecology

## 2015-09-29 ENCOUNTER — Other Ambulatory Visit: Payer: Self-pay | Admitting: Obstetrics and Gynecology

## 2015-09-29 NOTE — Patient Instructions (Signed)
Erica Greer  09/29/2015     @PREFPERIOPPHARMACY @   Your procedure is scheduled on 10/04/2015   Report to [facility] at  1045  A.M.  Call this number if you have problems the morning of surgery:  [phone]   Remember:  Do not eat food or drink liquids after midnight.  Take these medicines the morning of surgery with A SIP OF WATER  Percocet, ultram.   Do not wear jewelry, make-up or nail polish.  Do not wear lotions, powders, or perfumes.  You may wear deodorant.  Do not shave 48 hours prior to surgery.  Men may shave face and neck.  Do not bring valuables to the hospital.  [facility] is not responsible for any belongings or valuables.  Contacts, dentures or bridgework may not be worn into surgery.  Leave your suitcase in the car.  After surgery it may be brought to your room.  For patients admitted to the hospital, discharge time will be determined by your treatment team.  Patients discharged the day of surgery will not be allowed to drive home.   Name and phone number of your driver:   family Special instructions:  Follow the diet and prep instructions given to you by Dr Glo Herring.  Please read over the following fact sheets that you were given. Pain Booklet, Coughing and Deep Breathing, Surgical Site Infection Prevention, Anesthesia Post-op Instructions and Care and Recovery After Surgery      Salpingectomy Salpingectomy, also called tubectomy, is the surgical removal of one of the fallopian tubes. The fallopian tubes are tubes that are connected to the uterus. These tubes transport the egg from the ovary to the uterus. A salpingectomy may be done for various reasons, including:   A tubal (ectopic) pregnancy. This is especially true if the tube ruptures.  An infected fallopian tube.  The need to remove the fallopian tube when removing an ovary with a cyst or tumor.  The need to remove the fallopian tube when removing the uterus.  Cancer of the  fallopian tube or nearby organs. Removing one fallopian tube does not prevent you from becoming pregnant. It also does not cause problems with your menstrual periods.  LET [facility] CARE PROVIDER KNOW ABOUT:  Any allergies you have.  All medicines you are taking, including vitamins, herbs, eye drops, creams, and over-the-counter medicines.  Previous problems you or members of your family have had with the use of anesthetics.  Any blood disorders you have.  Previous surgeries you have had.  Medical conditions you have. RISKS AND COMPLICATIONS  Generally, this is a safe procedure. However, as with any procedure, complications can occur. Possible complications include:  Injury to surrounding organs.  Bleeding.  Infection.  Problems related to anesthesia. BEFORE THE PROCEDURE  Ask your health care provider about changing or stopping your regular medicines. You may need to stop taking certain medicines, such as aspirin or blood thinners, at least 1 week before the surgery.  Do not eat or drink anything for at least 8 hours before the surgery.  If you smoke, do not smoke for at least 2 weeks before the surgery.  Make plans to have someone drive you home after the procedure or after your hospital stay. Also arrange for someone to help you with activities during recovery. PROCEDURE   You will be given medicine to help you relax before the procedure (sedative). You will then be given medicine to make you  sleep through the procedure (general anesthetic). These medicines will be given through an IV access tube that is put into one of your veins.  Once you are asleep, your lower abdomen will be shaved and cleaned. A thin, flexible tube (catheter) will be placed in your bladder.  The surgeon may use a laparoscopic, robotic, or open technique for this surgery:  In the laparoscopic technique, the surgery is done through two small cuts (incisions) in the abdomen. A thin, lighted tube with  a tiny camera on the end (laparoscope) is inserted into one of the incisions. The tools needed for the procedure are put through the other incision.  A robotic technique may be chosen to perform complex surgery in a small space. In the robotic technique, small incisions will be made. A camera and surgical instruments are passed through the incisions. Surgical instruments will be controlled with the help of a robotic arm.  In the open technique, the surgery is done through one large incision in the abdomen.  Using any of these techniques, the surgeon removes the fallopian tube from where it attaches to the uterus. The blood vessels will be clamped and tied.  The surgeon then uses staples or stitches to close the incision or incisions. AFTER THE PROCEDURE   You will be taken to a recovery area where your progress will be monitored for 1-3 hours.  If the laparoscopic technique was used, you may be allowed to go home after several hours. You may have some shoulder pain after the laparoscopic procedure. This is normal and usually goes away in a day or two.  If the open technique was used, you will be admitted to the hospital for a couple of days.  You will be given pain medicine if needed.  The IV access tube and catheter will be removed before you are discharged.   This information is not intended to replace advice given to you by your health care provider. Make sure you discuss any questions you have with your health care provider.   Document Released: 03/17/2009 Document Revised: 11/19/2014 Document Reviewed: 04/22/2013 Elsevier Interactive Patient Education 2016 Else[employer] Unilateral Salpingo-Oophorectomy Unilateral salpingo-oophorectomy is the surgical removal of one fallopian tube and ovary. The ovaries are small organs that produce eggs in women. The fallopian tubes transport the egg from the ovary to the womb (uterus). A unilateral salpingo-oophorectomy may be done for various  reasons, including:  Infection in the fallopian tube and ovary.  Scar tissue in the fallopian tube and ovary (adhesions).  A cyst or tumor on the ovary.  A need to remove the fallopian tube and ovary when removing the uterus.  Cancer of the fallopian tube or ovary. The removal of one fallopian tube and ovary will not prevent you from becoming pregnant, put you into menopause, or cause problems with your menstrual periods or sex drive. LET [facility] CARE PROVIDER KNOW ABOUT:  Any allergies you have.  All medicines you are taking, including vitamins, herbs, eye drops, creams, and over-the-counter medicines.  Previous problems you or members of your family have had with the use of anesthetics.  Any blood disorders you have.  Previous surgeries you have had.  Medical conditions you have. RISKS AND COMPLICATIONS  Generally, this is a safe procedure. However, as with any procedure, complications can occur. Possible complications include:  Injury to surrounding organs.  Bleeding.  Infection.  Blood clots in the legs or lungs.  Problems related to anesthesia. BEFORE THE PROCEDURE  Ask your  health care provider about changing or stopping your regular medicines. You may need to stop taking certain medicines, such as aspirin or blood thinners, at least 1 week before the surgery.  Do not eat or drink anything for at least 8 hours before the surgery.  If you smoke, do not smoke for at least 2 weeks before the surgery.  Make plans to have someone drive you home after the procedure or after your hospital stay. Also arrange for someone to help you with activities during recovery. PROCEDURE  You will be given medicine to help you relax before the procedure (sedative). You will then be given medicine to make you sleep through the procedure (general anesthetic). These medicines will be given through an IV access tube that is put into one of your veins.  Once you are asleep, your  lower abdomen will be shaved and cleaned. A thin, flexible tube (catheter) will be placed in your bladder.  The surgeon may use a laparoscopic, robotic, or open technique for this surgery:  In the laparoscopic technique, the surgery is done through two small cuts (incisions) in the abdomen. A thin, lighted tube with a tiny camera on the end (laparoscope) is inserted into one of the incisions. The tools needed for the procedure are put through the other incision.  A robotic technique may be chosen to perform complex surgery in a small space. In the robotic technique, small incisions are made. A camera and surgical instruments are passed through the incisions. Surgical instruments are controlled with the help of a robotic arm.  In the open technique, the surgery is done through one large incision in the abdomen.  Using any of these techniques, the surgeon will remove the fallopian tube and ovary. The blood vessels will be clamped and tied.  The surgeon will then use staples or stitches to close the incision or incisions. AFTER THE PROCEDURE  You will be taken to a recovery area where your progress will be monitored for 1-3 hours. Your blood pressure, pulse, and temperature will be checked often. You will remain in the recovery area until you are stable and waking up.  If the laparoscopic technique was used, you may be allowed to go home after several hours. You may have some shoulder pain. This is normal and usually goes away in a day or two.  If the open technique was used, you will be admitted to the hospital for a couple of days.  You will be given pain medicine as necessary.  The IV tube and catheter will be removed before you are discharged.   This information is not intended to replace advice given to you by your health care provider. Make sure you discuss any questions you have with your health care provider.   Document Released: 08/26/2009 Document Revised: 11/03/2013 Document  Reviewed: 04/22/2013 Elsevier Interactive Patient Education 2016 [employer] PATIENT INSTRUCTIONS POST-ANESTHESIA  IMMEDIATELY FOLLOWING SURGERY:  Do not drive or operate machinery for the first twenty four hours after surgery.  Do not make any important decisions for twenty four hours after surgery or while taking narcotic pain medications or sedatives.  If you develop intractable nausea and vomiting or a severe headache please notify your doctor immediately.  FOLLOW-UP:  Please make an appointment with your surgeon as instructed. You do not need to follow up with anesthesia unless specifically instructed to do so.  WOUND CARE INSTRUCTIONS (if applicable):  Keep a dry clean dressing on the anesthesia/puncture wound site if there is  drainage.  Once the wound has quit draining you may leave it open to air.  Generally you should leave the bandage intact for twenty four hours unless there is drainage.  If the epidural site drains for more than 36-48 hours please call the anesthesia department.  QUESTIONS?:  Please feel free to call your physician or the hospital operator if you have any questions, and they will be happy to assist you.

## 2015-09-29 NOTE — Telephone Encounter (Signed)
Pt called to confirm surgery. My recall is that surgery is a right salpingoophorectomy and left salpingectomy. Pt will call me back to confirm.

## 2015-09-30 ENCOUNTER — Encounter (HOSPITAL_COMMUNITY): Payer: Self-pay

## 2015-09-30 ENCOUNTER — Encounter (HOSPITAL_COMMUNITY)
Admission: RE | Admit: 2015-09-30 | Discharge: 2015-09-30 | Disposition: A | Discharge status: Home / Self Care | Payer: 59 | Source: Ambulatory Visit | Attending: Obstetrics and Gynecology | Admitting: Obstetrics and Gynecology

## 2015-09-30 ENCOUNTER — Telehealth: Payer: Self-pay | Admitting: Obstetrics and Gynecology

## 2015-09-30 DIAGNOSIS — R102 Pelvic and perineal pain: Secondary | ICD-10-CM | POA: Insufficient documentation

## 2015-09-30 DIAGNOSIS — R109 Unspecified abdominal pain: Secondary | ICD-10-CM | POA: Insufficient documentation

## 2015-09-30 DIAGNOSIS — Z01818 Encounter for other preprocedural examination: Secondary | ICD-10-CM | POA: Diagnosis present

## 2015-09-30 LAB — CBC
HEMATOCRIT: 38.4 % (ref 36.0–46.0)
HEMOGLOBIN: 12.5 g/dL (ref 12.0–15.0)
MCH: 25.3 pg — AB (ref 26.0–34.0)
MCHC: 32.6 g/dL (ref 30.0–36.0)
MCV: 77.6 fL — ABNORMAL LOW (ref 78.0–100.0)
Platelets: 189 10*3/uL (ref 150–400)
RBC: 4.95 MIL/uL (ref 3.87–5.11)
RDW: 13.6 % (ref 11.5–15.5)
WBC: 9.3 10*3/uL (ref 4.0–10.5)

## 2015-09-30 LAB — COMPREHENSIVE METABOLIC PANEL
ALBUMIN: 4.1 g/dL (ref 3.5–5.0)
ALK PHOS: 53 U/L (ref 38–126)
ALT: 27 U/L (ref 14–54)
AST: 22 U/L (ref 15–41)
Anion gap: 6 (ref 5–15)
BILIRUBIN TOTAL: 0.5 mg/dL (ref 0.3–1.2)
BUN: 11 mg/dL (ref 6–20)
CALCIUM: 9.2 mg/dL (ref 8.9–10.3)
CO2: 30 mmol/L (ref 22–32)
CREATININE: 0.71 mg/dL (ref 0.44–1.00)
Chloride: 107 mmol/L (ref 101–111)
GFR calc Af Amer: >60 mL/min (ref >60)
GFR calc non Af Amer: >60 mL/min (ref >60)
GLUCOSE: 100 mg/dL — AB (ref 65–99)
Potassium: 4.5 mmol/L (ref 3.5–5.1)
SODIUM: 143 mmol/L (ref 135–145)
TOTAL PROTEIN: 6.9 g/dL (ref 6.5–8.1)

## 2015-09-30 LAB — URINALYSIS, ROUTINE W REFLEX MICROSCOPIC
BILIRUBIN URINE: NEGATIVE
GLUCOSE, UA: NEGATIVE mg/dL
HGB URINE DIPSTICK: NEGATIVE
Ketones, ur: NEGATIVE mg/dL
Leukocytes, UA: NEGATIVE
Nitrite: NEGATIVE
PH: 7 (ref 5.0–8.0)
Protein, ur: NEGATIVE mg/dL

## 2015-09-30 LAB — HCG, SERUM, QUALITATIVE: Preg, Serum: NEGATIVE

## 2015-09-30 NOTE — Telephone Encounter (Signed)
Pt called stating that Dr. Glo Herring call her yesterday evening to confirmed her Surgery appointment. Pt states that she has done her blood work this morning and she will be there for her surgery. Just an update.

## 2015-10-04 ENCOUNTER — Ambulatory Visit (HOSPITAL_COMMUNITY): Payer: 59 | Admitting: Anesthesiology

## 2015-10-04 ENCOUNTER — Encounter (HOSPITAL_COMMUNITY): Payer: Self-pay | Admitting: *Deleted

## 2015-10-04 ENCOUNTER — Encounter (HOSPITAL_COMMUNITY)
Admission: RE | Disposition: A | Discharge status: Home / Self Care | Payer: Self-pay | Source: Ambulatory Visit | Attending: Obstetrics and Gynecology

## 2015-10-04 ENCOUNTER — Ambulatory Visit (HOSPITAL_COMMUNITY)
Admission: RE | Admit: 2015-10-04 | Discharge: 2015-10-04 | Disposition: A | Discharge status: Home / Self Care | Payer: 59 | Source: Ambulatory Visit | Attending: Obstetrics and Gynecology | Admitting: Obstetrics and Gynecology

## 2015-10-04 DIAGNOSIS — N7011 Chronic salpingitis: Secondary | ICD-10-CM | POA: Diagnosis not present

## 2015-10-04 DIAGNOSIS — Z809 Family history of malignant neoplasm, unspecified: Secondary | ICD-10-CM | POA: Insufficient documentation

## 2015-10-04 DIAGNOSIS — N839 Noninflammatory disorder of ovary, fallopian tube and broad ligament, unspecified: Secondary | ICD-10-CM | POA: Diagnosis not present

## 2015-10-04 DIAGNOSIS — D270 Benign neoplasm of right ovary: Secondary | ICD-10-CM | POA: Diagnosis not present

## 2015-10-04 DIAGNOSIS — Z820 Family history of epilepsy and other diseases of the nervous system: Secondary | ICD-10-CM | POA: Insufficient documentation

## 2015-10-04 DIAGNOSIS — Z780 Asymptomatic menopausal state: Secondary | ICD-10-CM | POA: Insufficient documentation

## 2015-10-04 DIAGNOSIS — D282 Benign neoplasm of uterine tubes and ligaments: Secondary | ICD-10-CM | POA: Diagnosis not present

## 2015-10-04 DIAGNOSIS — Z79899 Other long term (current) drug therapy: Secondary | ICD-10-CM | POA: Diagnosis not present

## 2015-10-04 DIAGNOSIS — N9489 Other specified conditions associated with female genital organs and menstrual cycle: Secondary | ICD-10-CM | POA: Diagnosis not present

## 2015-10-04 DIAGNOSIS — N854 Malposition of uterus: Secondary | ICD-10-CM | POA: Insufficient documentation

## 2015-10-04 DIAGNOSIS — R102 Pelvic and perineal pain: Secondary | ICD-10-CM | POA: Insufficient documentation

## 2015-10-04 DIAGNOSIS — N838 Other noninflammatory disorders of ovary, fallopian tube and broad ligament: Secondary | ICD-10-CM | POA: Diagnosis not present

## 2015-10-04 DIAGNOSIS — Z833 Family history of diabetes mellitus: Secondary | ICD-10-CM | POA: Diagnosis not present

## 2015-10-04 DIAGNOSIS — N83201 Unspecified ovarian cyst, right side: Secondary | ICD-10-CM | POA: Diagnosis not present

## 2015-10-04 HISTORY — PX: LAPAROSCOPIC SALPINGO OOPHERECTOMY: SHX5927

## 2015-10-04 HISTORY — PX: LAPAROSCOPIC UNILATERAL SALPINGECTOMY: SHX5934

## 2015-10-04 SURGERY — SALPINGO-OOPHORECTOMY, LAPAROSCOPIC
Anesthesia: General | Laterality: Right

## 2015-10-04 MED ORDER — LACTATED RINGERS IV SOLN
INTRAVENOUS | Status: DC
  Administered 2015-10-04: 12:00:00 via INTRAVENOUS

## 2015-10-04 MED ORDER — OXYCODONE-ACETAMINOPHEN 5-325 MG PO TABS: 1.0000 | ORAL_TABLET | ORAL | Status: DC | PRN

## 2015-10-04 MED ORDER — PROPOFOL 10 MG/ML IV BOLUS
INTRAVENOUS | Status: AC
  Filled 2015-10-04: qty 20

## 2015-10-04 MED ORDER — BUPIVACAINE HCL (PF) 0.5 % IJ SOLN
INTRAMUSCULAR | Status: DC | PRN
  Administered 2015-10-04: 7 mL

## 2015-10-04 MED ORDER — FENTANYL CITRATE (PF) 100 MCG/2ML IJ SOLN
25.0000 ug | INTRAMUSCULAR | Status: DC | PRN
  Administered 2015-10-04 (×2): 50 ug via INTRAVENOUS

## 2015-10-04 MED ORDER — DEXAMETHASONE SODIUM PHOSPHATE 4 MG/ML IJ SOLN
4.0000 mg | Freq: Once | INTRAMUSCULAR | Status: AC
  Administered 2015-10-04: 4 mg via INTRAVENOUS
  Filled 2015-10-04: qty 1

## 2015-10-04 MED ORDER — LIDOCAINE HCL (PF) 1 % IJ SOLN
INTRAMUSCULAR | Status: AC
  Filled 2015-10-04: qty 5

## 2015-10-04 MED ORDER — LACTATED RINGERS IV SOLN
INTRAVENOUS | Status: DC | PRN
  Administered 2015-10-04 (×2): via INTRAVENOUS

## 2015-10-04 MED ORDER — GLYCOPYRROLATE 0.2 MG/ML IJ SOLN
INTRAMUSCULAR | Status: DC | PRN
  Administered 2015-10-04: 0.6 mg via INTRAVENOUS

## 2015-10-04 MED ORDER — NEOSTIGMINE METHYLSULFATE 10 MG/10ML IV SOLN
INTRAVENOUS | Status: DC | PRN
  Administered 2015-10-04: 4 mg via INTRAVENOUS

## 2015-10-04 MED ORDER — FENTANYL CITRATE (PF) 100 MCG/2ML IJ SOLN
INTRAMUSCULAR | Status: AC
  Filled 2015-10-04: qty 2

## 2015-10-04 MED ORDER — CEFAZOLIN SODIUM-DEXTROSE 2-3 GM-% IV SOLR
2.0000 g | INTRAVENOUS | Status: DC
  Filled 2015-10-04: qty 50

## 2015-10-04 MED ORDER — CEFAZOLIN SODIUM 1-5 GM-% IV SOLN
1.0000 g | INTRAVENOUS | Status: AC
  Administered 2015-10-04: 1 g via INTRAVENOUS
  Administered 2015-10-04: 2 g via INTRAVENOUS
  Filled 2015-10-04: qty 50

## 2015-10-04 MED ORDER — ONDANSETRON HCL 4 MG/2ML IJ SOLN
4.0000 mg | Freq: Once | INTRAMUSCULAR | Status: AC | PRN
  Administered 2015-10-04: 4 mg via INTRAVENOUS
  Filled 2015-10-04: qty 2

## 2015-10-04 MED ORDER — PROPOFOL 10 MG/ML IV BOLUS
INTRAVENOUS | Status: DC | PRN
  Administered 2015-10-04: 150 mg via INTRAVENOUS

## 2015-10-04 MED ORDER — FENTANYL CITRATE (PF) 100 MCG/2ML IJ SOLN
INTRAMUSCULAR | Status: DC | PRN
  Administered 2015-10-04 (×5): 50 ug via INTRAVENOUS

## 2015-10-04 MED ORDER — FENTANYL CITRATE (PF) 250 MCG/5ML IJ SOLN
INTRAMUSCULAR | Status: AC
Start: 2015-10-04 — End: 2015-10-04
  Filled 2015-10-04: qty 5

## 2015-10-04 MED ORDER — ROCURONIUM BROMIDE 100 MG/10ML IV SOLN
INTRAVENOUS | Status: DC | PRN
  Administered 2015-10-04 (×2): 5 mg via INTRAVENOUS
  Administered 2015-10-04: 30 mg via INTRAVENOUS

## 2015-10-04 MED ORDER — LIDOCAINE HCL (CARDIAC) 20 MG/ML IV SOLN
INTRAVENOUS | Status: DC | PRN
  Administered 2015-10-04: 50 mg via INTRAVENOUS

## 2015-10-04 MED ORDER — SUCCINYLCHOLINE CHLORIDE 20 MG/ML IJ SOLN
INTRAMUSCULAR | Status: AC
  Filled 2015-10-04: qty 1

## 2015-10-04 MED ORDER — GLYCOPYRROLATE 0.2 MG/ML IJ SOLN
INTRAMUSCULAR | Status: AC
  Filled 2015-10-04: qty 3

## 2015-10-04 MED ORDER — BUPIVACAINE HCL (PF) 0.5 % IJ SOLN
INTRAMUSCULAR | Status: AC
  Filled 2015-10-04: qty 30

## 2015-10-04 MED ORDER — MIDAZOLAM HCL 2 MG/2ML IJ SOLN
1.0000 mg | INTRAMUSCULAR | Status: DC | PRN
  Administered 2015-10-04: 2 mg via INTRAVENOUS
  Filled 2015-10-04: qty 2

## 2015-10-04 MED ORDER — NEOSTIGMINE METHYLSULFATE 10 MG/10ML IV SOLN
INTRAVENOUS | Status: AC
  Filled 2015-10-04: qty 1

## 2015-10-04 MED ORDER — DEXTROSE 5 % IV SOLN
3.0000 g | INTRAVENOUS | Status: DC
  Filled 2015-10-04: qty 3000

## 2015-10-04 MED ORDER — 0.9 % SODIUM CHLORIDE (POUR BTL) OPTIME
TOPICAL | Status: DC | PRN
  Administered 2015-10-04: 1000 mL

## 2015-10-04 MED ORDER — ROCURONIUM BROMIDE 50 MG/5ML IV SOLN
INTRAVENOUS | Status: AC
  Filled 2015-10-04: qty 1

## 2015-10-04 MED ORDER — ONDANSETRON HCL 4 MG/2ML IJ SOLN
4.0000 mg | Freq: Once | INTRAMUSCULAR | Status: AC
  Administered 2015-10-04: 4 mg via INTRAVENOUS
  Filled 2015-10-04: qty 2

## 2015-10-04 SURGICAL SUPPLY — 53 items
BAG HAMPER (MISCELLANEOUS) ×4 IMPLANT
BAG SPEC RTRVL LRG 6X4 10 (ENDOMECHANICALS) ×2
BANDAGE STRIP 1X3 FLEXIBLE (GAUZE/BANDAGES/DRESSINGS) ×16 IMPLANT
BLADE SURG SZ11 CARB STEEL (BLADE) ×4 IMPLANT
CLOSURE WOUND 1/4 X3 (GAUZE/BANDAGES/DRESSINGS) ×1
CLOTH BEACON ORANGE TIMEOUT ST (SAFETY) ×4 IMPLANT
COVER LIGHT HANDLE STERIS (MISCELLANEOUS) ×8 IMPLANT
DECANTER SPIKE VIAL GLASS SM (MISCELLANEOUS) ×4 IMPLANT
DURAPREP 26ML APPLICATOR (WOUND CARE) ×4 IMPLANT
ELECT REM PT RETURN 9FT ADLT (ELECTROSURGICAL) ×4
ELECTRODE REM PT RTRN 9FT ADLT (ELECTROSURGICAL) ×2 IMPLANT
FILTER SMOKE EVAC LAPAROSHD (FILTER) ×4 IMPLANT
FORMALIN 10 PREFIL 480ML (MISCELLANEOUS) ×4 IMPLANT
GAUZE SPONGE 4X4 16PLY XRAY LF (GAUZE/BANDAGES/DRESSINGS) ×4 IMPLANT
GLOVE BIO SURGEON STRL SZ 6.5 (GLOVE) ×3 IMPLANT
GLOVE BIO SURGEONS STRL SZ 6.5 (GLOVE) ×1
GLOVE BIOGEL PI IND STRL 7.0 (GLOVE) ×6 IMPLANT
GLOVE BIOGEL PI IND STRL 7.5 (GLOVE) ×2 IMPLANT
GLOVE BIOGEL PI IND STRL 9 (GLOVE) ×2 IMPLANT
GLOVE BIOGEL PI INDICATOR 7.0 (GLOVE) ×6
GLOVE BIOGEL PI INDICATOR 7.5 (GLOVE) ×2
GLOVE BIOGEL PI INDICATOR 9 (GLOVE) ×2
GLOVE ECLIPSE 9.0 STRL (GLOVE) ×4 IMPLANT
GOWN SPEC L3 XXLG W/TWL (GOWN DISPOSABLE) ×4 IMPLANT
GOWN STRL REUS W/TWL LRG LVL3 (GOWN DISPOSABLE) ×8 IMPLANT
INST SET LAPROSCOPIC GYN AP (KITS) ×4 IMPLANT
KIT ROOM TURNOVER AP CYSTO (KITS) ×4 IMPLANT
KIT ROOM TURNOVER APOR (KITS) ×4 IMPLANT
MANIFOLD NEPTUNE II (INSTRUMENTS) ×4 IMPLANT
NEEDLE HYPO 25X1 1.5 SAFETY (NEEDLE) ×4 IMPLANT
NEEDLE INSUFFLATION 14GA 120MM (NEEDLE) ×4 IMPLANT
NS IRRIG 1000ML POUR BTL (IV SOLUTION) ×4 IMPLANT
PACK PERI GYN (CUSTOM PROCEDURE TRAY) ×4 IMPLANT
PAD ARMBOARD 7.5X6 YLW CONV (MISCELLANEOUS) ×4 IMPLANT
POUCH SPECIMEN RETRIEVAL 10MM (ENDOMECHANICALS) ×4 IMPLANT
SET BASIN LINEN APH (SET/KITS/TRAYS/PACK) ×4 IMPLANT
SHEARS HARMONIC ACE PLUS 36CM (ENDOMECHANICALS) ×4 IMPLANT
SLEEVE ENDOPATH XCEL 5M (ENDOMECHANICALS) ×8 IMPLANT
SOLUTION ANTI FOG 6CC (MISCELLANEOUS) ×4 IMPLANT
STRIP CLOSURE SKIN 1/4X3 (GAUZE/BANDAGES/DRESSINGS) ×3 IMPLANT
SUT VIC AB 4-0 PS2 27 (SUTURE) ×4 IMPLANT
SUT VICRYL 0 UR6 27IN ABS (SUTURE) ×4 IMPLANT
SYR BULB IRRIGATION 50ML (SYRINGE) ×4 IMPLANT
SYRINGE 10CC LL (SYRINGE) ×4 IMPLANT
TOWEL OR 17X26 4PK STRL BLUE (TOWEL DISPOSABLE) ×4 IMPLANT
TRAP SPECIMEN MUCOUS 40CC (MISCELLANEOUS) ×4 IMPLANT
TRAY FOLEY CATH SILVER 16FR (SET/KITS/TRAYS/PACK) ×4 IMPLANT
TROCAR ENDO BLADELESS 11MM (ENDOMECHANICALS) ×4 IMPLANT
TROCAR XCEL NON-BLD 5MMX100MML (ENDOMECHANICALS) ×4 IMPLANT
TROCAR XCEL UNIV SLVE 11M 100M (ENDOMECHANICALS) ×4 IMPLANT
TUBING INSUFFLATION (TUBING) ×4 IMPLANT
TUBING INSUFFLATION 10FT LAP (TUBING) ×4 IMPLANT
WARMER LAPAROSCOPE (MISCELLANEOUS) ×4 IMPLANT

## 2015-10-04 NOTE — Interval H&P Note (Signed)
History and Physical Interval Note:  10/04/2015 11:35 AM  Erica Greer  has presented today for surgery, with the diagnosis of abdominal pain pelvic pain  The various methods of treatment have been discussed with the patient and family. After consideration of risks, benefits and other options for treatment, the patient has consented to  Procedure(s): LAPAROSCOPIC SALPINGO OOPHORECTOMY (Right) LAPAROSCOPIC UNILATERAL SALPINGECTOMY (Left) as a surgical intervention .  The patient's history has been reviewed, patient examined, no change in status, stable for surgery.  I have reviewed the patient's chart and labs.  Questions were answered to the patient's satisfaction.  Pap smears have been normal thru the primary care office.   Jonnie Kind

## 2015-10-04 NOTE — Anesthesia Preprocedure Evaluation (Signed)
Anesthesia Evaluation  Patient identified by MRN, date of birth, ID band Patient awake    Reviewed: Allergy & Precautions, NPO status , Patient's Chart, lab work & pertinent test results  Airway Mallampati: II  TM Distance: >3 FB     Dental  (+) Teeth Intact   Pulmonary neg pulmonary ROS,    breath sounds clear to auscultation       Cardiovascular negative cardio ROS   Rhythm:Regular Rate:Normal     Neuro/Psych    GI/Hepatic negative GI ROS,   Endo/Other    Renal/GU      Musculoskeletal   Abdominal   Peds  Hematology   Anesthesia Other Findings   Reproductive/Obstetrics                             Anesthesia Physical Anesthesia Plan  ASA: I  Anesthesia Plan: General   Post-op Pain Management:    Induction: Intravenous  Airway Management Planned: Oral ETT  Additional Equipment:   Intra-op Plan:   Post-operative Plan: Extubation in OR  Informed Consent: I have reviewed the patients History and Physical, chart, labs and discussed the procedure including the risks, benefits and alternatives for the proposed anesthesia with the patient or authorized representative who has indicated his/her understanding and acceptance.     Plan Discussed with:   Anesthesia Plan Comments:         Anesthesia Quick Evaluation

## 2015-10-04 NOTE — Discharge Instructions (Signed)
Diagnostic Laparoscopy A diagnostic laparoscopy is a procedure to diagnose diseases in the abdomen. During the procedure, a thin, lighted, pencil-sized instrument called a laparoscope is inserted into the abdomen through an incision. The laparoscope allows your health care provider to look at the organs inside your body. LET [facility] CARE PROVIDER KNOW ABOUT:  Any allergies you have.  All medicines you are taking, including vitamins, herbs, eye drops, creams, and over-the-counter medicines.  Previous problems you or members of your family have had with the use of anesthetics.  Any blood disorders you have.  Previous surgeries you have had.  Medical conditions you have. RISKS AND COMPLICATIONS  Generally, this is a safe procedure. However, problems can occur, which may include:  Infection.  Bleeding.  Damage to other organs.  Allergic reaction to the anesthetics used during the procedure. BEFORE THE PROCEDURE  Do not eat or drink anything after midnight on the night before the procedure or as directed by your health care provider.  Ask your health care provider about:  Changing or stopping your regular medicines.  Taking medicines such as aspirin and ibuprofen. These medicines can thin your blood. Do not take these medicines before your procedure if your health care provider instructs you not to.  Plan to have someone take you home after the procedure. PROCEDURE  You may be given a medicine to help you relax (sedative).  You will be given a medicine to make you sleep (general anesthetic).  Your abdomen will be inflated with a gas. This will make your organs easier to see.  Small incisions will be made in your abdomen.  A laparoscope and other small instruments will be inserted into the abdomen through the incisions.  A tissue sample may be removed from an organ in the abdomen for examination.  The instruments will be removed from the abdomen.  The gas will be  released.  The incisions will be closed with stitches (sutures). AFTER THE PROCEDURE  Your blood pressure, heart rate, breathing rate, and blood oxygen level will be monitored often until the medicines you were given have worn off.   This information is not intended to replace advice given to you by your health care provider. Make sure you discuss any questions you have with your health care provider.   Document Released: 02/04/2001 Document Revised: 07/20/2015 Document Reviewed: 06/11/2014 Elsevier Interactive Patient Education 2016 [employer] Inc    . PATIENT INSTRUCTIONS POST-ANESTHESIA  IMMEDIATELY FOLLOWING SURGERY:  Do not drive or operate machinery for the first twenty four hours after surgery.  Do not make any important decisions for twenty four hours after surgery or while taking narcotic pain medications or sedatives.  If you develop intractable nausea and vomiting or a severe headache please notify your doctor immediately.  FOLLOW-UP:  Please make an appointment with your surgeon as instructed. You do not need to follow up with anesthesia unless specifically instructed to do so.  WOUND CARE INSTRUCTIONS (if applicable):  Keep a dry clean dressing on the anesthesia/puncture wound site if there is drainage.  Once the wound has quit draining you may leave it open to air.  Generally you should leave the bandage intact for twenty four hours unless there is drainage.  If the epidural site drains for more than 36-48 hours please call the anesthesia department.  QUESTIONS?:  Please feel free to call your physician or the hospital operator if you have any questions, and they will be happy to assist you.

## 2015-10-04 NOTE — Op Note (Signed)
Please see details of procedure and the brief operative note

## 2015-10-04 NOTE — Brief Op Note (Signed)
10/04/2015  1:29 PM  PATIENT:  Erica Greer  49 y.o. female  PRE-OPERATIVE DIAGNOSIS:  abdominal pain pelvic pain  POST-OPERATIVE DIAGNOSIS:  Left Hydrosalpinx, Right Ovarian Mass, probable intermittent torsion  PROCEDURE:  Procedure(s): LAPAROSCOPIC RIGHT SALPINGO OOPHORECTOMY (Right) LAPAROSCOPIC LEFT SALPINGECTOMY (Left)  SURGEON:  Surgeon(s) and Role:    * Jonnie Kind, MD - Primary  PHYSICIAN ASSISTANT:   ASSISTANTS: Angela with CST   ANESTHESIA:   local and general  EBL:  Total I/O In: 1100 [I.V.:1100] Out: 160 [Urine:150; Blood:10]  BLOOD ADMINISTERED:none  DRAINS: Foley catheter during the case, removed it at the end of procedure   LOCAL MEDICATIONS USED:  MARCAINE    and Amount: 20 ml  SPECIMEN:  Source of Specimen:  Right fallopian tube and right ovary, left fallopian tube. Evidence of prior oophorectomy on left and Aspirate  DISPOSITION OF SPECIMEN:  PATHOLOGY  COUNTS:  YES  TOURNIQUET:  * No tourniquets in log *  DICTATION: .Dragon Dictation  PLAN OF CARE: Discharge to home after PACU  PATIENT DISPOSITION:  PACU - hemodynamically stable.   Delay start of Pharmacological VTE agent (>24hrs) due to surgical blood loss or risk of bleeding: not applicable Details of procedure: Patient was taken operating room prepped and draped for abdominal and vaginal procedure with Foley catheter in place, Hulka uterine manipulator attached to the cervix, with Ancef administered 3 g and timeout conducted with surgical team confirming the procedure. An infraumbilical vertical once in his skin incision was made a 3 cm transverse suprapubic incision site of prior oophorectomies incision, and then lateral 5 mm trocar sites and each lower quadrant. A Veress needle was introduced through the umbilicus, water droplet technique confirmed intraperitoneal location, and insufflation to 3 L CO2 was then performed. Suprapubic and lower quadrant trochars were inserted under direct  visualization patient was placed in Trendelenburg position and attention to the pelvis per identified the right ovary is a irregular multicystic structure that was flipped in front of the risks, suggestive of torsion it was not acutely in torsion at this time but there was venous congestion and went flipped back into more anatomic position the ovary veins decompressed. Inspection the left adnexa showed some adhesions from to the epiploic fat of the bowel and a left hydrosalpinx. There was no left ovary the epiploic fat adhesions were removed and then a Harmonic scalpel used to perform left salpingectomy using the harmonic Ace 7 egg ablation device to transect across the mesosalpinx. Specimen was removed through the suprapubic site. Attention was then directed to the right side where the right tube and ovary Could be easily accessed were flipped anterior to the uterus and infundibulopelvic ligament on the right could be easily accessed. The case 7 device was again used to transect the IP ligament close to the ovary, well away from retroperitoneal structures and then the utero-ovarian ligament was similarly transected and specimen extracted. Pubic site which had to be enlarged. The Endo Catch bag was first used to snare the specimen, which was retracted into the incision, with the fascia opened a distance of about 4 cm which allowed extraction of the mass with some difficulty. Pelvis had been irrigated and fluid was sent as a separate cytology. Additional fluid was left in the abdomen, instruments removed sleeves removed and the umbilicus and suprapubic sites closed at the fascial layer with 0 Vicryl and then all 4 ports closed subcuticular 4-0 Vicryl. Sponge and needle counts were correct and patient recovery room in good  condition.

## 2015-10-04 NOTE — Anesthesia Procedure Notes (Signed)
Procedure Name: Intubation Date/Time: 10/04/2015 12:03 PM Performed by: Andree Elk, Tomeshia Pizzi A Pre-anesthesia Checklist: Patient identified, Patient being monitored, Timeout performed, Emergency Drugs available and Suction available Patient Re-evaluated:Patient Re-evaluated prior to inductionOxygen Delivery Method: Circle System Utilized Preoxygenation: Pre-oxygenation with 100% oxygen Intubation Type: IV induction Ventilation: Mask ventilation without difficulty Laryngoscope Size: 3 and Miller Grade View: Grade I Tube type: Oral Tube size: 7.0 mm Number of attempts: 1 Airway Equipment and Method: Stylet Placement Confirmation: ETT inserted through vocal cords under direct vision,  positive ETCO2 and breath sounds checked- equal and bilateral Secured at: 21 cm Tube secured with: Tape Dental Injury: Teeth and Oropharynx as per pre-operative assessment

## 2015-10-04 NOTE — Transfer of Care (Signed)
Immediate Anesthesia Transfer of Care Note  Patient: Erica Greer  Procedure(s) Performed: Procedure(s): LAPAROSCOPIC RIGHT SALPINGO OOPHORECTOMY (Right) LAPAROSCOPIC LEFT SALPINGECTOMY (Left)  Patient Location: PACU  Anesthesia Type:General  Level of Consciousness: awake, oriented and patient cooperative  Airway & Oxygen Therapy: Patient Spontanous Breathing and Patient connected to face mask oxygen  Post-op Assessment: Report given to RN and Post -op Vital signs reviewed and stable  Post vital signs: Reviewed and stable  Last Vitals:  Filed Vitals:   10/04/15 1140 10/04/15 1145  BP: 117/71 129/71  Temp:    Resp: 16 15    Complications: No apparent anesthesia complications

## 2015-10-04 NOTE — Telephone Encounter (Signed)
Surgical procedure completed today

## 2015-10-04 NOTE — H&P (View-Only) (Signed)
Erica M Travis, LPN at 09/16/2015 10:30 AM     Status: Signed       Expand All Collapse All   Patient ID: Erica Greer, female DOB: 06/01/1966, 48 y.o. MRN: [id number] Pt here today for follow up. Pt states that her pain got bad and she had to go to the ED on Sunday.             Brach Birdsall V, MD at 09/16/2015 10:57 AM     Status: Signed       Expand All Collapse All   Patient ID: Erica Greer, female DOB: 01/20/1966, 48 y.o. MRN: [id number]  Preoperative History and Physical  Erica Greer is a 48 y.o. No obstetric history on file. here for surgical management of rt adx pain. Pt states she has been managing her pain with tramadol and percocet with some relief. Pt reports she went to the ED 5 days ago for worsening pain. An ultrasound was completed at the ED that showed the following:  Right Ovary: 5.2 x 3.7 x 6.0 cm. Several cystic lesions are again noted within the right ovary, similar to the recent prior studies. The largest measures 3.5 x 3.1 x 3.2 cm. No solid mass demonstrated. There is blood flow peripherally within the right ovary on color Doppler.  No significant preoperative concerns.  Proposed surgery: Laparoscopic right salpingo oophorectomy and left salpingectomy  Past Medical History  Diagnosis Date  . Ovarian cyst     right    Past Surgical History  Procedure Laterality Date  . Tubal ligation    . Tubal ligation    . Ovarian cyst removal Left    OB History  No data available  Patient denies any other pertinent gynecologic issues.   Current Outpatient Prescriptions on File Prior to Visit  Medication Sig Dispense Refill  . acetaminophen (TYLENOL) 500 MG tablet Take 500 mg by mouth 2 (two) times daily as needed for moderate pain.    . naproxen sodium (ANAPROX) 220 MG tablet Take 440 mg by mouth daily as needed (pain).    . traMADol (ULTRAM) 50 MG tablet Take 1 tablet (50 mg total) by  mouth every 6 (six) hours as needed. 15 tablet 0   No current facility-administered medications on file prior to visit.   Allergies  Allergen Reactions  . Hydrocodone Nausea And Vomiting    Social History:  reports that she has never smoked. She has never used smokeless tobacco. She reports that she does not drink alcohol or use illicit drugs.  Family History  Problem Relation Age of Onset  . Diabetes Mother   . Cancer Father   . Seizures Sister     Review of Systems: Noncontributory  PHYSICAL EXAM: Blood pressure 128/76, height 5' 4" (1.626 m), weight 284 lb (128.822 kg). General appearance - alert, well appearing, and in no distress Chest - clear to auscultation, no wheezes, rales or rhonchi, symmetric air entry Heart - normal rate and regular rhythm Abdomen - soft, nontender, nondistended, no masses or organomegaly Pelvic - examination not indicated. Good support normal appearing cervix. Extremities - peripheral pulses normal, no pedal edema, no clubbing or cyanosis  Labs: Results for orders placed or performed during the hospital encounter of 09/11/15 (from the past 336 hour(s))  GC/Chlamydia probe amp ([place])not at [facility]   Collection Time: 09/11/15 12:00 AM  Result Value Ref Range   Chlamydia Negative    Neisseria gonorrhea Negative   CBC with Differential     Collection Time: 09/11/15 12:52 PM  Result Value Ref Range   WBC 6.9 4.0 - 10.5 K/uL   RBC 4.87 3.87 - 5.11 MIL/uL   Hemoglobin 12.1 12.0 - 15.0 g/dL   HCT 37.9 36.0 - 46.0 %   MCV 77.8 (L) 78.0 - 100.0 fL   MCH 24.8 (L) 26.0 - 34.0 pg   MCHC 31.9 30.0 - 36.0 g/dL   RDW 13.4 11.5 - 15.5 %   Platelets 172 150 - 400 K/uL   Neutrophils Relative % 54 %   Neutro Abs 3.8 1.7 - 7.7 K/uL   Lymphocytes Relative 34 %   Lymphs Abs 2.4 0.7 - 4.0 K/uL   Monocytes Relative 8 %   Monocytes Absolute 0.5 0.1  - 1.0 K/uL   Eosinophils Relative 4 %   Eosinophils Absolute 0.2 0.0 - 0.7 K/uL   Basophils Relative 0 %   Basophils Absolute 0.0 0.0 - 0.1 K/uL  Basic metabolic panel   Collection Time: 09/11/15 12:52 PM  Result Value Ref Range   Sodium 142 135 - 145 mmol/L   Potassium 4.0 3.5 - 5.1 mmol/L   Chloride 108 101 - 111 mmol/L   CO2 28 22 - 32 mmol/L   Glucose, Bld 95 65 - 99 mg/dL   BUN 8 6 - 20 mg/dL   Creatinine, Ser 0.55 0.44 - 1.00 mg/dL   Calcium 9.0 8.9 - 10.3 mg/dL   GFR calc non Af Amer >60 >60 mL/min   GFR calc Af Amer >60 >60 mL/min   Anion gap 6 5 - 15  Lipase, blood   Collection Time: 09/11/15 12:52 PM  Result Value Ref Range   Lipase 27 11 - 51 U/L  Hepatic function panel   Collection Time: 09/11/15 12:52 PM  Result Value Ref Range   Total Protein 6.9 6.5 - 8.1 g/dL   Albumin 4.0 3.5 - 5.0 g/dL   AST 24 15 - 41 U/L   ALT 30 14 - 54 U/L   Alkaline Phosphatase 50 38 - 126 U/L   Total Bilirubin 0.5 0.3 - 1.2 mg/dL   Bilirubin, Direct <0.1 (L) 0.1 - 0.5 mg/dL   Indirect Bilirubin NOT CALCULATED 0.3 - 0.9 mg/dL  Wet prep, genital   Collection Time: 09/11/15 3:47 PM  Result Value Ref Range   Yeast Wet Prep HPF POC NONE SEEN NONE SEEN   Trich, Wet Prep NONE SEEN NONE SEEN   Clue Cells Wet Prep HPF POC FEW (A) NONE SEEN   WBC, Wet Prep HPF POC RARE (A) NONE SEEN  Urinalysis, Routine w reflex microscopic   Collection Time: 09/11/15 4:20 PM  Result Value Ref Range   Color, Urine YELLOW YELLOW   APPearance CLEAR CLEAR   Specific Gravity, Urine 1.010 1.005 - 1.030   pH 5.5 5.0 - 8.0   Glucose, UA NEGATIVE NEGATIVE mg/dL   Hgb urine dipstick MODERATE (A) NEGATIVE   Bilirubin Urine NEGATIVE NEGATIVE   Ketones, ur NEGATIVE NEGATIVE mg/dL   Protein, ur NEGATIVE NEGATIVE mg/dL   Urobilinogen,  UA 0.2 0.0 - 1.0 mg/dL   Nitrite NEGATIVE NEGATIVE   Leukocytes, UA NEGATIVE NEGATIVE  Urine microscopic-add on   Collection Time: 09/11/15 4:20 PM  Result Value Ref Range   Squamous Epithelial / LPF RARE RARE   RBC / HPF 3-6 <3 RBC/hpf    Imaging Studies:  Imaging Results    Us Transvaginal Non-ob  09/11/2015 CLINICAL DATA: Right pelvic pain for 2 days. History of ovarian cysts   and possible left oophorectomy. Perimenopausal patient with no menses for 6 months. Evaluate for torsion. EXAM: TRANSABDOMINAL AND TRANSVAGINAL ULTRASOUND OF PELVIS DOPPLER ULTRASOUND OF OVARIES TECHNIQUE: Both transabdominal and transvaginal ultrasound examinations of the pelvis were performed. Transabdominal technique was performed for global imaging of the pelvis including uterus, ovaries, adnexal regions, and pelvic cul-de-sac. It was necessary to proceed with endovaginal exam following the transabdominal exam to visualize the endometrium and ovaries to better advantage. Color and duplex Doppler ultrasound was utilized to evaluate blood flow to the ovaries. COMPARISON: Abdominal pelvic CT 08/17/2015. Images only from outside pelvic ultrasound 09/01/2015-no report. FINDINGS: Study is mildly limited by body habitus. Uterus Measurements: 9.1 x 3.6 x 4.2 cm. Mildly heterogeneous without focal fibroid. Cervical nabothian cysts noted. Endometrium Thickness: 6 mm. No focal abnormality visualized. Right ovary Measurements: 5.2 x 3.7 x 6.0 cm. Several cystic lesions are again noted within the right ovary, similar to the recent prior studies. The largest measures 3.5 x 3.1 x 3.2 cm. No solid mass demonstrated. There is blood flow peripherally within the right ovary on color Doppler. Left ovary Measurements: Not visualized. Pulsed Doppler evaluation of the right ovary demonstrates normal low-resistance arterial and venous waveforms. Left ovary not visualized. Other findings No free fluid. IMPRESSION: 1.  Enlarged right ovary with several cysts, similar to recent outside study. 2. No evidence of ovarian torsion. 3. Left ovary not visualized. Electronically Signed By: William Veazey M.D. On: 09/11/2015 15:58   Us Transvaginal Non-ob  09/02/2015 GYNECOLOGIC SONOGRAM Erica Greer is a 48 y.o. perimenopausal,no periods for 6 mons.,she is here for a pelvic sonogram for RLQ pain. Uterus 8.8 x 3.6 x 3.6 cm, anteverted heterogenous uterus Endometrium  4 mm, symmetrical, wnl Right ovary 5.9 x 4.4 x 3.7 cm, rt ov contains two simple ovarian cysts (#1) 3.5 x 3.2 x 3.2,(#2) 2.3 x 2.1 x 2 cm Left ovary Surgically absent Technician Comments: PELVIC US TA/TV: heterogenous anteverted uterus,lt oophorectomy,no free fluid seen,appears to be mobile,rt adnexal pain during ultrasound,Dr Marlea Gambill spoke w/ pt after ultrasound regarding medication for pain. Amber J Carl 09/01/2015 2:50 PM Clinical Impression and recommendations: I have reviewed the sonogram results above. FSH has returned at 27, postmenopausal. Combined with the patient's current clinical course, below are my impressions and any appropriate recommendations for management based on the sonographic findings: 1 . Anteverted uterus, with thin endometrium, and normal left ovary 2. Several loculations in the area of the right ovary, and tube, with no internal blood flow on doppler studies suggesting benign nature. 3. Will add Ca-125 to evaluation, and short interval follow up recommended , consideration of excision recommended. Raffael Bugarin V  Us Pelvis Complete  09/11/2015 CLINICAL DATA: Right pelvic pain for 2 days. History of ovarian cysts and possible left oophorectomy. Perimenopausal patient with no menses for 6 months. Evaluate for torsion. EXAM: TRANSABDOMINAL AND TRANSVAGINAL ULTRASOUND OF PELVIS DOPPLER ULTRASOUND OF OVARIES TECHNIQUE: Both transabdominal and transvaginal ultrasound examinations of  the pelvis were performed. Transabdominal technique was performed for global imaging of the pelvis including uterus, ovaries, adnexal regions, and pelvic cul-de-sac. It was necessary to proceed with endovaginal exam following the transabdominal exam to visualize the endometrium and ovaries to better advantage. Color and duplex Doppler ultrasound was utilized to evaluate blood flow to the ovaries. COMPARISON: Abdominal pelvic CT 08/17/2015. Images only from outside pelvic ultrasound 09/01/2015-no report. FINDINGS: Study is mildly limited by body habitus. Uterus Measurements: 9.1 x 3.6 x 4.2 cm. Mildly heterogeneous without focal fibroid. Cervical nabothian cysts   noted. Endometrium Thickness: 6 mm. No focal abnormality visualized. Right ovary Measurements: 5.2 x 3.7 x 6.0 cm. Several cystic lesions are again noted within the right ovary, similar to the recent prior studies. The largest measures 3.5 x 3.1 x 3.2 cm. No solid mass demonstrated. There is blood flow peripherally within the right ovary on color Doppler. Left ovary Measurements: Not visualized. Pulsed Doppler evaluation of the right ovary demonstrates normal low-resistance arterial and venous waveforms. Left ovary not visualized. Other findings No free fluid. IMPRESSION: 1. Enlarged right ovary with several cysts, similar to recent outside study. 2. No evidence of ovarian torsion. 3. Left ovary not visualized. Electronically Signed By: William Veazey M.D. On: 09/11/2015 15:58   Us Pelvis Complete  09/02/2015 GYNECOLOGIC SONOGRAM Erica Greer is a 48 y.o. perimenopausal,no periods for 6 mons.,she is here for a pelvic sonogram for RLQ pain. Uterus 8.8 x 3.6 x 3.6 cm, anteverted heterogenous uterus Endometrium  4 mm, symmetrical, wnl Right ovary 5.9 x 4.4 x 3.7 cm, rt ov contains two simple ovarian cysts (#1) 3.5 x 3.2 x 3.2,(#2) 2.3 x 2.1 x 2 cm Left ovary Surgically absent Technician  Comments: PELVIC US TA/TV: heterogenous anteverted uterus,lt oophorectomy,no free fluid seen,appears to be mobile,rt adnexal pain during ultrasound,Dr Frankye Schwegel spoke w/ pt after ultrasound regarding medication for pain. Amber J Carl 09/01/2015 2:50 PM Clinical Impression and recommendations: I have reviewed the sonogram results above. FSH has returned at 27, postmenopausal. Combined with the patient's current clinical course, below are my impressions and any appropriate recommendations for management based on the sonographic findings: 1 . Anteverted uterus, with thin endometrium, and normal left ovary 2. Several loculations in the area of the right ovary, and tube, with no internal blood flow on doppler studies suggesting benign nature. 3. Will add Ca-125 to evaluation, and short interval follow up recommended , consideration of excision recommended. Achol Azpeitia V  Us Art/ven Flow Abd Pelv Doppler  09/11/2015 CLINICAL DATA: Right pelvic pain for 2 days. History of ovarian cysts and possible left oophorectomy. Perimenopausal patient with no menses for 6 months. Evaluate for torsion. EXAM: TRANSABDOMINAL AND TRANSVAGINAL ULTRASOUND OF PELVIS DOPPLER ULTRASOUND OF OVARIES TECHNIQUE: Both transabdominal and transvaginal ultrasound examinations of the pelvis were performed. Transabdominal technique was performed for global imaging of the pelvis including uterus, ovaries, adnexal regions, and pelvic cul-de-sac. It was necessary to proceed with endovaginal exam following the transabdominal exam to visualize the endometrium and ovaries to better advantage. Color and duplex Doppler ultrasound was utilized to evaluate blood flow to the ovaries. COMPARISON: Abdominal pelvic CT 08/17/2015. Images only from outside pelvic ultrasound 09/01/2015-no report. FINDINGS: Study is mildly limited by body habitus. Uterus Measurements: 9.1 x 3.6 x 4.2 cm. Mildly heterogeneous without focal fibroid. Cervical nabothian cysts noted.  Endometrium Thickness: 6 mm. No focal abnormality visualized. Right ovary Measurements: 5.2 x 3.7 x 6.0 cm. Several cystic lesions are again noted within the right ovary, similar to the recent prior studies. The largest measures 3.5 x 3.1 x 3.2 cm. No solid mass demonstrated. There is blood flow peripherally within the right ovary on color Doppler. Left ovary Measurements: Not visualized. Pulsed Doppler evaluation of the right ovary demonstrates normal low-resistance arterial and venous waveforms. Left ovary not visualized. Other findings No free fluid. IMPRESSION: 1. Enlarged right ovary with several cysts, similar to recent outside study. 2. No evidence of ovarian torsion. 3. Left ovary not visualized. Electronically Signed By: William Veazey M.D. On: 09/11/2015 15:58   Ct Renal Stone   Study  08/17/2015 CLINICAL DATA: Right flank pain. EXAM: CT ABDOMEN AND PELVIS WITHOUT CONTRAST TECHNIQUE: Multidetector CT imaging of the abdomen and pelvis was performed following the standard protocol without IV contrast. COMPARISON: None. FINDINGS: Lower chest: Clear lung bases. Normal heart size. Hepatobiliary: Diffuse low attenuation of the liver as can be seen with hepatic steatosis. No focal hepatic mass. Normal gallbladder. Pancreas: Code normal. Spleen: Normal. Adrenals/Urinary Tract: Normal adrenal glands. Normal kidneys. No urolithiasis or obstructive uropathy. Normal bladder. Stomach/Bowel: No bowel wall thickening or dilatation. No pneumatosis, pneumoperitoneum or portal venous gas. No abdominal or pelvic free fluid. Normal appendix. Vascular/Lymphatic: Normal caliber abdominal aorta. No abdominal or pelvic lymphadenopathy. Reproductive: Normal uterus. 3.5 cm intermediate density right ovarian mass which may represent a hemorrhagic cyst or endometrioma. There is small amount of loculated fluid along the medial aspect of the right ovarian mass which may reflect a para ovarian cyst or hydrosalpinx. Other: No  focal fluid collection or hematoma. Musculoskeletal: No acute osseous abnormality. Degenerative changes of bilateral sacroiliac joints. No aggressive lytic or sclerotic osseous lesion. Bilateral facet arthropathy at L3-4. IMPRESSION: 1. No urolithiasis or obstructive uropathy. 2. 3.5 cm intermediate density right ovarian mass which may represent a hemorrhagic cyst or endometrioma. Electronically Signed By: Hetal Patel On: 08/17/2015 13:15     Assessment: There are no active problems to display for this patient.   Plan: Patient will undergo surgical management with Laparoscopic right salpingo oophorectomy and left salpingectomy.           

## 2015-10-04 NOTE — Anesthesia Postprocedure Evaluation (Signed)
Anesthesia Post Note  Patient: Erica Greer  Procedure(s) Performed: Procedure(s) (LRB): LAPAROSCOPIC RIGHT SALPINGO OOPHORECTOMY (Right) LAPAROSCOPIC LEFT SALPINGECTOMY (Left)  Patient location during evaluation: PACU Anesthesia Type: General Level of consciousness: oriented and awake and alert Pain management: pain level controlled Vital Signs Assessment: post-procedure vital signs reviewed and stable Respiratory status: spontaneous breathing Cardiovascular status: blood pressure returned to baseline and stable Anesthetic complications: no    Last Vitals:  Filed Vitals:   10/04/15 1330 10/04/15 1345  BP: 110/44 112/59  Pulse:  62  Temp: 36.6 C   Resp: 16 12    Last Pain: There were no vitals filed for this visit.               ADAMS, AMY A

## 2015-10-05 ENCOUNTER — Encounter (HOSPITAL_COMMUNITY): Payer: Self-pay | Admitting: Obstetrics and Gynecology

## 2015-10-13 ENCOUNTER — Ambulatory Visit: Payer: 59 | Admitting: Obstetrics and Gynecology

## 2015-10-14 ENCOUNTER — Ambulatory Visit (INDEPENDENT_AMBULATORY_CARE_PROVIDER_SITE_OTHER): Payer: 59 | Admitting: Obstetrics and Gynecology

## 2015-10-14 ENCOUNTER — Encounter: Payer: Self-pay | Admitting: Obstetrics and Gynecology

## 2015-10-14 VITALS — BP 120/82 | Ht 64.0 in | Wt 287.0 lb

## 2015-10-14 DIAGNOSIS — Z9889 Other specified postprocedural states: Secondary | ICD-10-CM

## 2015-10-14 MED ORDER — ESTRADIOL 0.1 MG/GM VA CREA: 1.0000 g | TOPICAL_CREAM | Freq: Every day | VAGINAL | Status: DC

## 2015-10-14 NOTE — Progress Notes (Signed)
Patient ID: Erica Greer, female   DOB: [birth year], 49 y.o.   MRN: [id number] Subjective:  Erica Greer is a 49 y.o. female now 10 days status post LAPAROSCOPIC RIGHT SALPINGO OOPHORECTOMY (Right); LAPAROSCOPIC LEFT SALPINGECTOMY (Left)  Review of Systems Negative except no complaints at this time   Diet:   No change   Bowel movements : normal.  The patient is not having any pain.  Objective:  BP 120/82 mmHg  Ht 5\' 4"  (1.626 m)  Wt 287 lb (130.182 kg)  BMI 49.24 kg/m2 General:Well developed, well nourished.  No acute distress. Abdomen: Bowel sounds normal, soft, non-tender. Pelvic Exam: NA   Incision(s):   Healing well, no drainage, no erythema, no hernia, no swelling, no dehiscence,  Assessment:  Post-Op 10 days s/p LAPAROSCOPIC RIGHT SALPINGO OOPHORECTOMY (Right); LAPAROSCOPIC LEFT SALPINGECTOMY (Left)  Doing well postoperatively. Discussed with pt benefits of weight loss. Pt had opportunity to ask questions and has no further questions at this time.  Greater than 50% was spent in counseling and coordination of care with the patient. Discussion time: 15 minutes.   Plan:  1.Wound care discussed   2. . current medications: No changes.  3. Activity restrictions: none 4. return to work: now. 5. Follow up annually or PRN  6. Premarin vaginal cream 7. Diet and activity changes for successful weight loss.   By signing my name below, I, Terressa Koyanagi, attest that this documentation has been prepared under the direction and in the presence of Mallory Shirk, MD. Electronically Signed: Terressa Koyanagi, ED Scribe. 10/14/2015. 9:16 AM.   I personally performed the services described in this documentation, which was SCRIBED in my presence. The recorded information has been reviewed and considered accurate. It has been edited as necessary during review. Jonnie Kind, MD

## 2015-10-14 NOTE — Progress Notes (Signed)
Patient ID: Erica Greer, female   DOB: 05-19-1966, 49 y.o.   MRN: [id number] Pt here today for post op visit. Pt denies any problems or concerns at this time.

## 2016-01-22 ENCOUNTER — Emergency Department (HOSPITAL_COMMUNITY)
Admission: EM | Admit: 2016-01-22 | Discharge: 2016-01-22 | Disposition: A | Discharge status: Home / Self Care | Payer: BLUE CROSS/BLUE SHIELD | Attending: Emergency Medicine | Admitting: Emergency Medicine

## 2016-01-22 ENCOUNTER — Encounter (HOSPITAL_COMMUNITY): Payer: Self-pay | Admitting: Emergency Medicine

## 2016-01-22 DIAGNOSIS — K029 Dental caries, unspecified: Secondary | ICD-10-CM

## 2016-01-22 DIAGNOSIS — K0889 Other specified disorders of teeth and supporting structures: Secondary | ICD-10-CM | POA: Diagnosis present

## 2016-01-22 DIAGNOSIS — K047 Periapical abscess without sinus: Secondary | ICD-10-CM

## 2016-01-22 MED ORDER — PROMETHAZINE HCL 12.5 MG PO TABS: 12.5000 mg | ORAL_TABLET | Freq: Four times a day (QID) | ORAL | Status: DC | PRN

## 2016-01-22 MED ORDER — HYDROCODONE-ACETAMINOPHEN 7.5-325 MG/15ML PO SOLN: 10.0000 mL | ORAL | Status: DC | PRN

## 2016-01-22 NOTE — Discharge Instructions (Signed)

## 2016-01-22 NOTE — ED Notes (Signed)
Patient c/o right lower dental pain. Per patient went to have tooth removed on Friday. Per patient unable to due to infection, patient given amoxicillin. Per patient pain has now become unbearable despite using tylenol and aleve.

## 2016-01-22 NOTE — ED Provider Notes (Signed)
CSN: [id number]     Arrival date & time 01/22/16  1422 History  By signing my name below, I, Erica Greer, attest that this documentation has been prepared under the direction and in the presence of Erica Kocher, PA-C. Electronically Signed: Stephania Greer, ED Scribe. 01/22/2016. 3:39 PM.    Chief Complaint  Patient presents with  . Dental Pain   Patient is a 50 y.o. female presenting with tooth pain. The history is provided by the patient. No language interpreter was used.  Dental Pain Location:  Lower Lower teeth location: right. Quality:  Aching Severity:  Severe Onset quality:  Gradual Timing:  Constant Progression:  Worsening Chronicity:  Chronic Context: dental caries   Previous work-up:  Dental exam Relieved by:  Nothing Worsened by:  Nothing tried Ineffective treatments: amoxicillin. Associated symptoms: no drooling and no fever    HPI Comments: Erica Greer is a 50 y.o. female who presents to the Emergency Department complaining of gradual-onset, constant, gradually worsening right lower dental pain that has been present for "a while" but which acutely worsened 1 week ago. Patient states she had seen her PCP 2 days ago and was scheduled to have an extraction, but she had her extraction postponed for 3 days from now due to an infection. She states she was given amoxicillin, which she has been taking with no noticeable relief. Patient states she has been able to tolerate her secretions. She denies any fever or drooling. Patient states she has a listed allergy to hydrocodone only because "the pills are so large they are hard to swallow, not because I have itching or anything."  Past Medical History  Diagnosis Date  . Ovarian cyst     right    Past Surgical History  Procedure Laterality Date  . Ovarian cyst removal Left   . Tubal ligation Left     Unilateral  . Laparoscopic salpingo oopherectomy Right 10/04/2015    Procedure: LAPAROSCOPIC RIGHT SALPINGO OOPHORECTOMY;   Surgeon: Jonnie Kind, MD;  Location: [facility];  Service: Gynecology;  Laterality: Right;  . Laparoscopic unilateral salpingectomy Left 10/04/2015    Procedure: LAPAROSCOPIC LEFT SALPINGECTOMY;  Surgeon: Jonnie Kind, MD;  Location: [facility];  Service: Gynecology;  Laterality: Left;   Family History  Problem Relation Age of Onset  . Diabetes Mother   . Cancer Father   . Seizures Sister    Social History  Substance Use Topics  . Smoking status: Never Smoker   . Smokeless tobacco: Never Used  . Alcohol Use: No   OB History    Gravida Para Term Preterm AB TAB SAB Ectopic Multiple Living   5 5 5       5      Review of Systems  Constitutional: Negative for fever.  HENT: Positive for dental problem. Negative for drooling and trouble swallowing.   All other systems reviewed and are negative.  Allergies  Hydrocodone  Home Medications   Prior to Admission medications   Medication Sig Start Date End Date Taking? Authorizing Provider  acetaminophen (TYLENOL) 500 MG tablet Take 500 mg by mouth 2 (two) times daily as needed for moderate pain.    Historical Provider, MD  estradiol (ESTRACE VAGINAL) 0.1 MG/GM vaginal cream Place [id number] Applicatorfuls vaginally at bedtime. Use nightly x 2wk, then 3 times weekly, insert to the vagina before bedtime. 10/14/15   Jonnie Kind, MD  naproxen sodium (ANAPROX) 220 MG tablet Take 440 mg by mouth daily as needed (pain).  Historical Provider, MD  traMADol (ULTRAM) 50 MG tablet Take 1 tablet (50 mg total) by mouth every 6 (six) hours as needed. 09/16/15   Jonnie Kind, MD   BP 140/75 mmHg  Pulse 63  Temp(Src) 98.3 F (36.8 C)  Resp 18  SpO2 99% Physical Exam  Constitutional: She is oriented to person, place, and time. She appears well-developed and well-nourished. No distress.  HENT:  Head: Normocephalic and atraumatic.  Deep cavity to right lower molar that is tender to percussion. Swelling of the gum. No visible abscess. Deep cavity of the  left lower molar. Not tender. No swelling. No swelling under the tongue. Airway patent.   Eyes: Conjunctivae and EOM are normal.  Neck: Neck supple. No tracheal deviation present.  Cardiovascular: Normal rate.   Pulmonary/Chest: Effort normal. No respiratory distress.  Abdominal: Bowel sounds are normal.  Musculoskeletal: Normal range of motion.  Lymphadenopathy:  Few palpable submental nodes.  Neurological: She is alert and oriented to person, place, and time.  Skin: Skin is warm and dry.  Psychiatric: She has a normal mood and affect. Her behavior is normal.  Nursing note and vitals reviewed.   ED Course  Procedures (including critical care time)  DIAGNOSTIC STUDIES: Oxygen Saturation is 99% on RA, normal by my interpretation.    COORDINATION OF CARE: 3:42 PM - Discussed treatment plan with pt at bedside which includes hydrocodone in liquid suspension for pain relief until patient's scheduled extraction. Pt verbalized understanding and agreed to plan.   MDM  Patient has infected dental caries present. No visible abscess is seen. There is no trismus noted. There is no evidence for Ludwig's angina or other emergent conditions. Vital signs are stable. The patient is currently receiving antibiotic therapy. Prescription for hydrocodone liquid, and promethazine given to the patient. Questions were answered. Feel that it is safe for the patient be discharged home at this time.    Final diagnoses:  Infected dental caries    **I personally performed the services described in this documentation, which was scribed in my presence. The recorded information has been reviewed and is accurate.* I have reviewed nursing notes, vital signs, and all appropriate lab and imaging results for this patient.   Erica Kocher, PA-C 01/23/16 2024  Merrily Pew, MD 01/25/16 [phone]

## 2016-02-23 ENCOUNTER — Encounter (HOSPITAL_COMMUNITY): Payer: Self-pay | Admitting: *Deleted

## 2016-02-23 ENCOUNTER — Emergency Department (HOSPITAL_COMMUNITY): Payer: BLUE CROSS/BLUE SHIELD

## 2016-02-23 ENCOUNTER — Emergency Department (HOSPITAL_COMMUNITY)
Admission: EM | Admit: 2016-02-23 | Discharge: 2016-02-23 | Disposition: A | Discharge status: Home / Self Care | Payer: BLUE CROSS/BLUE SHIELD | Attending: Emergency Medicine | Admitting: Emergency Medicine

## 2016-02-23 DIAGNOSIS — M1712 Unilateral primary osteoarthritis, left knee: Secondary | ICD-10-CM

## 2016-02-23 DIAGNOSIS — Z791 Long term (current) use of non-steroidal anti-inflammatories (NSAID): Secondary | ICD-10-CM | POA: Diagnosis not present

## 2016-02-23 DIAGNOSIS — M25562 Pain in left knee: Secondary | ICD-10-CM | POA: Diagnosis present

## 2016-02-23 MED ORDER — DICLOFENAC SODIUM 75 MG PO TBEC: 75.0000 mg | DELAYED_RELEASE_TABLET | Freq: Two times a day (BID) | ORAL | Status: DC

## 2016-02-23 MED ORDER — ONDANSETRON HCL 4 MG PO TABS
4.0000 mg | ORAL_TABLET | Freq: Once | ORAL | Status: AC
  Administered 2016-02-23: 4 mg via ORAL
  Filled 2016-02-23: qty 1

## 2016-02-23 MED ORDER — ONDANSETRON HCL 4 MG PO TABS: 4.0000 mg | ORAL_TABLET | Freq: Four times a day (QID) | ORAL | Status: DC

## 2016-02-23 MED ORDER — OXYCODONE-ACETAMINOPHEN 5-325 MG PO TABS: 1.0000 | ORAL_TABLET | Freq: Four times a day (QID) | ORAL | Status: DC | PRN

## 2016-02-23 MED ORDER — OXYCODONE-ACETAMINOPHEN 5-325 MG PO TABS
1.0000 | ORAL_TABLET | Freq: Once | ORAL | Status: AC
  Administered 2016-02-23: 1 via ORAL
  Filled 2016-02-23: qty 1

## 2016-02-23 MED ORDER — KETOROLAC TROMETHAMINE 10 MG PO TABS
10.0000 mg | ORAL_TABLET | Freq: Once | ORAL | Status: AC
  Administered 2016-02-23: 10 mg via ORAL
  Filled 2016-02-23: qty 1

## 2016-02-23 NOTE — ED Notes (Signed)
Pt verbalized understanding of no driving and to use caution within 4 hours of taking pain meds due to meds cause drowsiness.  Pt also made aware that percocet can cause constipation and pt may need stool softener or laxative

## 2016-02-23 NOTE — ED Provider Notes (Signed)
CSN: [id number]     Arrival date & time 02/23/16  1120 History   First MD Initiated Contact with Patient 02/23/16 1128     Chief Complaint  Patient presents with  . Knee Pain     (Consider location/radiation/quality/duration/timing/severity/associated sxs/prior Treatment) HPI Comments: Patient is a 50 year old female who presents to the emergency department with a complaint of left knee pain.  The patient states that this morning she was getting out of bed, she felt and heard a pop in her left knee joint, and his been having pain since that time. She particularly has pain when she attempts to put weight on it. It is of note that she works at a [employer] chain, and she states she is on her feet all day. She's not had any previous operations or procedures involving her knees. His been no recent falls or trauma to the knees. The patient has not taken anything today for her discomfort.  The history is provided by the patient.    Past Medical History  Diagnosis Date  . Ovarian cyst     right    Past Surgical History  Procedure Laterality Date  . Ovarian cyst removal Left   . Tubal ligation Left     Unilateral  . Laparoscopic salpingo oopherectomy Right 10/04/2015    Procedure: LAPAROSCOPIC RIGHT SALPINGO OOPHORECTOMY;  Surgeon: Jonnie Kind, MD;  Location: [facility];  Service: Gynecology;  Laterality: Right;  . Laparoscopic unilateral salpingectomy Left 10/04/2015    Procedure: LAPAROSCOPIC LEFT SALPINGECTOMY;  Surgeon: Jonnie Kind, MD;  Location: [facility];  Service: Gynecology;  Laterality: Left;   Family History  Problem Relation Age of Onset  . Diabetes Mother   . Cancer Father   . Seizures Sister    Social History  Substance Use Topics  . Smoking status: Never Smoker   . Smokeless tobacco: Never Used  . Alcohol Use: No   OB History    Gravida Para Term Preterm AB TAB SAB Ectopic Multiple Living   5 5 5       5      Review of Systems  Musculoskeletal: Positive  for arthralgias.  All other systems reviewed and are negative.     Allergies  Hydrocodone  Home Medications   Prior to Admission medications   Medication Sig Start Date End Date Taking? Authorizing Provider  acetaminophen (TYLENOL) 500 MG tablet Take 500 mg by mouth 2 (two) times daily as needed for moderate pain.    Historical Provider, MD  estradiol (ESTRACE VAGINAL) 0.1 MG/GM vaginal cream Place [id number] Applicatorfuls vaginally at bedtime. Use nightly x 2wk, then 3 times weekly, insert to the vagina before bedtime. 10/14/15   Jonnie Kind, MD  HYDROcodone-acetaminophen (HYCET) 7.5-325 mg/15 ml solution Take 10 mLs by mouth every 4 (four) hours as needed for moderate pain. 01/22/16 01/21/17  [name], PA-C  naproxen sodium (ANAPROX) 220 MG tablet Take 440 mg by mouth daily as needed (pain).    Historical Provider, MD  promethazine (PHENERGAN) 12.5 MG tablet Take 1 tablet (12.5 mg total) by mouth every 6 (six) hours as needed for nausea or vomiting. 01/22/16   [name], PA-C  traMADol (ULTRAM) 50 MG tablet Take 1 tablet (50 mg total) by mouth every 6 (six) hours as needed. 09/16/15   Jonnie Kind, MD   BP 120/51 mmHg  Pulse 77  Temp(Src) 97.7 F (36.5 C) (Oral)  Resp 20  Ht 5\' 4"  (1.626 m)  Wt 127.007  kg  BMI 48.04 kg/m2  SpO2 98%  LMP 02/11/2015 Physical Exam  Constitutional: She is oriented to person, place, and time. She appears well-developed and well-nourished.  Non-toxic appearance.  HENT:  Head: Normocephalic.  Right Ear: Tympanic membrane and external ear normal.  Left Ear: Tympanic membrane and external ear normal.  Eyes: EOM and lids are normal. Pupils are equal, round, and reactive to light.  Neck: Normal range of motion. Neck supple. Carotid bruit is not present.  Cardiovascular: Normal rate, regular rhythm, normal heart sounds, intact distal pulses and normal pulses.   Pulmonary/Chest: Breath sounds normal. No respiratory distress.  Abdominal: Soft. Bowel  sounds are normal. There is no tenderness. There is no guarding.  Musculoskeletal: Normal range of motion.  There is no deformity of the quadricep area, or the patella tendon area on the left. There is mild to moderate puffiness of the left knee. There is pain at the medial margin of the knee. The patella is in the midline. There is no noted effusion. There is no posterior mass appreciated. The Achilles tendon is intact. The dorsalis pedis pulses 2+.  Lymphadenopathy:       Head (right side): No submandibular adenopathy present.       Head (left side): No submandibular adenopathy present.    She has no cervical adenopathy.  Neurological: She is alert and oriented to person, place, and time. She has normal strength. No cranial nerve deficit or sensory deficit.  Skin: Skin is warm and dry.  Psychiatric: She has a normal mood and affect. Her speech is normal.  Nursing note and vitals reviewed.   ED Course  Procedures (including critical care time) Labs Review Labs Reviewed - No data to display  Imaging Review No results found. I have personally reviewed and evaluated these images and lab results as part of my medical decision-making.   EKG Interpretation None      MDM  Vital signs reviewed. The pulse oximetry is 98% on room air. X-ray of the knee reveals no acute osseous abnormality. There is noted several areas of compartment osteoarthritis and arthrosis.   Discussed the findings of the examination and the x-ray with the patient in terms which he understands. The patient was fitted with a knee immobilizer. She will see Dr. Aline Brochure for additional evaluation and management.    Final diagnoses:  Knee pain, left  Primary osteoarthritis of left knee    **I have reviewed nursing notes, vital signs, and all appropriate lab and imaging results for this patient.[name], PA-C 02/23/16 [place], MD 02/23/16 [phone]

## 2016-02-23 NOTE — ED Notes (Signed)
Pt believes her left knee popped out of joint while getting out of bed this morning

## 2016-02-23 NOTE — Discharge Instructions (Signed)
Your vital signs are within normal limits. The x-ray of your knee reveals several areas of arthritis. No fracture or dislocation is appreciated. Please use your knee immobilizer until seen by the orthopedic specialist. You do not need to sleep in this device. Please see the orthopedic specialist as sone as possible. Please keep your leg elevated when possible. Use diclofenac 2 times daily with food. May use Tylenol in between the diclofenac doses if needed for mild pain. Use oxycodone for more severe pain. This medication may cause drowsiness, and/or constipation. Please use a stool softener while taking this medication. Please do not drive, drink alcohol, operate machinery, handle important documents, or participate Knee Pain Knee pain is a common problem. It can have many causes. The pain often goes away by following your doctor's home care instructions. Treatment for ongoing pain will depend on the cause of your pain. If your knee pain continues, more tests may be needed to diagnose your condition. Tests may include X-rays or other imaging studies of your knee. HOME CARE  Take medicines only as told by your doctor.  Rest your knee and keep it raised (elevated) while you are resting.  Do not do things that cause pain or make your pain worse.  Avoid activities where both feet leave the ground at the same time, such as running, jumping rope, or doing jumping jacks.  Apply ice to the knee area:  Put ice in a plastic bag.  Place a towel between your skin and the bag.  Leave the ice on for 20 minutes, 2-3 times a day.  Ask your doctor if you should wear an elastic knee support.  Sleep with a pillow under your knee.  Lose weight if you are overweight. Being overweight can make your knee hurt more.  Do not use any tobacco products, including cigarettes, chewing tobacco, or electronic cigarettes. If you need help quitting, ask your doctor. Smoking may slow the healing of any bone and joint  problems that you may have. GET HELP IF:  Your knee pain does not stop, it changes, or it gets worse.  You have a fever along with knee pain.  Your knee gives out or locks up.  Your knee becomes more swollen. GET HELP RIGHT AWAY IF:   Your knee feels hot to the touch.  You have chest pain or trouble breathing.   This information is not intended to replace advice given to you by your health care provider. Make sure you discuss any questions you have with your health care provider.   Document Released: 01/25/2009 Document Revised: 11/19/2014 Document Reviewed: 12/30/2013 Elsevier Interactive Patient Education [employer].  in activities requiring concentration when taking this medication.

## 2016-03-01 ENCOUNTER — Ambulatory Visit (INDEPENDENT_AMBULATORY_CARE_PROVIDER_SITE_OTHER): Payer: BLUE CROSS/BLUE SHIELD | Admitting: Orthopaedic Surgery

## 2016-03-01 ENCOUNTER — Encounter: Payer: Self-pay | Admitting: Orthopaedic Surgery

## 2016-03-01 VITALS — BP 143/74 | HR 73 | Temp 97.5°F | Ht 64.0 in | Wt 280.0 lb

## 2016-03-01 DIAGNOSIS — M25562 Pain in left knee: Secondary | ICD-10-CM | POA: Diagnosis not present

## 2016-03-01 MED ORDER — DICLOFENAC SODIUM 75 MG PO TBEC: 75.0000 mg | DELAYED_RELEASE_TABLET | Freq: Two times a day (BID) | ORAL | Status: DC

## 2016-03-01 NOTE — Progress Notes (Addendum)
Subjective: my left knee hurts    Patient ID: Erica Greer, female    DOB: July 07, 1966, 50 y.o.   MRN: [id number]  Knee Pain  There was no injury mechanism. The pain is present in the left knee. The quality of the pain is described as aching. The pain is at a severity of 4/10. The pain is moderate. The pain has been worsening since onset. Associated symptoms include a loss of motion. Pertinent negatives include no loss of sensation, muscle weakness, numbness or tingling. The symptoms are aggravated by weight bearing. She has tried acetaminophen, ice, NSAIDs, rest and non-weight bearing for the symptoms. The treatment provided mild relief.   She has had increasing pain in the left knee over the last several months. She was seen in the ER on 02-23-16.  She had x-rays.  I have reviewed the ER notes, the x-rays and the x-ray report.  The report notes moderate medial compartment osteoarthritis.  I have shown her the x-rays as well and explained the findings.  She has swelling of the left knee, popping, but no giving way or locking.  She has no redness.   She is taking the diclofenac given in the ER but is very low.  I will refill it.   Review of Systems  HENT: Negative for congestion.   Respiratory: Negative for cough and shortness of breath.   Cardiovascular: Negative for chest pain and leg swelling.  Endocrine: Positive for cold intolerance.  Musculoskeletal: Positive for joint swelling, arthralgias and gait problem.  Allergic/Immunologic: Positive for environmental allergies.  Neurological: Negative for tingling and numbness.   Past Medical History  Diagnosis Date  . Ovarian cyst     right     Past Surgical History  Procedure Laterality Date  . Ovarian cyst removal Left   . Tubal ligation Left     Unilateral  . Laparoscopic salpingo oopherectomy Right 10/04/2015    Procedure: LAPAROSCOPIC RIGHT SALPINGO OOPHORECTOMY;  Surgeon: Jonnie Kind, MD;  Location: [facility];  Service:  Gynecology;  Laterality: Right;  . Laparoscopic unilateral salpingectomy Left 10/04/2015    Procedure: LAPAROSCOPIC LEFT SALPINGECTOMY;  Surgeon: Jonnie Kind, MD;  Location: [facility];  Service: Gynecology;  Laterality: Left;    Current Outpatient Prescriptions on File Prior to Visit  Medication Sig Dispense Refill  . diclofenac (VOLTAREN) 75 MG EC tablet Take 1 tablet (75 mg total) by mouth 2 (two) times daily. 12 tablet 0  . ondansetron (ZOFRAN) 4 MG tablet Take 1 tablet (4 mg total) by mouth every 6 (six) hours. Use for nausea/vomiting 10 tablet 0  . oxyCODONE-acetaminophen (PERCOCET/ROXICET) 5-325 MG tablet Take 1 tablet by mouth every 6 (six) hours as needed. 15 tablet 0  . promethazine (PHENERGAN) 12.5 MG tablet Take 1 tablet (12.5 mg total) by mouth every 6 (six) hours as needed for nausea or vomiting. 12 tablet 0  . acetaminophen (TYLENOL) 500 MG tablet Take 500 mg by mouth 2 (two) times daily as needed for moderate pain. Reported on 03/01/2016    . estradiol (ESTRACE VAGINAL) 0.1 MG/GM vaginal cream Place [id number] Applicatorfuls vaginally at bedtime. Use nightly x 2wk, then 3 times weekly, insert to the vagina before bedtime. (Patient not taking: Reported on 03/01/2016) 42.5 g 3  . HYDROcodone-acetaminophen (HYCET) 7.5-325 mg/15 ml solution Take 10 mLs by mouth every 4 (four) hours as needed for moderate pain. (Patient not taking: Reported on 03/01/2016) 120 mL 0  . naproxen sodium (ANAPROX) 220 MG tablet Take  440 mg by mouth daily as needed (pain). Reported on 03/01/2016    . traMADol (ULTRAM) 50 MG tablet Take 1 tablet (50 mg total) by mouth every 6 (six) hours as needed. (Patient not taking: Reported on 03/01/2016) 45 tablet 0   No current facility-administered medications on file prior to visit.    Social History   Social History  . Marital Status: Married    Spouse Name: N/A  . Number of Children: N/A  . Years of Education: N/A   Occupational History  . Not on file.   Social  History Main Topics  . Smoking status: Never Smoker   . Smokeless tobacco: Never Used  . Alcohol Use: No  . Drug Use: No  . Sexual Activity: Yes    Birth Control/ Protection: Surgical   Other Topics Concern  . Not on file   Social History Narrative    BP 143/74 mmHg  Pulse 73  Temp(Src) 97.5 F (36.4 C)  Ht 5\' 4"  (1.626 m)  Wt 280 lb (127.007 kg)  BMI 48.04 kg/m2  LMP 02/11/2015      Objective:   Physical Exam  Constitutional: She is oriented to person, place, and time. She appears well-developed and well-nourished.  Morbid obesity  HENT:  Head: Normocephalic and atraumatic.  Eyes: Conjunctivae and EOM are normal. Pupils are equal, round, and reactive to light.  Neck: Normal range of motion. Neck supple.  Cardiovascular: Normal rate, regular rhythm and intact distal pulses.   Pulmonary/Chest: Effort normal.  Abdominal: Soft.  Musculoskeletal: She exhibits tenderness (She has pain of the left knee with R)M 0 to 105 with crepitus and medial joint line pain.  Slight effusion is present.  Limp to the left.  Right knee is not tender but has crepitus.).       Legs: Neurological: She is alert and oriented to person, place, and time. She displays normal reflexes. No cranial nerve deficit. She exhibits normal muscle tone. Coordination normal.  Skin: Skin is warm and dry.  Psychiatric: She has a normal mood and affect. Her behavior is normal. Judgment and thought content normal.  Vitals reviewed.     Assessment & Plan:   Encounter Diagnoses  Name Primary?  . Left knee pain Yes  . Morbid obesity due to excess calories ([place])    PROCEDURE NOTE:  The patient requests injections of the left knee , verbal consent was obtained.  The left knee was prepped appropriately after time out was performed.   Sterile technique was observed and injection of 1 cc of Depo-Medrol 40 mg with several cc's of plain xylocaine. Anesthesia was provided by ethyl chloride and a 20-gauge needle was  used to inject the knee area. The injection was tolerated well.  A band aid dressing was applied.  The patient was advised to apply ice later today and tomorrow to the injection sight as needed.  I have told her she may need a MRI.  I have refilled her diclofenac.  Return in one month.  Call if any problems.  Precautions discussed.

## 2016-03-28 IMAGING — MG MM DIGITAL SCREENING
8 of 9 series · 8 of 9 positions shown · non-contrast
Comparison: Previous exam(s).

CLINICAL DATA: Screening.

EXAM:
DIGITAL SCREENING BILATERAL MAMMOGRAM WITH CAD

[L CC]
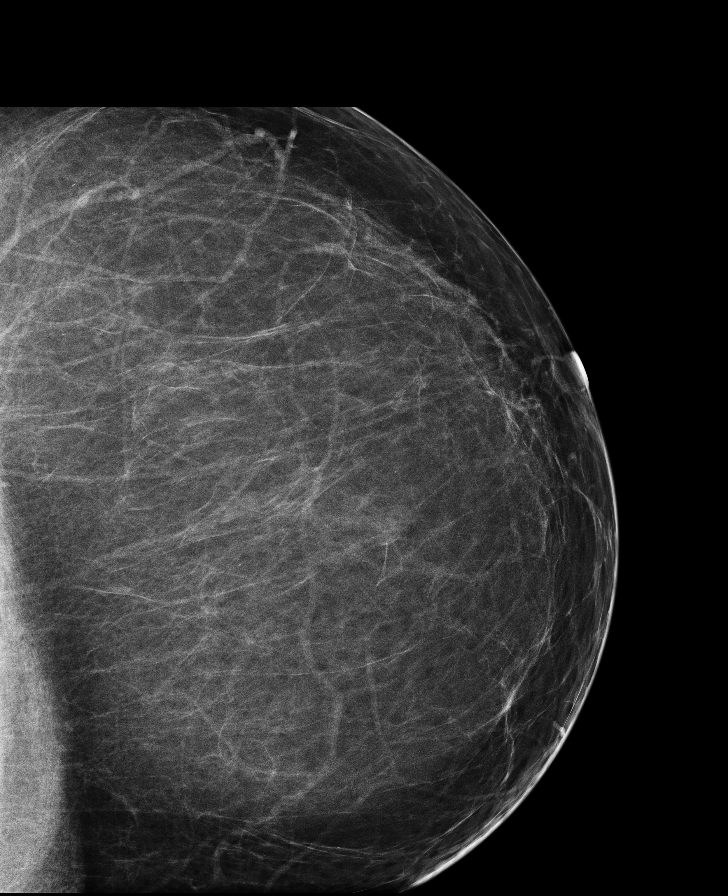

[L MLO (1 of 2)]
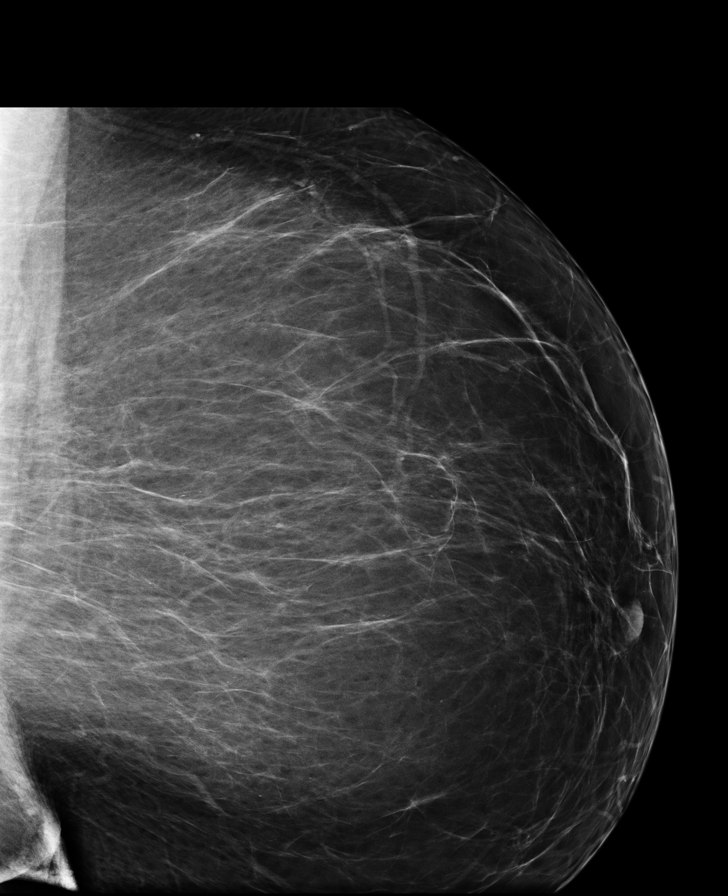

[R CC]
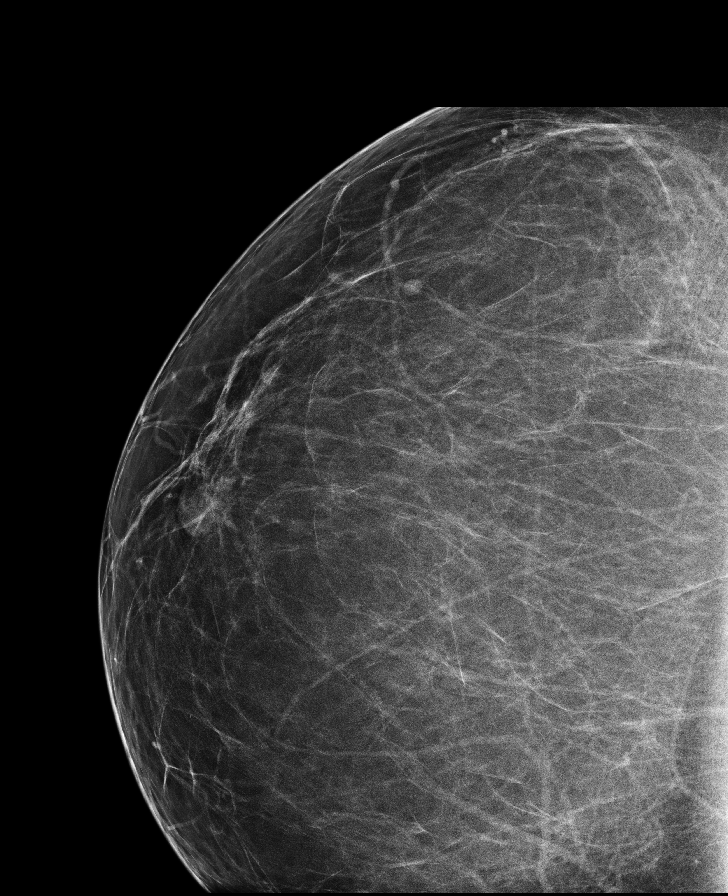

[R MLO (1 of 2)]
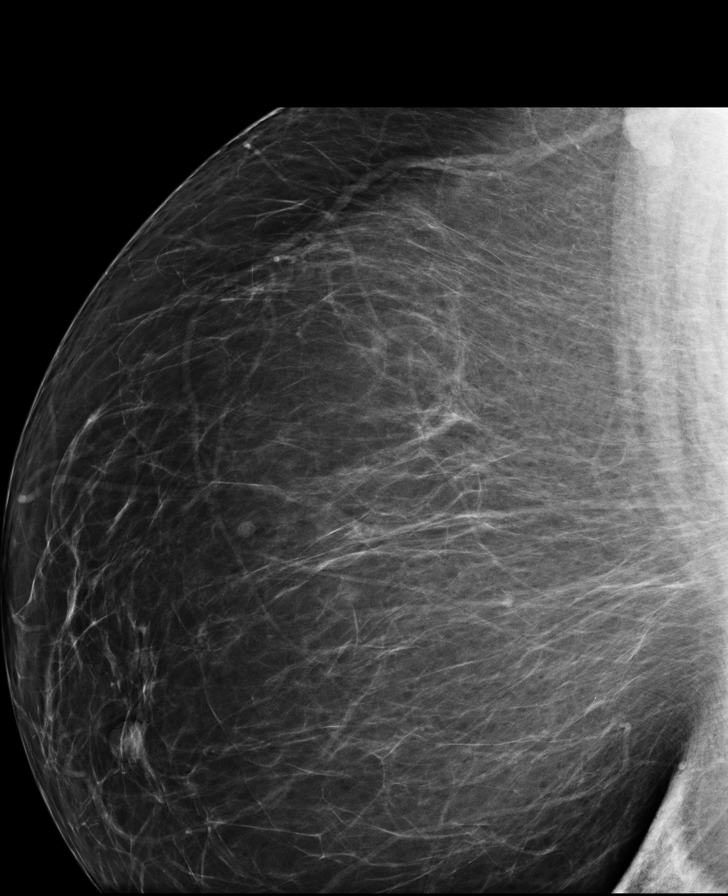

[L CV]
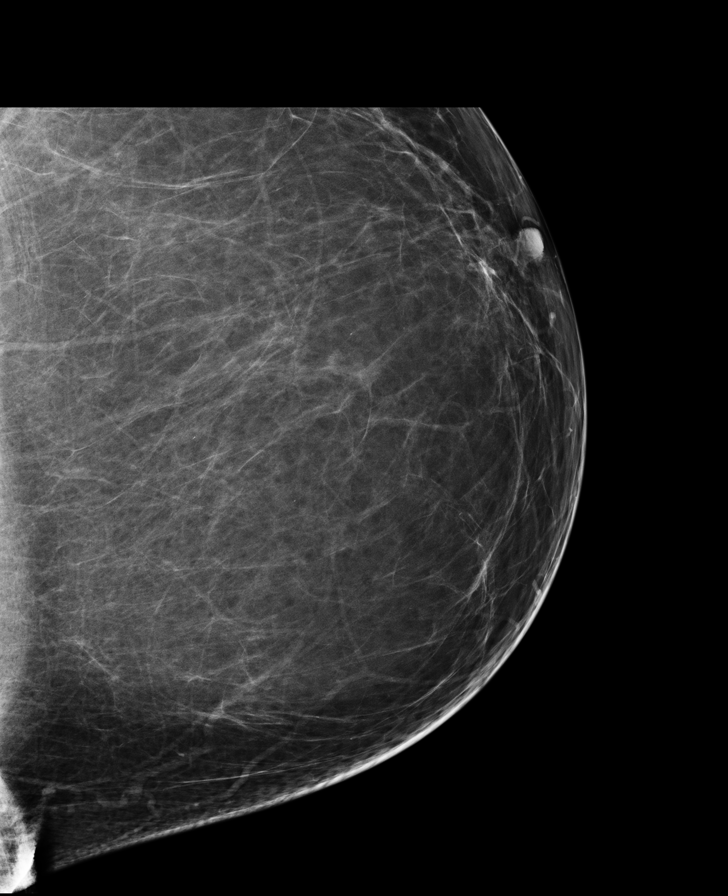

[R CV]
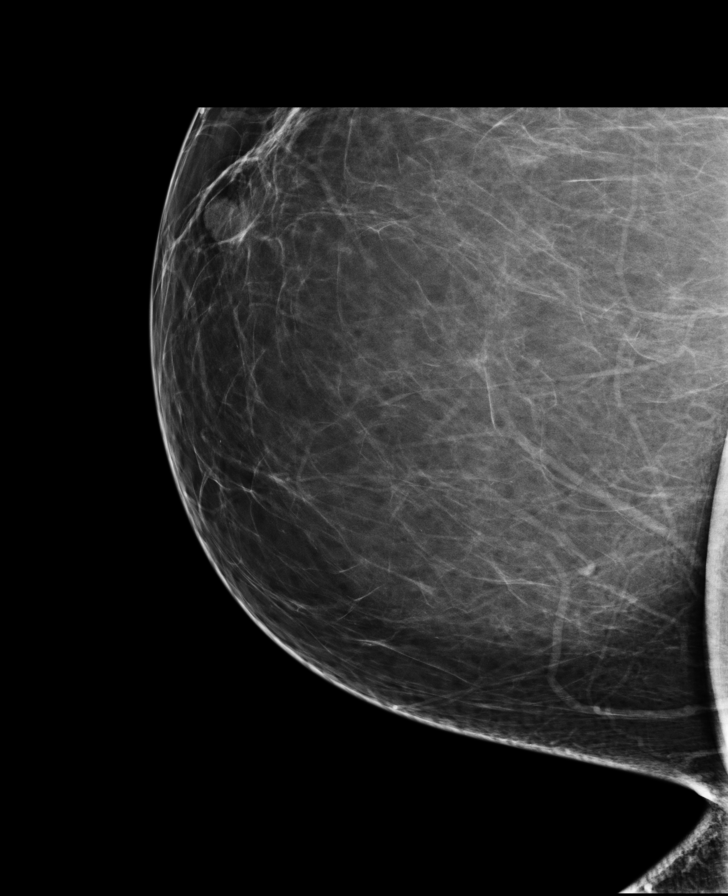

[L MLO (2 of 2)]
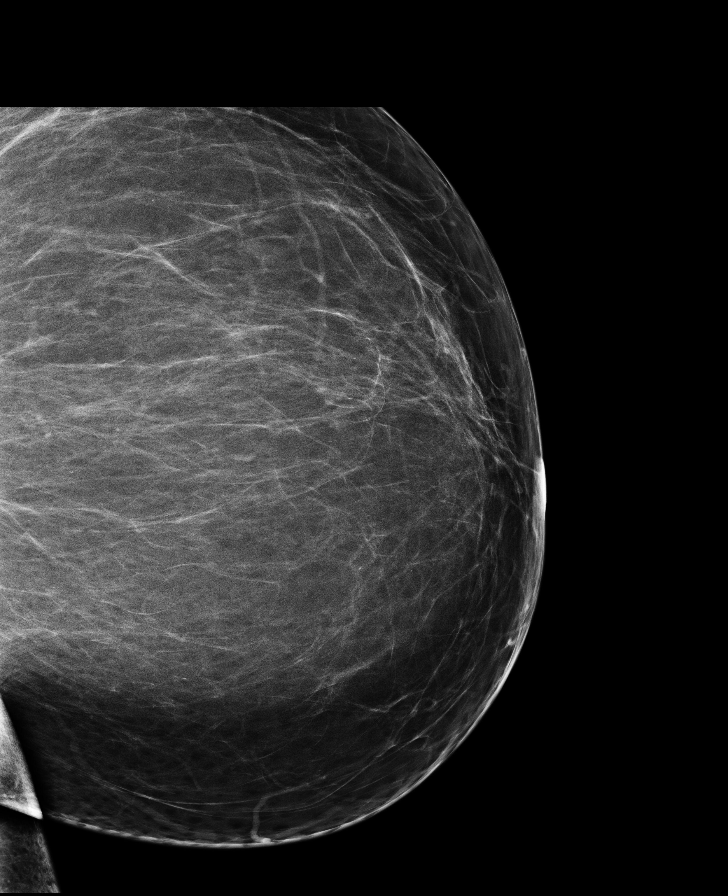

[R MLO (2 of 2)]
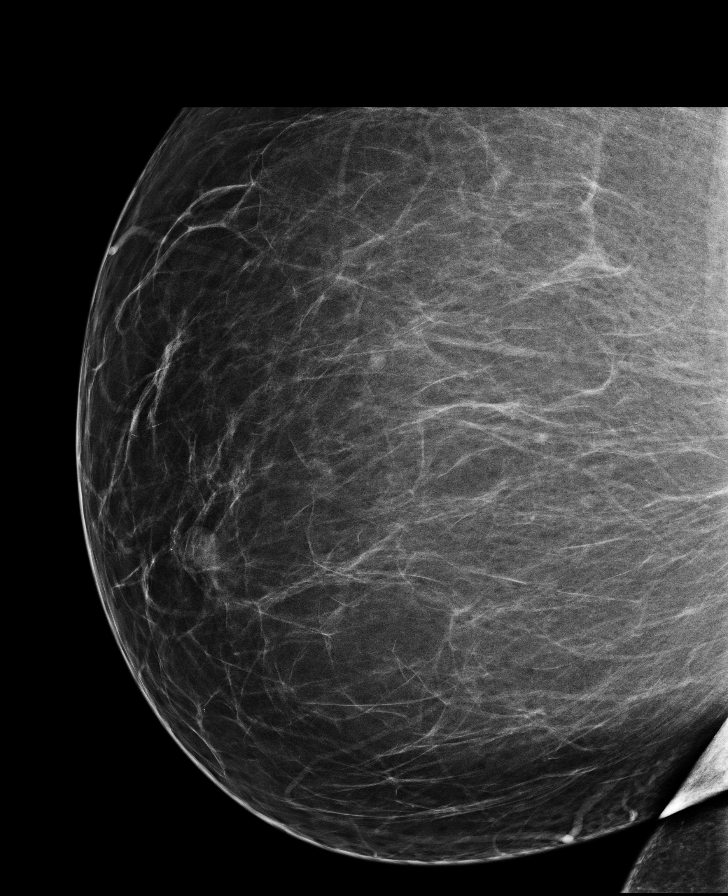

[8 of 9 positions shown; findings below may reference images not displayed]

ACR Breast Density Category b: There are scattered areas of
fibroglandular density.
FINDINGS: There are no findings suspicious for malignancy. Images were
processed with CAD.
IMPRESSION: No mammographic evidence of malignancy. A result letter of this
screening mammogram will be mailed directly to the patient.

RECOMMENDATION:
Screening mammogram in one year. (Code:AS-G-LCT)

BI-RADS CATEGORY  1: Negative.

## 2016-03-29 ENCOUNTER — Encounter: Payer: Self-pay | Admitting: Orthopaedic Surgery

## 2016-03-29 ENCOUNTER — Ambulatory Visit (INDEPENDENT_AMBULATORY_CARE_PROVIDER_SITE_OTHER): Payer: BLUE CROSS/BLUE SHIELD | Admitting: Orthopaedic Surgery

## 2016-03-29 VITALS — BP 144/80 | HR 66 | Temp 97.0°F | Ht 64.0 in | Wt 280.0 lb

## 2016-03-29 DIAGNOSIS — M25562 Pain in left knee: Secondary | ICD-10-CM | POA: Diagnosis not present

## 2016-03-29 MED ORDER — HYDROCODONE-ACETAMINOPHEN 5-325 MG PO TABS: 1.0000 | ORAL_TABLET | ORAL | Status: DC | PRN

## 2016-03-29 NOTE — Progress Notes (Signed)
CC:  I have pain of my left knee. I would like an injection.  The patient has chronic pain of the left knee.  There is no recent trauma.  There is no redness.  Injections in the past have helped.  The knee has no redness, has an effusion and crepitus present.  ROM of the knee is 0-110.  Impression:  Chronic knee pain left.  Return: one month  PROCEDURE NOTE:  The patient requests injections of the left knee , verbal consent was obtained.  The left knee was prepped appropriately after time out was performed.   Sterile technique was observed and injection of 1 cc of Depo-Medrol 40 mg with several cc's of plain xylocaine. Anesthesia was provided by ethyl chloride and a 20-gauge needle was used to inject the knee area. The injection was tolerated well.  A band aid dressing was applied.  The patient was advised to apply ice later today and tomorrow to the injection sight as needed.

## 2016-05-10 ENCOUNTER — Encounter: Payer: Self-pay | Admitting: Orthopaedic Surgery

## 2016-05-10 ENCOUNTER — Ambulatory Visit (INDEPENDENT_AMBULATORY_CARE_PROVIDER_SITE_OTHER): Payer: BLUE CROSS/BLUE SHIELD | Admitting: Orthopaedic Surgery

## 2016-05-10 VITALS — BP 140/80 | HR 67 | Temp 97.5°F | Resp 16 | Ht 63.0 in | Wt 271.0 lb

## 2016-05-10 DIAGNOSIS — M25562 Pain in left knee: Secondary | ICD-10-CM | POA: Diagnosis not present

## 2016-05-10 MED ORDER — HYDROCODONE-ACETAMINOPHEN 5-325 MG PO TABS: 1.0000 | ORAL_TABLET | ORAL | Status: DC | PRN

## 2016-05-10 NOTE — Progress Notes (Signed)
CC:  I have pain of my left knee. I would like an injection.  The patient has chronic pain of the left knee.  There is no recent trauma.  There is no redness.  Injections in the past have helped.  The knee has no redness, has an effusion and crepitus present.  ROM of the knee is 0-105.  Impression:  Chronic knee pain left.  Return: one month  PROCEDURE NOTE:  The patient requests injections of the left knee , verbal consent was obtained.  The left knee was prepped appropriately after time out was performed.   Sterile technique was observed and injection of 1 cc of Depo-Medrol 40 mg with several cc's of plain xylocaine. Anesthesia was provided by ethyl chloride and a 20-gauge needle was used to inject the knee area. The injection was tolerated well.  A band aid dressing was applied.  The patient was advised to apply ice later today and tomorrow to the injection sight as needed.  Electronically Signed Sanjuana Kava, MD 6/29/20178:41 AM

## 2016-06-07 ENCOUNTER — Ambulatory Visit (INDEPENDENT_AMBULATORY_CARE_PROVIDER_SITE_OTHER): Payer: BLUE CROSS/BLUE SHIELD | Admitting: Orthopaedic Surgery

## 2016-06-07 ENCOUNTER — Encounter: Payer: Self-pay | Admitting: Orthopaedic Surgery

## 2016-06-07 ENCOUNTER — Telehealth: Payer: Self-pay | Admitting: Orthopaedic Surgery

## 2016-06-07 VITALS — BP 136/66 | HR 65 | Temp 97.5°F | Ht 62.0 in | Wt 274.2 lb

## 2016-06-07 DIAGNOSIS — M25562 Pain in left knee: Secondary | ICD-10-CM

## 2016-06-07 MED ORDER — HYDROCODONE-ACETAMINOPHEN 5-325 MG PO TABS: 1.0000 | ORAL_TABLET | ORAL | 0 refills | Status: DC | PRN

## 2016-06-07 NOTE — Progress Notes (Signed)
Patient [id number] D Mccarroll, female DOB:[birth year], 50 y.o. [id number]  Chief Complaint  Patient presents with  . Follow-up    Left knee pain    HPI  Erica Greer is a 50 y.o. female who has chronic left knee pain.  She has no new trauma, no giving way, no locking, no redness.  She has swelling and popping.  She is active.  The injection last time helped a lot. HPI  Body mass index is 50.15 kg/m.  ROS  Review of Systems  HENT: Negative for congestion.   Respiratory: Negative for cough and shortness of breath.   Cardiovascular: Negative for chest pain and leg swelling.  Endocrine: Positive for cold intolerance.  Musculoskeletal: Positive for arthralgias, gait problem and joint swelling.  Allergic/Immunologic: Positive for environmental allergies.  Neurological: Negative for numbness.    Past Medical History:  Diagnosis Date  . Ovarian cyst    right     Past Surgical History:  Procedure Laterality Date  . LAPAROSCOPIC SALPINGO OOPHERECTOMY Right 10/04/2015   Procedure: LAPAROSCOPIC RIGHT SALPINGO OOPHORECTOMY;  Surgeon: Jonnie Kind, MD;  Location: [facility];  Service: Gynecology;  Laterality: Right;  . LAPAROSCOPIC UNILATERAL SALPINGECTOMY Left 10/04/2015   Procedure: LAPAROSCOPIC LEFT SALPINGECTOMY;  Surgeon: Jonnie Kind, MD;  Location: [facility];  Service: Gynecology;  Laterality: Left;  . OVARIAN CYST REMOVAL Left   . TUBAL LIGATION Left    Unilateral    Family History  Problem Relation Age of Onset  . Diabetes Mother   . Cancer Father   . Seizures Sister     Social History Social History  Substance Use Topics  . Smoking status: Never Smoker  . Smokeless tobacco: Never Used  . Alcohol use No    Allergies  Allergen Reactions  . Hydrocodone Nausea And Vomiting    Current Outpatient Prescriptions  Medication Sig Dispense Refill  . acetaminophen (TYLENOL) 500 MG tablet Take 500 mg by mouth 2 (two) times daily as needed for moderate pain. Reported  on 03/01/2016    . diclofenac (VOLTAREN) 75 MG EC tablet Take 1 tablet (75 mg total) by mouth 2 (two) times daily with a meal. 60 tablet 2  . estradiol (ESTRACE VAGINAL) 0.1 MG/GM vaginal cream Place [id number] Applicatorfuls vaginally at bedtime. Use nightly x 2wk, then 3 times weekly, insert to the vagina before bedtime. 42.5 g 3  . HYDROcodone-acetaminophen (NORCO/VICODIN) 5-325 MG tablet Take 1 tablet by mouth every 4 (four) hours as needed for moderate pain (Must last  15 days.Do not take and drive a car or use machinery.). 60 tablet 0  . naproxen sodium (ANAPROX) 220 MG tablet Take 440 mg by mouth daily as needed (pain). Reported on 03/01/2016    . ondansetron (ZOFRAN) 4 MG tablet Take 1 tablet (4 mg total) by mouth every 6 (six) hours. Use for nausea/vomiting 10 tablet 0  . promethazine (PHENERGAN) 12.5 MG tablet Take 1 tablet (12.5 mg total) by mouth every 6 (six) hours as needed for nausea or vomiting. 12 tablet 0   No current facility-administered medications for this visit.      Physical Exam  Blood pressure 136/66, pulse 65, temperature 97.5 F (36.4 C), height 5\' 2"  (1.575 m), weight 274 lb 3.2 oz (124.4 kg), last menstrual period 02/11/2015.  Constitutional: overall normal hygiene, normal nutrition, well developed, normal grooming, normal body habitus. Assistive device:none  Musculoskeletal: gait and station Limp left, muscle tone and strength are normal, no tremors or atrophy is present.  Marland Kitchen  Neurological: coordination overall normal.  Deep tendon reflex/nerve stretch intact.  Sensation normal.  Cranial nerves II-XII intact.   Skin:   normal overall no scars, lesions, ulcers or rashes. No psoriasis.  Psychiatric: Alert and oriented x 3.  Recent memory intact, remote memory unclear.  Normal mood and affect. Well groomed.  Good eye contact.  Cardiovascular: overall no swelling, no varicosities, no edema bilaterally, normal temperatures of the legs and arms, no clubbing, cyanosis and  good capillary refill.  Lymphatic: palpation is normal.  The left lower extremity is examined:  Inspection:  Thigh:  Non-tender and no defects  Knee has swelling 1+ effusion.                        Joint tenderness is present                        Patient is tender over the medial joint line  Lower Leg:  Has normal appearance and no tenderness or defects  Ankle:  Non-tender and no defects  Foot:  Non-tender and no defects Range of Motion:  Knee:  Range of motion is: 0-110                        Crepitus is  present  Ankle:  Range of motion is normal. Strength and Tone:  The left lower extremity has normal strength and tone. Stability:  Knee:  The knee is stable.  Ankle:  The ankle is stable.    The patient has been educated about the nature of the problem(s) and counseled on treatment options.  The patient appeared to understand what I have discussed and is in agreement with it.  Encounter Diagnoses  Name Primary?  . Left knee pain Yes  . Morbid obesity due to excess calories [facility])     PLAN Call if any problems.  Precautions discussed.  Continue current medications.   Return to clinic 1 month   Electronically Signed Sanjuana Kava, MD 7/27/20179:38 AM

## 2016-07-04 ENCOUNTER — Ambulatory Visit (INDEPENDENT_AMBULATORY_CARE_PROVIDER_SITE_OTHER): Payer: BLUE CROSS/BLUE SHIELD | Admitting: Orthopaedic Surgery

## 2016-07-04 ENCOUNTER — Encounter: Payer: Self-pay | Admitting: Orthopaedic Surgery

## 2016-07-04 VITALS — BP 152/88 | HR 67 | Temp 97.5°F | Ht 64.0 in | Wt 283.0 lb

## 2016-07-04 DIAGNOSIS — M25562 Pain in left knee: Secondary | ICD-10-CM

## 2016-07-04 MED ORDER — HYDROCODONE-ACETAMINOPHEN 5-325 MG PO TABS: 1.0000 | ORAL_TABLET | ORAL | 0 refills | Status: DC | PRN

## 2016-07-04 NOTE — Progress Notes (Signed)
CC:  I have pain of my left knee. I would like an injection.  The patient has chronic pain of the left knee.  There is no recent trauma.  There is no redness.  Injections in the past have helped.  The knee has no redness, has an effusion and crepitus present.  ROM of the knee is 0-105.  Impression:  Chronic knee pain left.  Return: 1 month   PROCEDURE NOTE:  The patient requests injections of the left knee , verbal consent was obtained.  The left knee was prepped appropriately after time out was performed.   Sterile technique was observed and injection of 1 cc of Depo-Medrol 40 mg with several cc's of plain xylocaine. Anesthesia was provided by ethyl chloride and a 20-gauge needle was used to inject the knee area. The injection was tolerated well.  A band aid dressing was applied.  The patient was advised to apply ice later today and tomorrow to the injection sight as needed.   Electronically Signed Sanjuana Kava, MD 8/23/20178:58 AM

## 2016-08-01 ENCOUNTER — Encounter: Payer: Self-pay | Admitting: Orthopaedic Surgery

## 2016-08-01 ENCOUNTER — Ambulatory Visit (INDEPENDENT_AMBULATORY_CARE_PROVIDER_SITE_OTHER): Payer: BLUE CROSS/BLUE SHIELD | Admitting: Orthopaedic Surgery

## 2016-08-01 VITALS — BP 146/67 | HR 71 | Temp 97.7°F | Ht 64.0 in | Wt 280.0 lb

## 2016-08-01 DIAGNOSIS — M25562 Pain in left knee: Secondary | ICD-10-CM

## 2016-08-01 MED ORDER — HYDROCODONE-ACETAMINOPHEN 5-325 MG PO TABS: 1.0000 | ORAL_TABLET | Freq: Four times a day (QID) | ORAL | 0 refills | Status: DC | PRN

## 2016-08-01 NOTE — Progress Notes (Signed)
CC:  I have pain of my left knee. I would like an injection.  The patient has chronic pain of the left knee.  There is no recent trauma.  There is no redness.  Injections in the past have helped.  The knee has no redness, has an effusion and crepitus present.  ROM of the left knee is 0-105.  She is set to to go beach as soon as she leaves here  Impression:  Chronic knee pain left  Return: 1 month  PROCEDURE NOTE:  The patient requests injections of the lft knee, verbal consent was obtained.  The left knee was prepped appropriately after time out was performed.   Sterile technique was observed and injection of 1 cc of Depo-Medrol 40 mg with several cc's of plain xylocaine. Anesthesia was provided by ethyl chloride and a 20-gauge needle was used to inject the knee area. The injection was tolerated well.  A band aid dressing was applied.  The patient was advised to apply ice later today and tomorrow to the injection sight as needed.  Electronically Signed Sanjuana Kava, MD 9/20/20178:26 AM

## 2016-08-16 ENCOUNTER — Emergency Department (HOSPITAL_COMMUNITY)
Admission: EM | Admit: 2016-08-16 | Discharge: 2016-08-16 | Disposition: A | Discharge status: Home / Self Care | Payer: BLUE CROSS/BLUE SHIELD | Attending: Emergency Medicine | Admitting: Emergency Medicine

## 2016-08-16 ENCOUNTER — Encounter (HOSPITAL_COMMUNITY): Payer: Self-pay

## 2016-08-16 DIAGNOSIS — H1032 Unspecified acute conjunctivitis, left eye: Secondary | ICD-10-CM

## 2016-08-16 DIAGNOSIS — H5712 Ocular pain, left eye: Secondary | ICD-10-CM

## 2016-08-16 DIAGNOSIS — H01004 Unspecified blepharitis left upper eyelid: Secondary | ICD-10-CM | POA: Diagnosis not present

## 2016-08-16 MED ORDER — IBUPROFEN 800 MG PO TABS
800.0000 mg | ORAL_TABLET | Freq: Once | ORAL | Status: AC
  Administered 2016-08-16: 800 mg via ORAL
  Filled 2016-08-16: qty 1

## 2016-08-16 MED ORDER — TOBRAMYCIN 0.3 % OP SOLN: 1.0000 [drp] | OPHTHALMIC | 0 refills | Status: AC

## 2016-08-16 MED ORDER — TETRACAINE HCL 0.5 % OP SOLN
2.0000 [drp] | Freq: Once | OPHTHALMIC | Status: AC
  Administered 2016-08-16: 2 [drp] via OPHTHALMIC
  Filled 2016-08-16: qty 4

## 2016-08-16 MED ORDER — FLUORESCEIN SODIUM 1 MG OP STRP
1.0000 | ORAL_STRIP | Freq: Once | OPHTHALMIC | Status: AC
  Administered 2016-08-16: 1 via OPHTHALMIC
  Filled 2016-08-16: qty 1

## 2016-08-16 NOTE — ED Triage Notes (Signed)
Pt reports left eye itching x 1 week.  Yesterday left eye became swollen and watering.  Reports has been using otc eye drops without relief.

## 2016-08-16 NOTE — ED Provider Notes (Signed)
[place] DEPT Provider Note   CSN: [id number] Arrival date & time: 08/16/16  0830     History   Chief Complaint Chief Complaint  Patient presents with  . Eye Pain    HPI Erica Greer is a 50 y.o. female.  The history is provided by the patient.  Eye Pain  This is a new problem. The current episode started more than 2 days ago. The problem occurs daily. The problem has been gradually worsening. Nothing aggravates the symptoms. Nothing relieves the symptoms.   Pt reports redness/swelling/drainage and itching to left eye Denies trauma or foreign body to eye No h/o surgery to OS Reports she wears glasses only , no contact lenses  Past Medical History:  Diagnosis Date  . Ovarian cyst    right     Patient Active Problem List   Diagnosis Date Noted  . Mass of right ovary 09/16/2015    Past Surgical History:  Procedure Laterality Date  . LAPAROSCOPIC SALPINGO OOPHERECTOMY Right 10/04/2015   Procedure: LAPAROSCOPIC RIGHT SALPINGO OOPHORECTOMY;  Surgeon: Jonnie Kind, MD;  Location: [facility];  Service: Gynecology;  Laterality: Right;  . LAPAROSCOPIC UNILATERAL SALPINGECTOMY Left 10/04/2015   Procedure: LAPAROSCOPIC LEFT SALPINGECTOMY;  Surgeon: Jonnie Kind, MD;  Location: [facility];  Service: Gynecology;  Laterality: Left;  . OVARIAN CYST REMOVAL Left   . TUBAL LIGATION Left    Unilateral    OB History    Gravida Para Term Preterm AB Living   5 5 5     5    SAB TAB Ectopic Multiple Live Births                   Home Medications    Prior to Admission medications   Medication Sig Start Date End Date Taking? Authorizing Provider  acetaminophen (TYLENOL) 500 MG tablet Take 500 mg by mouth 2 (two) times daily as needed for moderate pain. Reported on 03/01/2016    Historical Provider, MD  diclofenac (VOLTAREN) 75 MG EC tablet TAKE 1 TABLET(75 MG) BY MOUTH TWICE DAILY WITH A MEAL 06/11/16   Sanjuana Kava, MD  estradiol (ESTRACE VAGINAL) 0.1 MG/GM vaginal cream  Place [id number] Applicatorfuls vaginally at bedtime. Use nightly x 2wk, then 3 times weekly, insert to the vagina before bedtime. 10/14/15   Jonnie Kind, MD  HYDROcodone-acetaminophen (NORCO/VICODIN) 5-325 MG tablet Take 1 tablet by mouth every 6 (six) hours as needed for moderate pain (Must last15 days.Do not take and drive a car or use machinery.). 08/01/16   Sanjuana Kava, MD  naproxen sodium (ANAPROX) 220 MG tablet Take 440 mg by mouth daily as needed (pain). Reported on 03/01/2016    Historical Provider, MD  ondansetron (ZOFRAN) 4 MG tablet Take 1 tablet (4 mg total) by mouth every 6 (six) hours. Use for nausea/vomiting 02/23/16   [name], PA-C  promethazine (PHENERGAN) 12.5 MG tablet Take 1 tablet (12.5 mg total) by mouth every 6 (six) hours as needed for nausea or vomiting. 01/22/16   [name], PA-C  tobramycin (TOBREX) 0.3 % ophthalmic solution Place 1 drop into the left eye every 4 (four) hours. 08/16/16 08/21/16  Ripley Fraise, MD    Family History Family History  Problem Relation Age of Onset  . Diabetes Mother   . Cancer Father   . Seizures Sister     Social History Social History  Substance Use Topics  . Smoking status: Never Smoker  . Smokeless tobacco: Never Used  . Alcohol use No  Allergies   Hydrocodone   Review of Systems Review of Systems  Constitutional: Negative for fever.  Eyes: Positive for pain.     Physical Exam Updated Vital Signs BP 128/65 (BP Location: Left Arm)   Pulse 70   Temp 97.9 F (36.6 C) (Oral)   Resp 20   Ht 5\' 4"  (1.626 m)   Wt 125.2 kg   LMP 02/11/2015   SpO2 99%   BMI 47.38 kg/m   Physical Exam  CONSTITUTIONAL: Well developed/well nourished HEAD: Normocephalic/atraumatic EYES: EOMI/PERRL, mild conjunctival injection on left.  Discharge noted from OS.  No foreign body.  No abrasion.  No proptosis.  Full ROM of OS.   ENMT: Mucous membranes moist NECK: supple no meningeal signs CV: S1/S2 noted LUNGS: Lungs  are clear to auscultation bilaterally, no apparent distress ABDOMEN: soft, nontender NEURO: Pt is awake/alert/appropriate, moves all extremitiesx4.  No facial droop.   SKIN: warm, color normal PSYCH: no abnormalities of mood noted, alert and oriented to situation  ED Treatments / Results  Labs (all labs ordered are listed, but only abnormal results are displayed) Labs Reviewed - No data to display  EKG  EKG Interpretation None       Radiology No results found.  Procedures Procedures (including critical care time)  Medications Ordered in ED Medications  ibuprofen (ADVIL,MOTRIN) tablet 800 mg (not administered)  tetracaine (PONTOCAINE) 0.5 % ophthalmic solution 2 drop (2 drops Left Eye Given by Other 08/16/16 0844)  fluorescein ophthalmic strip 1 strip (1 strip Left Eye Given by Other 08/16/16 [id number])     Initial Impression / Assessment and Plan / ED Course  I have reviewed the triage vital signs and the nursing notes.    Clinical Course    Referred to ophthalmology if no improvement in 3-5 days   Final Clinical Impressions(s) / ED Diagnoses   Final diagnoses:  Pain of left eye  Acute bacterial conjunctivitis of left eye  Blepharitis of left upper eyelid, unspecified type    New Prescriptions New Prescriptions   TOBRAMYCIN (TOBREX) 0.3 % OPHTHALMIC SOLUTION    Place 1 drop into the left eye every 4 (four) hours.     Ripley Fraise, MD 08/16/16 [phone]

## 2016-08-29 ENCOUNTER — Encounter: Payer: Self-pay | Admitting: Orthopaedic Surgery

## 2016-08-29 ENCOUNTER — Ambulatory Visit (INDEPENDENT_AMBULATORY_CARE_PROVIDER_SITE_OTHER): Payer: BLUE CROSS/BLUE SHIELD | Admitting: Orthopaedic Surgery

## 2016-08-29 VITALS — BP 122/78 | HR 72 | Temp 97.5°F | Ht 64.0 in | Wt 289.0 lb

## 2016-08-29 DIAGNOSIS — M25562 Pain in left knee: Secondary | ICD-10-CM

## 2016-08-29 DIAGNOSIS — G8929 Other chronic pain: Secondary | ICD-10-CM | POA: Diagnosis not present

## 2016-08-29 MED ORDER — HYDROCODONE-ACETAMINOPHEN 5-325 MG PO TABS: 1.0000 | ORAL_TABLET | Freq: Four times a day (QID) | ORAL | 0 refills | Status: DC | PRN

## 2016-08-29 NOTE — Progress Notes (Signed)
CC:  I have pain of my left knee. I would like an injection.  The patient has chronic pain of the left knee.  There is no recent trauma.  There is no redness.  Injections in the past have helped.  The knee has no redness, has an effusion and crepitus present.  ROM of the left knee is 0-110.  Impression:  Chronic knee pain left  Return: 1 month  PROCEDURE NOTE:  The patient requests injections of the lft knee, verbal consent was obtained.  The left knee was prepped appropriately after time out was performed.   Sterile technique was observed and injection of 1 cc of Depo-Medrol 40 mg with several cc's of plain xylocaine. Anesthesia was provided by ethyl chloride and a 20-gauge needle was used to inject the knee area. The injection was tolerated well.  A band aid dressing was applied.  The patient was advised to apply ice later today and tomorrow to the injection sight as needed.  Electronically Signed Sanjuana Kava, MD 10/18/20178:37 AM

## 2016-10-03 ENCOUNTER — Ambulatory Visit (INDEPENDENT_AMBULATORY_CARE_PROVIDER_SITE_OTHER): Payer: BLUE CROSS/BLUE SHIELD | Admitting: Orthopaedic Surgery

## 2016-10-03 DIAGNOSIS — G8929 Other chronic pain: Secondary | ICD-10-CM | POA: Diagnosis not present

## 2016-10-03 DIAGNOSIS — M25562 Pain in left knee: Secondary | ICD-10-CM

## 2016-10-03 MED ORDER — HYDROCODONE-ACETAMINOPHEN 5-325 MG PO TABS: 1.0000 | ORAL_TABLET | Freq: Four times a day (QID) | ORAL | 0 refills | Status: DC | PRN

## 2016-10-03 NOTE — Progress Notes (Signed)
CC:  I have pain of my left knee. I would like an injection.  The patient has chronic pain of the left knee.  There is no recent trauma.  There is no redness.  Injections in the past have helped.  The knee has no redness, has an effusion and crepitus present.  ROM of the left knee is 0-105.  Impression:  Chronic knee pain left  Return: 1 month  PROCEDURE NOTE:  The patient requests injections of the left knee, verbal consent was obtained.  The left knee was prepped appropriately after time out was performed.   Sterile technique was observed and injection of 1 cc of Depo-Medrol 40 mg with several cc's of plain xylocaine. Anesthesia was provided by ethyl chloride and a 20-gauge needle was used to inject the knee area. The injection was tolerated well.  A band aid dressing was applied.  The patient was advised to apply ice later today and tomorrow to the injection sight as needed.  Electronically Signed Sanjuana Kava, MD 11/22/20178:17 AM

## 2016-10-31 ENCOUNTER — Ambulatory Visit (INDEPENDENT_AMBULATORY_CARE_PROVIDER_SITE_OTHER): Payer: BLUE CROSS/BLUE SHIELD | Admitting: Orthopaedic Surgery

## 2016-10-31 VITALS — BP 144/88 | HR 74 | Temp 97.7°F | Ht 64.0 in | Wt 285.0 lb

## 2016-10-31 DIAGNOSIS — G8929 Other chronic pain: Secondary | ICD-10-CM

## 2016-10-31 DIAGNOSIS — M25562 Pain in left knee: Secondary | ICD-10-CM | POA: Diagnosis not present

## 2016-10-31 MED ORDER — HYDROCODONE-ACETAMINOPHEN 5-325 MG PO TABS: 1.0000 | ORAL_TABLET | Freq: Four times a day (QID) | ORAL | 0 refills | Status: DC | PRN

## 2016-10-31 NOTE — Progress Notes (Signed)
CC:  I have pain of my left knee. I would like an injection.  The patient has chronic pain of the left knee.  There is no recent trauma.  There is no redness.  Injections in the past have helped.  The knee has no redness, has an effusion and crepitus present.  ROM of the left knee is 0-105.  Impression:  Chronic knee pain left  Return: 1 month  PROCEDURE NOTE:  The patient requests injections of the left knee, verbal consent was obtained.  The left knee was prepped appropriately after time out was performed.   Sterile technique was observed and injection of 1 cc of Depo-Medrol 40 mg with several cc's of plain xylocaine. Anesthesia was provided by ethyl chloride and a 20-gauge needle was used to inject the knee area. The injection was tolerated well.  A band aid dressing was applied.  The patient was advised to apply ice later today and tomorrow to the injection sight as needed.  Electronically Signed Sanjuana Kava, MD 12/20/20178:36 AM

## 2016-11-09 ENCOUNTER — Encounter (HOSPITAL_COMMUNITY): Payer: Self-pay

## 2016-11-09 ENCOUNTER — Emergency Department (HOSPITAL_COMMUNITY)
Admission: EM | Admit: 2016-11-09 | Discharge: 2016-11-10 | Disposition: A | Discharge status: Home / Self Care | Payer: BLUE CROSS/BLUE SHIELD | Attending: Emergency Medicine | Admitting: Emergency Medicine

## 2016-11-09 DIAGNOSIS — Z791 Long term (current) use of non-steroidal anti-inflammatories (NSAID): Secondary | ICD-10-CM | POA: Diagnosis not present

## 2016-11-09 DIAGNOSIS — H6122 Impacted cerumen, left ear: Secondary | ICD-10-CM | POA: Diagnosis not present

## 2016-11-09 DIAGNOSIS — H9202 Otalgia, left ear: Secondary | ICD-10-CM | POA: Diagnosis present

## 2016-11-09 NOTE — ED Triage Notes (Signed)
Left ear pain onset yesterday, states she feels like there is fluid in it, has been using otc ear wax drops

## 2016-11-10 MED ORDER — HYDROGEN PEROXIDE 3 % EX SOLN
CUTANEOUS | Status: AC
  Filled 2016-11-10: qty 473

## 2016-11-10 NOTE — ED Provider Notes (Signed)
[place] DEPT Provider Note   CSN: [id number] Arrival date & time: 11/09/16  2327     History   Chief Complaint Chief Complaint  Patient presents with  . Otalgia    HPI Erica Greer is a 50 y.o. female presenting with a one day history of left ear pain, decreased hearing acuity and sensation of fluid in the ear.  She denies any recent sinus or nasal congestion or other uri sx.  There has been no drainage from the ear.  She has applied debrox solution today without relief. Denies dizziness, headache or other complaints.  The history is provided by the patient.    Past Medical History:  Diagnosis Date  . Ovarian cyst    right     Patient Active Problem List   Diagnosis Date Noted  . Mass of right ovary 09/16/2015    Past Surgical History:  Procedure Laterality Date  . LAPAROSCOPIC SALPINGO OOPHERECTOMY Right 10/04/2015   Procedure: LAPAROSCOPIC RIGHT SALPINGO OOPHORECTOMY;  Surgeon: Jonnie Kind, MD;  Location: [facility];  Service: Gynecology;  Laterality: Right;  . LAPAROSCOPIC UNILATERAL SALPINGECTOMY Left 10/04/2015   Procedure: LAPAROSCOPIC LEFT SALPINGECTOMY;  Surgeon: Jonnie Kind, MD;  Location: [facility];  Service: Gynecology;  Laterality: Left;  . OVARIAN CYST REMOVAL Left   . TUBAL LIGATION Left    Unilateral    OB History    Gravida Para Term Preterm AB Living   5 5 5     5    SAB TAB Ectopic Multiple Live Births                   Home Medications    Prior to Admission medications   Medication Sig Start Date End Date Taking? Authorizing Provider  acetaminophen (TYLENOL) 500 MG tablet Take 500 mg by mouth 2 (two) times daily as needed for moderate pain. Reported on 03/01/2016    Historical Provider, MD  diclofenac (VOLTAREN) 75 MG EC tablet TAKE 1 TABLET(75 MG) BY MOUTH TWICE DAILY WITH A MEAL 06/11/16   Sanjuana Kava, MD  estradiol (ESTRACE VAGINAL) 0.1 MG/GM vaginal cream Place [id number] Applicatorfuls vaginally at bedtime. Use nightly x 2wk, then  3 times weekly, insert to the vagina before bedtime. 10/14/15   Jonnie Kind, MD  HYDROcodone-acetaminophen (NORCO/VICODIN) 5-325 MG tablet Take 1 tablet by mouth every 6 (six) hours as needed for moderate pain (Must last15 days.Do not take and drive a car or use machinery.). 10/31/16   Sanjuana Kava, MD  HYDROcodone-acetaminophen (NORCO/VICODIN) 5-325 MG tablet Take 1 tablet by mouth every 6 (six) hours as needed for moderate pain (Must last15 days.Do not take and drive a car or use machinery.). 10/31/16   Sanjuana Kava, MD  naproxen sodium (ANAPROX) 220 MG tablet Take 440 mg by mouth daily as needed (pain). Reported on 03/01/2016    Historical Provider, MD  ondansetron (ZOFRAN) 4 MG tablet Take 1 tablet (4 mg total) by mouth every 6 (six) hours. Use for nausea/vomiting 02/23/16   [name], PA-C  promethazine (PHENERGAN) 12.5 MG tablet Take 1 tablet (12.5 mg total) by mouth every 6 (six) hours as needed for nausea or vomiting. 01/22/16   [name], PA-C    Family History Family History  Problem Relation Age of Onset  . Diabetes Mother   . Cancer Father   . Seizures Sister     Social History Social History  Substance Use Topics  . Smoking status: Never Smoker  . Smokeless tobacco: Never Used  .  Alcohol use No     Allergies   Hydrocodone   Review of Systems Review of Systems  Constitutional: Negative for fever.  HENT: Positive for ear pain and hearing loss. Negative for congestion and sore throat.   Eyes: Negative.   Respiratory: Negative.   Cardiovascular: Negative.   Gastrointestinal: Negative.   Genitourinary: Negative.   Musculoskeletal: Negative.   Skin: Negative.  Negative for rash and wound.  Neurological: Negative for dizziness, light-headedness and headaches.  Psychiatric/Behavioral: Negative.      Physical Exam Updated Vital Signs BP 159/77 (BP Location: Left Arm)   Pulse 82   Temp 98.1 F (36.7 C) (Oral)   Resp 18   Ht 5\' 4"  (1.626 m)    Wt 129.7 kg   LMP 02/11/2015   SpO2 100%   BMI 49.09 kg/m   Physical Exam  Constitutional: She appears well-developed and well-nourished.  HENT:  Head: Normocephalic and atraumatic.  Right Ear: Hearing and tympanic membrane normal.  Left Ear: No mastoid tenderness. Decreased hearing is noted.  Cerumen impaction left ear.    Eyes: Conjunctivae are normal.  Neck: Normal range of motion.  Cardiovascular: Normal rate, regular rhythm, normal heart sounds and intact distal pulses.   Pulmonary/Chest: Effort normal and breath sounds normal. She has no wheezes.  Abdominal: Soft. Bowel sounds are normal. There is no tenderness.  Musculoskeletal: Normal range of motion.  Neurological: She is alert.  Skin: Skin is warm and dry.  Psychiatric: She has a normal mood and affect.  Nursing note and vitals reviewed.    ED Treatments / Results  Labs (all labs ordered are listed, but only abnormal results are displayed) Labs Reviewed - No data to display  EKG  EKG Interpretation None       Radiology No results found.  Procedures .Ear Cerumen Removal Date/Time: 11/10/2016 1:41 AM Performed by: Evalee Jefferson Authorized by: Evalee Jefferson   Consent:    Consent obtained:  Verbal   Consent given by:  Patient   Risks discussed:  Dizziness, incomplete removal, pain and TM perforation   Alternatives discussed:  No treatment Procedure details:    Location:  L ear   Procedure type: irrigation   Post-procedure details:    Hearing quality:  Improved   Patient tolerance of procedure:  Tolerated well, no immediate complications Comments:     Large cerumen plug flushed   (including critical care time)     Medications Ordered in ED Medications  hydrogen peroxide 3 % external solution (not administered)     Initial Impression / Assessment and Plan / ED Course  I have reviewed the triage vital signs and the nursing notes.  Pertinent labs & imaging results that were available during my  care of the patient were reviewed by me and considered in my medical decision making (see chart for details).  Clinical Course     Prn f/u anticipated.  No complications from cerumen removal.  Final Clinical Impressions(s) / ED Diagnoses   Final diagnoses:  Impacted cerumen of left ear    New Prescriptions New Prescriptions   No medications on file     Evalee Jefferson, PA-C 11/10/16 [place], DO 11/10/16 [phone]

## 2016-11-10 NOTE — ED Notes (Signed)
Pt states understanding of care given and follow up instructions.  Pt alert and oriented.  Ambulated from ED with steady gait 

## 2016-11-28 ENCOUNTER — Ambulatory Visit: Payer: BLUE CROSS/BLUE SHIELD | Admitting: Orthopaedic Surgery

## 2016-11-29 ENCOUNTER — Ambulatory Visit: Payer: BLUE CROSS/BLUE SHIELD | Admitting: Orthopaedic Surgery

## 2016-12-04 ENCOUNTER — Ambulatory Visit (INDEPENDENT_AMBULATORY_CARE_PROVIDER_SITE_OTHER): Payer: BLUE CROSS/BLUE SHIELD | Admitting: Orthopaedic Surgery

## 2016-12-04 ENCOUNTER — Encounter: Payer: Self-pay | Admitting: Orthopaedic Surgery

## 2016-12-04 VITALS — BP 114/60 | HR 78 | Temp 98.1°F | Ht 64.0 in | Wt 282.0 lb

## 2016-12-04 DIAGNOSIS — G8929 Other chronic pain: Secondary | ICD-10-CM | POA: Diagnosis not present

## 2016-12-04 DIAGNOSIS — M25562 Pain in left knee: Secondary | ICD-10-CM | POA: Diagnosis not present

## 2016-12-04 MED ORDER — HYDROCODONE-ACETAMINOPHEN 5-325 MG PO TABS: 1.0000 | ORAL_TABLET | Freq: Four times a day (QID) | ORAL | 0 refills | Status: DC | PRN

## 2016-12-04 NOTE — Progress Notes (Signed)
CC:  I have pain of my left knee. I would like an injection.  The patient has chronic pain of the left knee.  There is no recent trauma.  There is no redness.  Injections in the past have helped.  The knee has no redness, has an effusion and crepitus present.  ROM of the left knee is 0-105.  Impression:  Chronic knee pain left  Return: 1 month  PROCEDURE NOTE:  The patient requests injections of the left knee, verbal consent was obtained.  The left knee was prepped appropriately after time out was performed.   Sterile technique was observed and injection of 1 cc of Depo-Medrol 40 mg with several cc's of plain xylocaine. Anesthesia was provided by ethyl chloride and a 20-gauge needle was used to inject the knee area. The injection was tolerated well.  A band aid dressing was applied.  The patient was advised to apply ice later today and tomorrow to the injection sight as needed.  New pain medicine given after checking the state narcotic web site.  Electronically Signed Sanjuana Kava, MD 1/23/20188:29 AM

## 2017-01-01 ENCOUNTER — Ambulatory Visit (INDEPENDENT_AMBULATORY_CARE_PROVIDER_SITE_OTHER): Payer: BLUE CROSS/BLUE SHIELD | Admitting: Orthopaedic Surgery

## 2017-01-01 ENCOUNTER — Encounter: Payer: Self-pay | Admitting: Orthopaedic Surgery

## 2017-01-01 VITALS — BP 127/72 | HR 80 | Temp 97.7°F | Ht 64.0 in | Wt 286.0 lb

## 2017-01-01 DIAGNOSIS — M25562 Pain in left knee: Secondary | ICD-10-CM

## 2017-01-01 DIAGNOSIS — G8929 Other chronic pain: Secondary | ICD-10-CM | POA: Diagnosis not present

## 2017-01-01 MED ORDER — HYDROCODONE-ACETAMINOPHEN 5-325 MG PO TABS: 1.0000 | ORAL_TABLET | Freq: Four times a day (QID) | ORAL | 0 refills | Status: DC | PRN

## 2017-01-01 NOTE — Progress Notes (Signed)
CC:  I have pain of my left knee. I would like an injection.  The patient has chronic pain of the left knee.  There is no recent trauma.  There is no redness.  Injections in the past have helped.  The knee has no redness, has an effusion and crepitus present.  ROM of the left knee is 0-110.  Impression:  Chronic knee pain left  Return: 1 month  PROCEDURE NOTE:  The patient requests injections of the left knee, verbal consent was obtained.  The left knee was prepped appropriately after time out was performed.   Sterile technique was observed and injection of 1 cc of Depo-Medrol 40 mg with several cc's of plain xylocaine. Anesthesia was provided by ethyl chloride and a 20-gauge needle was used to inject the knee area. The injection was tolerated well.  A band aid dressing was applied.  The patient was advised to apply ice later today and tomorrow to the injection sight as needed.  I have reviewed the [place] web site prior to prescribing narcotic medicine for this patient.  Electronically Signed Sanjuana Kava, MD 2/20/20188:23 AM

## 2017-01-29 ENCOUNTER — Encounter: Payer: Self-pay | Admitting: Orthopaedic Surgery

## 2017-01-29 ENCOUNTER — Ambulatory Visit (INDEPENDENT_AMBULATORY_CARE_PROVIDER_SITE_OTHER): Payer: BLUE CROSS/BLUE SHIELD | Admitting: Orthopaedic Surgery

## 2017-01-29 VITALS — BP 132/81 | HR 68 | Temp 97.5°F | Ht 64.0 in | Wt 287.0 lb

## 2017-01-29 DIAGNOSIS — G8929 Other chronic pain: Secondary | ICD-10-CM

## 2017-01-29 DIAGNOSIS — M25562 Pain in left knee: Secondary | ICD-10-CM | POA: Diagnosis not present

## 2017-01-29 MED ORDER — HYDROCODONE-ACETAMINOPHEN 5-325 MG PO TABS: 1.0000 | ORAL_TABLET | Freq: Four times a day (QID) | ORAL | 0 refills | Status: DC | PRN

## 2017-01-29 NOTE — Progress Notes (Signed)
CC:  I have pain of my left knee. I would like an injection.  The patient has chronic pain of the left knee.  There is no recent trauma.  There is no redness.  Injections in the past have helped.  The knee has no redness, has an effusion and crepitus present.  ROM of the left knee is 0-110.  Impression:  Chronic knee pain left  Return: 1 month  PROCEDURE NOTE:  The patient requests injections of the left knee, verbal consent was obtained.  The left knee was prepped appropriately after time out was performed.   Sterile technique was observed and injection of 1 cc of Depo-Medrol 40 mg with several cc's of plain xylocaine. Anesthesia was provided by ethyl chloride and a 20-gauge needle was used to inject the knee area. The injection was tolerated well.  A band aid dressing was applied.  The patient was advised to apply ice later today and tomorrow to the injection sight as needed.  I have reviewed the [place] web site prior to prescribing narcotic medicine for this patient.  Electronically Signed Sanjuana Kava, MD 3/20/20188:30 AM

## 2017-02-07 ENCOUNTER — Emergency Department (HOSPITAL_COMMUNITY)
Admission: EM | Admit: 2017-02-07 | Discharge: 2017-02-07 | Disposition: A | Discharge status: Home / Self Care | Payer: BLUE CROSS/BLUE SHIELD | Attending: Emergency Medicine | Admitting: Emergency Medicine

## 2017-02-07 ENCOUNTER — Encounter (HOSPITAL_COMMUNITY): Payer: Self-pay

## 2017-02-07 DIAGNOSIS — X58XXXA Exposure to other specified factors, initial encounter: Secondary | ICD-10-CM | POA: Insufficient documentation

## 2017-02-07 DIAGNOSIS — M545 Low back pain: Secondary | ICD-10-CM | POA: Diagnosis present

## 2017-02-07 DIAGNOSIS — Y939 Activity, unspecified: Secondary | ICD-10-CM | POA: Insufficient documentation

## 2017-02-07 DIAGNOSIS — S39012A Strain of muscle, fascia and tendon of lower back, initial encounter: Secondary | ICD-10-CM

## 2017-02-07 DIAGNOSIS — Y999 Unspecified external cause status: Secondary | ICD-10-CM | POA: Insufficient documentation

## 2017-02-07 DIAGNOSIS — Y929 Unspecified place or not applicable: Secondary | ICD-10-CM | POA: Diagnosis not present

## 2017-02-07 MED ORDER — METHOCARBAMOL 500 MG PO TABS: 500.0000 mg | ORAL_TABLET | Freq: Three times a day (TID) | ORAL | 0 refills | Status: DC

## 2017-02-07 MED ORDER — KETOROLAC TROMETHAMINE 60 MG/2ML IM SOLN
60.0000 mg | Freq: Once | INTRAMUSCULAR | Status: AC
Start: 2017-02-07 — End: 2017-02-07
  Administered 2017-02-07: 60 mg via INTRAMUSCULAR
  Filled 2017-02-07: qty 2

## 2017-02-07 MED ORDER — METHOCARBAMOL 500 MG PO TABS
1000.0000 mg | ORAL_TABLET | Freq: Once | ORAL | Status: AC
  Administered 2017-02-07: 1000 mg via ORAL
  Filled 2017-02-07: qty 2

## 2017-02-07 MED ORDER — DICLOFENAC SODIUM 75 MG PO TBEC: 75.0000 mg | DELAYED_RELEASE_TABLET | Freq: Two times a day (BID) | ORAL | 0 refills | Status: DC

## 2017-02-07 NOTE — ED Provider Notes (Signed)
[facility] DEPT Provider Note   CSN: [id number] Arrival date & time: 02/07/17  1517     History   Chief Complaint Chief Complaint  Patient presents with  . Back Pain    HPI Erica Greer is a 51 y.o. female.  HPI   Erica Greer is a 51 y.o. female who presents to the Emergency Department complaining of right sided low back pain for two days.  She describes a sharp, aching pain to her right lower back that is associated with movement, especially bending and twisting.  Pain improves somewhat at rest.  She is employed at a [employer] and stands and walks all day.  She denies known injury.  She has tried OTC muscle rubs and analgesics without relief.  She denies abdominal pain, lower extremity pain, weakness or numbness, fever, urine or bowel changes.     Past Medical History:  Diagnosis Date  . Ovarian cyst    right     Patient Active Problem List   Diagnosis Date Noted  . Mass of right ovary 09/16/2015    Past Surgical History:  Procedure Laterality Date  . LAPAROSCOPIC SALPINGO OOPHERECTOMY Right 10/04/2015   Procedure: LAPAROSCOPIC RIGHT SALPINGO OOPHORECTOMY;  Surgeon: Jonnie Kind, MD;  Location: [facility];  Service: Gynecology;  Laterality: Right;  . LAPAROSCOPIC UNILATERAL SALPINGECTOMY Left 10/04/2015   Procedure: LAPAROSCOPIC LEFT SALPINGECTOMY;  Surgeon: Jonnie Kind, MD;  Location: [facility];  Service: Gynecology;  Laterality: Left;  . OVARIAN CYST REMOVAL Left   . TUBAL LIGATION Left    Unilateral    OB History    Gravida Para Term Preterm AB Living   5 5 5     5    SAB TAB Ectopic Multiple Live Births                   Home Medications    Prior to Admission medications   Medication Sig Start Date End Date Taking? Authorizing Provider  acetaminophen (TYLENOL) 500 MG tablet Take 500 mg by mouth 2 (two) times daily as needed for moderate pain. Reported on 03/01/2016    Historical Provider, MD  diclofenac (VOLTAREN) 75 MG EC  tablet TAKE 1 TABLET(75 MG) BY MOUTH TWICE DAILY WITH A MEAL 06/11/16   Sanjuana Kava, MD  estradiol (ESTRACE VAGINAL) 0.1 MG/GM vaginal cream Place 8.33 Applicatorfuls vaginally at bedtime. Use nightly x 2wk, then 3 times weekly, insert to the vagina before bedtime. 10/14/15   Jonnie Kind, MD  HYDROcodone-acetaminophen (NORCO/VICODIN) 5-325 MG tablet Take 1 tablet by mouth every 6 (six) hours as needed for moderate pain (Must last15 days.Do not take and drive a car or use machinery.). 01/01/17   Sanjuana Kava, MD  HYDROcodone-acetaminophen (NORCO/VICODIN) 5-325 MG tablet Take 1 tablet by mouth every 6 (six) hours as needed for moderate pain (Must last15 days.Do not take and drive a car or use machinery.). 01/29/17   Sanjuana Kava, MD  naproxen sodium (ANAPROX) 220 MG tablet Take 440 mg by mouth daily as needed (pain). Reported on 03/01/2016    Historical Provider, MD  ondansetron (ZOFRAN) 4 MG tablet Take 1 tablet (4 mg total) by mouth every 6 (six) hours. Use for nausea/vomiting 02/23/16   [name], PA-C  promethazine (PHENERGAN) 12.5 MG tablet Take 1 tablet (12.5 mg total) by mouth every 6 (six) hours as needed for nausea or vomiting. 01/22/16   [name], PA-C    Family History Family History  Problem Relation Age  of Onset  . Diabetes Mother   . Cancer Father   . Seizures Sister     Social History Social History  Substance Use Topics  . Smoking status: Never Smoker  . Smokeless tobacco: Never Used  . Alcohol use No     Allergies   Hydrocodone   Review of Systems Review of Systems  Constitutional: Negative for fever.  Respiratory: Negative for shortness of breath.   Gastrointestinal: Negative for abdominal pain, constipation and vomiting.  Genitourinary: Negative for decreased urine volume, difficulty urinating, dysuria, flank pain and hematuria.  Musculoskeletal: Positive for back pain. Negative for joint swelling.  Skin: Negative for rash.  Neurological:  Negative for weakness and numbness.  All other systems reviewed and are negative.    Physical Exam Updated Vital Signs BP 120/66 (BP Location: Left Arm)   Pulse 79   Temp 98 F (36.7 C) (Oral)   Resp 18   Wt 130.2 kg   LMP 02/11/2015   SpO2 100%   BMI 49.26 kg/m   Physical Exam  Constitutional: She is oriented to person, place, and time. She appears well-developed and well-nourished. No distress.  HENT:  Head: Normocephalic and atraumatic.  Neck: Normal range of motion. Neck supple.  Cardiovascular: Normal rate, regular rhythm, normal heart sounds and intact distal pulses.   No murmur heard. Pulmonary/Chest: Effort normal and breath sounds normal. No respiratory distress.  Abdominal: Soft. She exhibits no distension. There is no tenderness. There is no guarding.  Musculoskeletal: She exhibits tenderness. She exhibits no edema.       Lumbar back: She exhibits tenderness and pain. She exhibits normal range of motion, no swelling, no deformity, no laceration and normal pulse.       Back:  Focal ttp of the right lower lumbar spine and adjacent right paraspinal muscles.  DP pulses are brisk and symmetrical.  Distal sensation intact.  Pt has 5/5 strength against resistance of bilateral lower extremities.     Neurological: She is alert and oriented to person, place, and time. She has normal strength. No sensory deficit. She exhibits normal muscle tone. Coordination and gait normal.  Reflex Scores:      Patellar reflexes are 2+ on the right side and 2+ on the left side.      Achilles reflexes are 2+ on the right side and 2+ on the left side. Skin: Skin is warm and dry. No rash noted.  Nursing note and vitals reviewed.    ED Treatments / Results  Labs (all labs ordered are listed, but only abnormal results are displayed) Labs Reviewed - No data to display  EKG  EKG Interpretation None       Radiology No results found.  Procedures Procedures (including critical care  time)  Medications Ordered in ED Medications - No data to display   Initial Impression / Assessment and Plan / ED Course  I have reviewed the triage vital signs and the nursing notes.  Pertinent labs & imaging results that were available during my care of the patient were reviewed by me and considered in my medical decision making (see chart for details).     Pt with right sided low back pain that is likely musculoskeletal in origin.  No focal neuro deficits on exam.  Ambulates with a steady gait, NV intact    Pt reviewed on [facility] and received #45 hydrocodone on 01/29/17.     Pain improved here after IM toradol and po robaxin.  She agrees to symptomatic  tx with NSAID and robaxin.  She appears stable for d/c, return precautions discussed.    Final Clinical Impressions(s) / ED Diagnoses   Final diagnoses:  Strain of lumbar region, initial encounter    New Prescriptions New Prescriptions   No medications on file     Kem Parkinson, Hershal Coria 02/07/17 [place], MD 02/08/17 1630

## 2017-02-07 NOTE — ED Triage Notes (Signed)
Back pain x 2 days, no relief with OTC medications, denies injury

## 2017-02-07 NOTE — ED Triage Notes (Signed)
Pt reports lower back pain for 2 days. No known injury. Denies numbness or tingling in legs or loss of continence

## 2017-02-07 NOTE — Discharge Instructions (Signed)
Alternate ice and heat to your back.  Avoid bending or twisting movements for one week.  Follow-up with your primary doctor in one week if not improving

## 2017-02-26 ENCOUNTER — Encounter: Payer: Self-pay | Admitting: Orthopaedic Surgery

## 2017-02-26 ENCOUNTER — Ambulatory Visit (INDEPENDENT_AMBULATORY_CARE_PROVIDER_SITE_OTHER): Payer: BLUE CROSS/BLUE SHIELD | Admitting: Orthopaedic Surgery

## 2017-02-26 VITALS — BP 112/61 | HR 71 | Temp 97.5°F | Ht 64.0 in | Wt 288.0 lb

## 2017-02-26 DIAGNOSIS — M25562 Pain in left knee: Secondary | ICD-10-CM

## 2017-02-26 DIAGNOSIS — G8929 Other chronic pain: Secondary | ICD-10-CM

## 2017-02-26 MED ORDER — HYDROCODONE-ACETAMINOPHEN 5-325 MG PO TABS: 1.0000 | ORAL_TABLET | Freq: Four times a day (QID) | ORAL | 0 refills | Status: DC | PRN

## 2017-02-26 NOTE — Progress Notes (Signed)
CC:  I have pain of my left knee. I would like an injection.  The patient has chronic pain of the left knee.  There is no recent trauma.  There is no redness.  Injections in the past have helped.  The knee has no redness, has an effusion and crepitus present.  ROM of the left knee is 0-105.  Impression:  Chronic knee pain left  Return: 1 month  PROCEDURE NOTE:  The patient requests injections of the left knee, verbal consent was obtained.  The left knee was prepped appropriately after time out was performed.   Sterile technique was observed and injection of 1 cc of Depo-Medrol 40 mg with several cc's of plain xylocaine. Anesthesia was provided by ethyl chloride and a 20-gauge needle was used to inject the knee area. The injection was tolerated well.  A band aid dressing was applied.  The patient was advised to apply ice later today and tomorrow to the injection sight as needed.  I have reviewed the [place] web site prior to prescribing narcotic medicine for this patient.  Electronically Signed Sanjuana Kava, MD 4/17/20188:33 AM

## 2017-03-26 ENCOUNTER — Ambulatory Visit (INDEPENDENT_AMBULATORY_CARE_PROVIDER_SITE_OTHER): Payer: BLUE CROSS/BLUE SHIELD | Admitting: Orthopaedic Surgery

## 2017-03-26 ENCOUNTER — Encounter: Payer: Self-pay | Admitting: Orthopaedic Surgery

## 2017-03-26 VITALS — BP 131/76 | HR 86 | Ht 63.0 in | Wt 282.0 lb

## 2017-03-26 DIAGNOSIS — G8929 Other chronic pain: Secondary | ICD-10-CM | POA: Diagnosis not present

## 2017-03-26 DIAGNOSIS — M25562 Pain in left knee: Secondary | ICD-10-CM | POA: Diagnosis not present

## 2017-03-26 MED ORDER — HYDROCODONE-ACETAMINOPHEN 5-325 MG PO TABS: 1.0000 | ORAL_TABLET | Freq: Four times a day (QID) | ORAL | 0 refills | Status: DC | PRN

## 2017-03-26 NOTE — Progress Notes (Signed)
CC:  I have pain of my left knee. I would like an injection.  The patient has chronic pain of the left knee.  There is no recent trauma.  There is no redness.  Injections in the past have helped.  The knee has no redness, has an effusion and crepitus present.  ROM of the left knee is 0-105.  Impression:  Chronic knee pain left  Return: 1 month  PROCEDURE NOTE:  The patient requests injections of the left knee, verbal consent was obtained.  The left knee was prepped appropriately after time out was performed.   Sterile technique was observed and injection of 1 cc of Depo-Medrol 40 mg with several cc's of plain xylocaine. Anesthesia was provided by ethyl chloride and a 20-gauge needle was used to inject the knee area. The injection was tolerated well.  A band aid dressing was applied.  The patient was advised to apply ice later today and tomorrow to the injection sight as needed.  I have reviewed the [place] web site prior to prescribing narcotic medicine for this patient.  Electronically Signed Sanjuana Kava, MD 5/15/20188:21 AM

## 2017-04-23 ENCOUNTER — Ambulatory Visit (INDEPENDENT_AMBULATORY_CARE_PROVIDER_SITE_OTHER): Payer: BLUE CROSS/BLUE SHIELD | Admitting: Orthopaedic Surgery

## 2017-04-23 ENCOUNTER — Encounter: Payer: Self-pay | Admitting: Orthopaedic Surgery

## 2017-04-23 VITALS — BP 159/89 | HR 69 | Temp 97.7°F | Ht 63.0 in | Wt 280.0 lb

## 2017-04-23 DIAGNOSIS — M25562 Pain in left knee: Secondary | ICD-10-CM

## 2017-04-23 DIAGNOSIS — G8929 Other chronic pain: Secondary | ICD-10-CM

## 2017-04-23 MED ORDER — HYDROCODONE-ACETAMINOPHEN 5-325 MG PO TABS: 1.0000 | ORAL_TABLET | Freq: Four times a day (QID) | ORAL | 0 refills | Status: DC | PRN

## 2017-04-23 NOTE — Progress Notes (Signed)
CC:  I have pain of my left knee. I would like an injection.  The patient has chronic pain of the left knee.  There is no recent trauma.  There is no redness.  Injections in the past have helped.  The knee has no redness, has an effusion and crepitus present.  ROM of the left knee is 0-110.  Impression:  Chronic knee pain left  Return: 1 month  PROCEDURE NOTE:  The patient requests injections of the left knee, verbal consent was obtained.  The left knee was prepped appropriately after time out was performed.   Sterile technique was observed and injection of 1 cc of Depo-Medrol 40 mg with several cc's of plain xylocaine. Anesthesia was provided by ethyl chloride and a 20-gauge needle was used to inject the knee area. The injection was tolerated well.  A band aid dressing was applied.  The patient was advised to apply ice later today and tomorrow to the injection sight as needed.  I have reviewed the [place] web site prior to prescribing narcotic medicine for this patient.  Electronically Signed Sanjuana Kava, MD 6/12/20189:48 AM

## 2017-05-21 ENCOUNTER — Ambulatory Visit: Payer: BLUE CROSS/BLUE SHIELD | Admitting: Orthopaedic Surgery

## 2017-05-22 ENCOUNTER — Ambulatory Visit (INDEPENDENT_AMBULATORY_CARE_PROVIDER_SITE_OTHER): Payer: BLUE CROSS/BLUE SHIELD | Admitting: Orthopaedic Surgery

## 2017-05-22 ENCOUNTER — Encounter: Payer: Self-pay | Admitting: Orthopaedic Surgery

## 2017-05-22 VITALS — BP 124/70 | HR 68 | Temp 97.2°F | Ht 63.0 in | Wt 280.0 lb

## 2017-05-22 DIAGNOSIS — M25562 Pain in left knee: Secondary | ICD-10-CM

## 2017-05-22 DIAGNOSIS — G8929 Other chronic pain: Secondary | ICD-10-CM | POA: Diagnosis not present

## 2017-05-22 MED ORDER — HYDROCODONE-ACETAMINOPHEN 5-325 MG PO TABS: 1.0000 | ORAL_TABLET | Freq: Four times a day (QID) | ORAL | 0 refills | Status: DC | PRN

## 2017-05-22 NOTE — Progress Notes (Signed)
CC:  I have pain of my left knee. I would like an injection.  The patient has chronic pain of the left knee.  There is no recent trauma.  There is no redness.  Injections in the past have helped.  The knee has no redness, has an effusion and crepitus present.  ROM of the left knee is 0-110.  Impression:  Chronic knee pain left  Return: 1 month  PROCEDURE NOTE:  The patient requests injections of the left knee, verbal consent was obtained.  The left knee was prepped appropriately after time out was performed.   Sterile technique was observed and injection of 1 cc of Depo-Medrol 40 mg with several cc's of plain xylocaine. Anesthesia was provided by ethyl chloride and a 20-gauge needle was used to inject the knee area. The injection was tolerated well.  A band aid dressing was applied.  The patient was advised to apply ice later today and tomorrow to the injection sight as needed.  I have reviewed the [place] web site prior to prescribing narcotic medicine for this patient.  Electronically Signed Sanjuana Kava, MD 7/11/20188:17 AM

## 2017-06-19 ENCOUNTER — Encounter: Payer: Self-pay | Admitting: Orthopaedic Surgery

## 2017-06-19 ENCOUNTER — Ambulatory Visit (INDEPENDENT_AMBULATORY_CARE_PROVIDER_SITE_OTHER): Payer: BLUE CROSS/BLUE SHIELD | Admitting: Orthopaedic Surgery

## 2017-06-19 VITALS — BP 139/81 | HR 69 | Temp 97.0°F | Ht 63.0 in | Wt 283.0 lb

## 2017-06-19 DIAGNOSIS — M25562 Pain in left knee: Secondary | ICD-10-CM | POA: Diagnosis not present

## 2017-06-19 DIAGNOSIS — G8929 Other chronic pain: Secondary | ICD-10-CM

## 2017-06-19 MED ORDER — HYDROCODONE-ACETAMINOPHEN 5-325 MG PO TABS: 1.0000 | ORAL_TABLET | Freq: Four times a day (QID) | ORAL | 0 refills | Status: DC | PRN

## 2017-06-19 NOTE — Progress Notes (Signed)
CC:  I have pain of my left knee. I would like an injection.  The patient has chronic pain of the left knee.  There is no recent trauma.  There is no redness.  Injections in the past have helped.  The knee has no redness, has an effusion and crepitus present.  ROM of the left knee is 0-105.  Impression:  Chronic knee pain left  Return: 1 month  PROCEDURE NOTE:  The patient requests injections of the left knee, verbal consent was obtained.  The left knee was prepped appropriately after time out was performed.   Sterile technique was observed and injection of 1 cc of Depo-Medrol 40 mg with several cc's of plain xylocaine. Anesthesia was provided by ethyl chloride and a 20-gauge needle was used to inject the knee area. The injection was tolerated well.  A band aid dressing was applied.  The patient was advised to apply ice later today and tomorrow to the injection sight as needed.  I have reviewed the [place] web site prior to prescribing narcotic medicine for this patient. Electronically Signed Sanjuana Kava, MD 8/8/20188:34 AM

## 2017-07-17 ENCOUNTER — Ambulatory Visit: Payer: BLUE CROSS/BLUE SHIELD | Admitting: Orthopaedic Surgery

## 2017-07-17 ENCOUNTER — Encounter: Payer: Self-pay | Admitting: Orthopaedic Surgery

## 2017-07-18 ENCOUNTER — Ambulatory Visit (INDEPENDENT_AMBULATORY_CARE_PROVIDER_SITE_OTHER): Payer: BLUE CROSS/BLUE SHIELD | Admitting: Orthopaedic Surgery

## 2017-07-18 DIAGNOSIS — G8929 Other chronic pain: Secondary | ICD-10-CM

## 2017-07-18 DIAGNOSIS — M25562 Pain in left knee: Secondary | ICD-10-CM | POA: Diagnosis not present

## 2017-07-18 MED ORDER — HYDROCODONE-ACETAMINOPHEN 5-325 MG PO TABS: 1.0000 | ORAL_TABLET | Freq: Four times a day (QID) | ORAL | 0 refills | Status: DC | PRN

## 2017-07-18 NOTE — Progress Notes (Signed)
CC:  I have pain of my left knee. I would like an injection.  The patient has chronic pain of the left knee.  There is no recent trauma.  There is no redness.  Injections in the past have helped.  The knee has no redness, has an effusion and crepitus present.  ROM of the left knee is 0-110.  Impression:  Chronic knee pain left  Return: 1 month  PROCEDURE NOTE:  The patient requests injections of the left knee, verbal consent was obtained.  The left knee was prepped appropriately after time out was performed.   Sterile technique was observed and injection of 1 cc of Depo-Medrol 40 mg with several cc's of plain xylocaine. Anesthesia was provided by ethyl chloride and a 20-gauge needle was used to inject the knee area. The injection was tolerated well.  A band aid dressing was applied.  The patient was advised to apply ice later today and tomorrow to the injection sight as needed.  I have reviewed the [place] web site prior to prescribing narcotic medicine for this patient.  Electronically Signed Sanjuana Kava, MD 9/6/20188:55 AM

## 2017-07-23 ENCOUNTER — Emergency Department (HOSPITAL_COMMUNITY)
Admission: EM | Admit: 2017-07-23 | Discharge: 2017-07-23 | Disposition: A | Discharge status: Home / Self Care | Payer: BLUE CROSS/BLUE SHIELD | Attending: Emergency Medicine | Admitting: Emergency Medicine

## 2017-07-23 ENCOUNTER — Emergency Department (HOSPITAL_COMMUNITY): Payer: BLUE CROSS/BLUE SHIELD

## 2017-07-23 ENCOUNTER — Encounter (HOSPITAL_COMMUNITY): Payer: Self-pay

## 2017-07-23 DIAGNOSIS — Z79899 Other long term (current) drug therapy: Secondary | ICD-10-CM | POA: Insufficient documentation

## 2017-07-23 DIAGNOSIS — R197 Diarrhea, unspecified: Secondary | ICD-10-CM | POA: Diagnosis present

## 2017-07-23 DIAGNOSIS — J069 Acute upper respiratory infection, unspecified: Secondary | ICD-10-CM | POA: Diagnosis not present

## 2017-07-23 LAB — COMPREHENSIVE METABOLIC PANEL
ALBUMIN: 3.9 g/dL (ref 3.5–5.0)
ALT: 25 U/L (ref 14–54)
ANION GAP: 6 (ref 5–15)
AST: 22 U/L (ref 15–41)
Alkaline Phosphatase: 53 U/L (ref 38–126)
BUN: 10 mg/dL (ref 6–20)
CO2: 29 mmol/L (ref 22–32)
Calcium: 8.8 mg/dL — ABNORMAL LOW (ref 8.9–10.3)
Chloride: 107 mmol/L (ref 101–111)
Creatinine, Ser: 0.62 mg/dL (ref 0.44–1.00)
GFR calc Af Amer: >60 mL/min (ref >60)
GFR calc non Af Amer: >60 mL/min (ref >60)
GLUCOSE: 91 mg/dL (ref 65–99)
POTASSIUM: 4 mmol/L (ref 3.5–5.1)
SODIUM: 142 mmol/L (ref 135–145)
TOTAL PROTEIN: 7 g/dL (ref 6.5–8.1)
Total Bilirubin: 0.2 mg/dL — ABNORMAL LOW (ref 0.3–1.2)

## 2017-07-23 LAB — CBC
HEMATOCRIT: 37.4 % (ref 36.0–46.0)
HEMOGLOBIN: 12 g/dL (ref 12.0–15.0)
MCH: 25.5 pg — AB (ref 26.0–34.0)
MCHC: 32.1 g/dL (ref 30.0–36.0)
MCV: 79.6 fL (ref 78.0–100.0)
Platelets: 174 10*3/uL (ref 150–400)
RBC: 4.7 MIL/uL (ref 3.87–5.11)
RDW: 13.3 % (ref 11.5–15.5)
WBC: 11 10*3/uL — ABNORMAL HIGH (ref 4.0–10.5)

## 2017-07-23 LAB — URINALYSIS, ROUTINE W REFLEX MICROSCOPIC
BILIRUBIN URINE: NEGATIVE
Glucose, UA: NEGATIVE mg/dL
Hgb urine dipstick: NEGATIVE
KETONES UR: NEGATIVE mg/dL
Leukocytes, UA: NEGATIVE
NITRITE: NEGATIVE
PH: 6 (ref 5.0–8.0)
PROTEIN: NEGATIVE mg/dL
SPECIFIC GRAVITY, URINE: 1.009 (ref 1.005–1.030)

## 2017-07-23 LAB — LIPASE, BLOOD: Lipase: 34 U/L (ref 11–51)

## 2017-07-23 MED ORDER — LORATADINE 10 MG PO TABS
10.0000 mg | ORAL_TABLET | Freq: Once | ORAL | Status: AC
  Administered 2017-07-23: 10 mg via ORAL
  Filled 2017-07-23: qty 1

## 2017-07-23 MED ORDER — OXYMETAZOLINE HCL 0.05 % NA SOLN
1.0000 | Freq: Once | NASAL | Status: AC
  Administered 2017-07-23: 1 via NASAL
  Filled 2017-07-23: qty 15

## 2017-07-23 MED ORDER — LORATADINE 10 MG PO TABS: 10.0000 mg | ORAL_TABLET | Freq: Every day | ORAL | 0 refills | Status: DC

## 2017-07-23 MED ORDER — PSEUDOEPHEDRINE HCL 60 MG PO TABS
60.0000 mg | ORAL_TABLET | Freq: Once | ORAL | Status: AC
  Administered 2017-07-23: 60 mg via ORAL
  Filled 2017-07-23: qty 1

## 2017-07-23 NOTE — ED Provider Notes (Signed)
[facility] DEPT Provider Note   CSN: [id number] Arrival date & time: 07/23/17  1030     History   Chief Complaint Chief Complaint  Patient presents with  . Diarrhea  . URI    HPI Erica Greer is a 51 y.o. female.  Pt presents to the ED today with uri sx and small amt of diarrhea.  The pt said sx hit her this morning suddenly.  She has not taken any otc meds.  She also describes a dry cough.  The pt said she's only had 1 episode of diarrhea.  Pt denies any fever, but has had chills.      Past Medical History:  Diagnosis Date  . Ovarian cyst    right     Patient Active Problem List   Diagnosis Date Noted  . Mass of right ovary 09/16/2015    Past Surgical History:  Procedure Laterality Date  . LAPAROSCOPIC SALPINGO OOPHERECTOMY Right 10/04/2015   Procedure: LAPAROSCOPIC RIGHT SALPINGO OOPHORECTOMY;  Surgeon: Jonnie Kind, MD;  Location: [facility];  Service: Gynecology;  Laterality: Right;  . LAPAROSCOPIC UNILATERAL SALPINGECTOMY Left 10/04/2015   Procedure: LAPAROSCOPIC LEFT SALPINGECTOMY;  Surgeon: Jonnie Kind, MD;  Location: [facility];  Service: Gynecology;  Laterality: Left;  . OVARIAN CYST REMOVAL Left   . TUBAL LIGATION Left    Unilateral    OB History    Gravida Para Term Preterm AB Living   5 5 5     5    SAB TAB Ectopic Multiple Live Births                   Home Medications    Prior to Admission medications   Medication Sig Start Date End Date Taking? Authorizing Provider  acetaminophen (TYLENOL) 500 MG tablet Take 500 mg by mouth 2 (two) times daily as needed for moderate pain. Reported on 03/01/2016   Yes [provider]  HYDROcodone-acetaminophen (NORCO/VICODIN) 5-325 MG tablet Take 1 tablet by mouth every 6 (six) hours as needed for moderate pain (Must last15 days.Do not take and drive a car or use machinery.). 07/18/17  Yes Sanjuana Kava, MD  Pseudoeph-Chlorphen-DM (CHILDRENS NYQUIL COLD/COUGH PO) Take 30 mLs by mouth daily as  needed (cold).   Yes [provider]  diclofenac (VOLTAREN) 75 MG EC tablet Take 1 tablet (75 mg total) by mouth 2 (two) times daily. Take with food Patient not taking: Reported on 07/23/2017 02/07/17   Triplett, Tammy, PA-C  loratadine (CLARITIN) 10 MG tablet Take 1 tablet (10 mg total) by mouth daily. 07/23/17   Isla Pence, MD    Family History Family History  Problem Relation Age of Onset  . Diabetes Mother   . Cancer Father   . Seizures Sister     Social History Social History  Substance Use Topics  . Smoking status: Never Smoker  . Smokeless tobacco: Never Used  . Alcohol use No     Allergies   Hydrocodone   Review of Systems Review of Systems  HENT: Positive for postnasal drip and sinus pressure.   Respiratory: Positive for cough.   Gastrointestinal: Positive for diarrhea.  All other systems reviewed and are negative.    Physical Exam Updated Vital Signs BP (!) 123/55 (BP Location: Right Arm)   Pulse 72   Temp 98.3 F (36.8 C) (Oral)   Resp 18   Ht 5\' 4"  (1.626 m)   Wt 129.7 kg (286 lb)   LMP 02/11/2015   SpO2 100%  BMI 49.09 kg/m   Physical Exam  Constitutional: She appears well-developed and well-nourished.  HENT:  Head: Normocephalic and atraumatic.  Right Ear: External ear normal.  Left Ear: External ear normal.  Nose: Mucosal edema and rhinorrhea present.  Mouth/Throat: Oropharynx is clear and moist.  Eyes: Pupils are equal, round, and reactive to light. Conjunctivae and EOM are normal.  Neck: Normal range of motion. Neck supple.  Cardiovascular: Normal rate, regular rhythm, normal heart sounds and intact distal pulses.   Pulmonary/Chest: Effort normal and breath sounds normal.  Abdominal: Soft. Bowel sounds are normal.  Musculoskeletal: Normal range of motion.  Neurological: She is alert.  Skin: Skin is warm.  Psychiatric: She has a normal mood and affect. Her behavior is normal. Judgment and thought content normal.  Nursing  note and vitals reviewed.    ED Treatments / Results  Labs (all labs ordered are listed, but only abnormal results are displayed) Labs Reviewed  COMPREHENSIVE METABOLIC PANEL - Abnormal; Notable for the following:       Result Value   Calcium 8.8 (*)    Total Bilirubin 0.2 (*)    All other components within normal limits  CBC - Abnormal; Notable for the following:    WBC 11.0 (*)    MCH 25.5 (*)    All other components within normal limits  URINALYSIS, ROUTINE W REFLEX MICROSCOPIC - Abnormal; Notable for the following:    Color, Urine STRAW (*)    All other components within normal limits  LIPASE, BLOOD    EKG  EKG Interpretation None       Radiology Dg Chest 2 View  Result Date: 07/23/2017 CLINICAL DATA:  Cough EXAM: CHEST  2 VIEW COMPARISON:  04/13/2009 FINDINGS: The heart size and mediastinal contours are within normal limits. Both lungs are clear. The visualized skeletal structures are unremarkable. IMPRESSION: No active cardiopulmonary disease. Electronically Signed   By: Franchot Gallo M.D.   On: 07/23/2017 14:54    Procedures Procedures (including critical care time)  Medications Ordered in ED Medications  loratadine (CLARITIN) tablet 10 mg (10 mg Oral Given 07/23/17 1457)  oxymetazoline (AFRIN) 0.05 % nasal spray 1 spray (1 spray Each Nare Given 07/23/17 1457)  pseudoephedrine (SUDAFED) tablet 60 mg (60 mg Oral Given 07/23/17 1457)     Initial Impression / Assessment and Plan / ED Course  I have reviewed the triage vital signs and the nursing notes.  Pertinent labs & imaging results that were available during my care of the patient were reviewed by me and considered in my medical decision making (see chart for details).     Pt instructed to take otc sudafed and saline sinus spray.  She is given a note for work.  She knows to return if worse.  F/u with pcp.  Final Clinical Impressions(s) / ED Diagnoses   Final diagnoses:  Viral upper respiratory tract  infection    New Prescriptions Discharge Medication List as of 07/23/2017  3:21 PM    START taking these medications   Details  loratadine (CLARITIN) 10 MG tablet Take 1 tablet (10 mg total) by mouth daily., Starting Tue 07/23/2017, Print         Isla Pence, MD 07/23/17 1531

## 2017-07-23 NOTE — ED Notes (Signed)
Patient given discharge instruction, verbalized understand. Patient ambulatory out of the department.  

## 2017-07-23 NOTE — ED Notes (Signed)
No diarrhea since waiting, congestion and drainage continues

## 2017-07-23 NOTE — ED Triage Notes (Signed)
Reports of runny nose, cough and diarrhea since yesterday. Denies fever or pain.

## 2017-07-23 NOTE — Discharge Instructions (Signed)
OTC sudafed and saline spray

## 2017-08-15 ENCOUNTER — Ambulatory Visit (INDEPENDENT_AMBULATORY_CARE_PROVIDER_SITE_OTHER): Payer: BLUE CROSS/BLUE SHIELD | Admitting: Orthopaedic Surgery

## 2017-08-15 ENCOUNTER — Encounter: Payer: Self-pay | Admitting: Orthopaedic Surgery

## 2017-08-15 VITALS — BP 137/84 | HR 64 | Temp 97.3°F | Ht 63.0 in | Wt 283.0 lb

## 2017-08-15 DIAGNOSIS — G8929 Other chronic pain: Secondary | ICD-10-CM | POA: Diagnosis not present

## 2017-08-15 DIAGNOSIS — M25562 Pain in left knee: Secondary | ICD-10-CM

## 2017-08-15 MED ORDER — HYDROCODONE-ACETAMINOPHEN 5-325 MG PO TABS: 1.0000 | ORAL_TABLET | Freq: Four times a day (QID) | ORAL | 0 refills | Status: DC | PRN

## 2017-08-15 NOTE — Progress Notes (Signed)
CC:  I have pain of my left knee. I would like an injection.  The patient has chronic pain of the left knee.  There is no recent trauma.  There is no redness.  Injections in the past have helped.  The knee has no redness, has an effusion and crepitus present.  ROM of the left knee is 0-110.  Impression:  Chronic knee pain left  Return: 1 month  PROCEDURE NOTE:  The patient requests injections of the left knee, verbal consent was obtained.  The left knee was prepped appropriately after time out was performed.   Sterile technique was observed and injection of 1 cc of Depo-Medrol 40 mg with several cc's of plain xylocaine. Anesthesia was provided by ethyl chloride and a 20-gauge needle was used to inject the knee area. The injection was tolerated well.  A band aid dressing was applied.  The patient was advised to apply ice later today and tomorrow to the injection sight as needed.  I have reviewed the [place] web site prior to prescribing narcotic medicine for this patient.  ,Electronically Signed Sanjuana Kava, MD 10/4/20188:38 AM

## 2017-09-12 ENCOUNTER — Encounter: Payer: Self-pay | Admitting: Orthopaedic Surgery

## 2017-09-12 ENCOUNTER — Ambulatory Visit (INDEPENDENT_AMBULATORY_CARE_PROVIDER_SITE_OTHER): Payer: BLUE CROSS/BLUE SHIELD | Admitting: Orthopaedic Surgery

## 2017-09-12 DIAGNOSIS — M25562 Pain in left knee: Secondary | ICD-10-CM | POA: Diagnosis not present

## 2017-09-12 DIAGNOSIS — G8929 Other chronic pain: Secondary | ICD-10-CM

## 2017-09-12 MED ORDER — HYDROCODONE-ACETAMINOPHEN 5-325 MG PO TABS: 1.0000 | ORAL_TABLET | Freq: Four times a day (QID) | ORAL | 0 refills | Status: DC | PRN

## 2017-09-12 NOTE — Progress Notes (Signed)
CC:  I have pain of my left knee. I would like an injection.  The patient has chronic pain of the left knee.  There is no recent trauma.  There is no redness.  Injections in the past have helped.  The knee has no redness, has an effusion and crepitus present.  ROM of the left knee is 0-105.  Impression:  Chronic knee pain left  Return: 1 month  PROCEDURE NOTE:  The patient requests injections of the left knee, verbal consent was obtained.  The left knee was prepped appropriately after time out was performed.   Sterile technique was observed and injection of 1 cc of Depo-Medrol 40 mg with several cc's of plain xylocaine. Anesthesia was provided by ethyl chloride and a 20-gauge needle was used to inject the knee area. The injection was tolerated well.  A band aid dressing was applied.  The patient was advised to apply ice later today and tomorrow to the injection sight as needed.  I have reviewed the [place] web site prior to prescribing narcotic medicine for this patient.  Electronically [place], MD 11/1/20188:16 AM

## 2017-10-10 ENCOUNTER — Ambulatory Visit: Payer: BLUE CROSS/BLUE SHIELD | Admitting: Orthopaedic Surgery

## 2017-10-19 ENCOUNTER — Emergency Department (HOSPITAL_COMMUNITY)
Admission: EM | Admit: 2017-10-19 | Discharge: 2017-10-19 | Disposition: A | Discharge status: Home / Self Care | Payer: BLUE CROSS/BLUE SHIELD | Attending: Emergency Medicine | Admitting: Emergency Medicine

## 2017-10-19 ENCOUNTER — Emergency Department (HOSPITAL_COMMUNITY): Payer: BLUE CROSS/BLUE SHIELD

## 2017-10-19 ENCOUNTER — Encounter (HOSPITAL_COMMUNITY): Payer: Self-pay | Admitting: Emergency Medicine

## 2017-10-19 ENCOUNTER — Other Ambulatory Visit: Payer: Self-pay

## 2017-10-19 DIAGNOSIS — Z79899 Other long term (current) drug therapy: Secondary | ICD-10-CM | POA: Diagnosis not present

## 2017-10-19 DIAGNOSIS — J069 Acute upper respiratory infection, unspecified: Secondary | ICD-10-CM | POA: Diagnosis not present

## 2017-10-19 DIAGNOSIS — R0981 Nasal congestion: Secondary | ICD-10-CM | POA: Diagnosis present

## 2017-10-19 HISTORY — DX: Pain in unspecified knee: M25.569

## 2017-10-19 HISTORY — DX: Other chronic pain: G89.29

## 2017-10-19 MED ORDER — FLUTICASONE PROPIONATE 50 MCG/ACT NA SUSP: 2.0000 | Freq: Every day | NASAL | 0 refills | Status: DC

## 2017-10-19 NOTE — ED Triage Notes (Signed)
Pt c/o of congestion and dry cough since yesterday. No fever.

## 2017-10-19 NOTE — ED Provider Notes (Signed)
[facility] EMERGENCY DEPARTMENT Provider Note   CSN: [id number] Arrival date & time: 10/19/17  5329     History   Chief Complaint Chief Complaint  Patient presents with  . Nasal Congestion    HPI Erica Greer is a 51 y.o. female.  HPI  Pt was seen at 0920.  Per pt, c/o gradual onset and persistence of constant runny/stuffy nose, sinus congestion, and cough for the past 2 days.  Denies fevers, no rash, no CP/SOB, no N/V/D, no abd pain.    Past Medical History:  Diagnosis Date  . Chronic knee pain   . Ovarian cyst    right     Patient Active Problem List   Diagnosis Date Noted  . Mass of right ovary 09/16/2015    Past Surgical History:  Procedure Laterality Date  . LAPAROSCOPIC SALPINGO OOPHERECTOMY Right 10/04/2015   Procedure: LAPAROSCOPIC RIGHT SALPINGO OOPHORECTOMY;  Surgeon: Jonnie Kind, MD;  Location: [facility];  Service: Gynecology;  Laterality: Right;  . LAPAROSCOPIC UNILATERAL SALPINGECTOMY Left 10/04/2015   Procedure: LAPAROSCOPIC LEFT SALPINGECTOMY;  Surgeon: Jonnie Kind, MD;  Location: [facility];  Service: Gynecology;  Laterality: Left;  . OVARIAN CYST REMOVAL Left   . TUBAL LIGATION Left    Unilateral    OB History    Gravida Para Term Preterm AB Living   5 5 5     5    SAB TAB Ectopic Multiple Live Births                   Home Medications    Prior to Admission medications   Medication Sig Start Date End Date Taking? Authorizing Provider  acetaminophen (TYLENOL) 500 MG tablet Take 500 mg by mouth 2 (two) times daily as needed for moderate pain. Reported on 03/01/2016    [provider]  diclofenac (VOLTAREN) 75 MG EC tablet Take 1 tablet (75 mg total) by mouth 2 (two) times daily. Take with food Patient not taking: Reported on 07/23/2017 02/07/17   Kem Parkinson, PA-C  HYDROcodone-acetaminophen (NORCO/VICODIN) 5-325 MG tablet Take 1 tablet by mouth every 6 (six) hours as needed for moderate pain (Must last15 days.Do not take  and drive a car or use machinery.). 09/12/17   Sanjuana Kava, MD  loratadine (CLARITIN) 10 MG tablet Take 1 tablet (10 mg total) by mouth daily. 07/23/17   Isla Pence, MD    Family History Family History  Problem Relation Age of Onset  . Diabetes Mother   . Cancer Father   . Seizures Sister     Social History Social History   Tobacco Use  . Smoking status: Never Smoker  . Smokeless tobacco: Never Used  Substance Use Topics  . Alcohol use: No  . Drug use: No     Allergies   Hydrocodone   Review of Systems Review of Systems ROS: Statement: All systems negative except as marked or noted in the HPI; Constitutional: Negative for fever and chills. ; ; Eyes: Negative for eye pain, redness and discharge. ; ; ENMT: Negative for ear pain, hoarseness, sore throat. +nasal congestion, sinus pressure and rhinorrhea. ; ; Cardiovascular: Negative for chest pain, palpitations, diaphoresis, dyspnea and peripheral edema. ; ; Respiratory: +cough. Negative for wheezing and stridor. ; ; Gastrointestinal: Negative for nausea, vomiting, diarrhea, abdominal pain, blood in stool, hematemesis, jaundice and rectal bleeding. . ; ; Genitourinary: Negative for dysuria, flank pain and hematuria. ; ; Musculoskeletal: Negative for back pain and neck pain. Negative for swelling  and trauma.; ; Skin: Negative for pruritus, rash, abrasions, blisters, bruising and skin lesion.; ; Neuro: Negative for headache, lightheadedness and neck stiffness. Negative for weakness, altered level of consciousness, altered mental status, extremity weakness, paresthesias, involuntary movement, seizure and syncope.       Physical Exam Updated Vital Signs BP (!) 109/49   Pulse 77   Temp (!) 97.5 F (36.4 C) (Oral)   Resp 18   Ht 5\' 4"  (1.626 m)   Wt 125.2 kg (276 lb)   LMP 02/11/2015   SpO2 97%   BMI 47.38 kg/m   Physical Exam 0925: Physical examination:  Nursing notes reviewed; Vital signs and O2 SAT reviewed;   Constitutional: Well developed, Well nourished, Well hydrated, In no acute distress; Head:  Normocephalic, atraumatic; Eyes: EOMI, PERRL, No scleral icterus; ENMT: TM's clear bilat. +edemetous nasal turbinates bilat with clear rhinorrhea. Mouth and pharynx normal, Mucous membranes moist; Neck: Supple, Full range of motion, No lymphadenopathy; Cardiovascular: Regular rate and rhythm, No gallop; Respiratory: Breath sounds clear & equal bilaterally, No wheezes.  Speaking full sentences with ease, Normal respiratory effort/excursion; Chest: Nontender, Movement normal; Abdomen: Soft, Nontender, Nondistended, Normal bowel sounds; Genitourinary: No CVA tenderness; Extremities: Pulses normal, No tenderness, No edema, No calf edema or asymmetry.; Neuro: AA&Ox3, Major CN grossly intact.  Speech clear. No gross focal motor or sensory deficits in extremities.; Skin: Color normal, Warm, Dry.   ED Treatments / Results  Labs (all labs ordered are listed, but only abnormal results are displayed)   EKG  EKG Interpretation None       Radiology   Procedures Procedures (including critical care time)  Medications Ordered in ED Medications - No data to display   Initial Impression / Assessment and Plan / ED Course  I have reviewed the triage vital signs and the nursing notes.  Pertinent labs & imaging results that were available during my care of the patient were reviewed by me and considered in my medical decision making (see chart for details).  MDM Reviewed: previous chart, nursing note and vitals Interpretation: x-ray   Dg Chest 2 View Result Date: 10/19/2017 CLINICAL DATA:  Sinus congestion and cough since yesterday. EXAM: CHEST  2 VIEW COMPARISON:  07/23/2017 FINDINGS: The heart size and mediastinal contours are within normal limits. Both lungs are clear. No pleural effusion or pneumothorax. The visualized skeletal structures are unremarkable. IMPRESSION: No active cardiopulmonary disease.  Electronically Signed   By: Lajean Manes M.D.   On: 10/19/2017 10:24    1055:  CXR reassuring. Tx symptomatically at this time. Dx and testing d/w pt.  Questions answered.  Verb understanding, agreeable to d/c home with outpt f/u.   Final Clinical Impressions(s) / ED Diagnoses   Final diagnoses:  None    ED Discharge Orders    None       Francine Graven, DO 10/23/17 1950

## 2017-10-19 NOTE — Discharge Instructions (Signed)
Take over the counter decongestant (such as sudafed), as directed on packaging, for the next week.  Use over the counter normal saline nasal spray with frequent nose blowing, as instructed in the Emergency Department, several times per day for the next 2 weeks. Take the prescription as directed for the next 2 weeks.  Call your regular medical doctor on Monday to schedule a follow up appointment this week.  Return to the Emergency Department immediately if worsening.

## 2017-11-19 ENCOUNTER — Encounter: Payer: Self-pay | Admitting: Orthopaedic Surgery

## 2017-11-19 ENCOUNTER — Ambulatory Visit: Payer: BLUE CROSS/BLUE SHIELD | Admitting: Orthopaedic Surgery

## 2017-11-19 VITALS — Ht 63.0 in | Wt 283.0 lb

## 2017-11-19 DIAGNOSIS — M25562 Pain in left knee: Secondary | ICD-10-CM

## 2017-11-19 DIAGNOSIS — G8929 Other chronic pain: Secondary | ICD-10-CM

## 2017-11-19 MED ORDER — HYDROCODONE-ACETAMINOPHEN 5-325 MG PO TABS: 1.0000 | ORAL_TABLET | Freq: Four times a day (QID) | ORAL | 0 refills | Status: DC | PRN

## 2017-11-19 NOTE — Progress Notes (Signed)
CC:  I have pain of my left knee. I would like an injection.  The patient has chronic pain of the left knee.  There is no recent trauma.  There is no redness.  Injections in the past have helped.  The knee has no redness, has an effusion and crepitus present.  ROM of the left knee is 0-110.  Impression:  Chronic knee pain left  Return: 1 month  PROCEDURE NOTE:  The patient requests injections of the left knee, verbal consent was obtained.  The left knee was prepped appropriately after time out was performed.   Sterile technique was observed and injection of 1 cc of Depo-Medrol 40 mg with several cc's of plain xylocaine. Anesthesia was provided by ethyl chloride and a 20-gauge needle was used to inject the knee area. The injection was tolerated well.  A band aid dressing was applied.  The patient was advised to apply ice later today and tomorrow to the injection sight as needed.  I have reviewed the [place] web site prior to prescribing narcotic medicine for this patient.  Electronically Signed Sanjuana Kava, MD 1/8/20198:22 AM

## 2017-12-17 ENCOUNTER — Encounter: Payer: Self-pay | Admitting: Orthopaedic Surgery

## 2017-12-17 ENCOUNTER — Ambulatory Visit: Payer: BLUE CROSS/BLUE SHIELD | Admitting: Orthopaedic Surgery

## 2017-12-17 VITALS — BP 133/82 | HR 62 | Temp 97.2°F | Ht 63.0 in | Wt 279.0 lb

## 2017-12-17 DIAGNOSIS — G8929 Other chronic pain: Secondary | ICD-10-CM

## 2017-12-17 DIAGNOSIS — M25562 Pain in left knee: Secondary | ICD-10-CM

## 2017-12-17 MED ORDER — HYDROCODONE-ACETAMINOPHEN 5-325 MG PO TABS: ORAL_TABLET | ORAL | 0 refills | Status: DC

## 2017-12-17 NOTE — Progress Notes (Signed)
CC:  I have pain of my left knee. I would like an injection.  The patient has chronic pain of the left knee.  There is no recent trauma.  There is no redness.  Injections in the past have helped.  The knee has no redness, has an effusion and crepitus present.  ROM of the left knee is 0-110.  Impression:  Chronic knee pain left  Return: 6 weeks  PROCEDURE NOTE:  The patient requests injections of the left knee, verbal consent was obtained.  The left knee was prepped appropriately after time out was performed.   Sterile technique was observed and injection of 1 cc of Depo-Medrol 40 mg with several cc's of plain xylocaine. Anesthesia was provided by ethyl chloride and a 20-gauge needle was used to inject the knee area. The injection was tolerated well.  A band aid dressing was applied.  The patient was advised to apply ice later today and tomorrow to the injection sight as needed.  I have reviewed the [place] web site prior to prescribing narcotic medicine for this patient.  Electronically Signed Sanjuana Kava, MD 2/5/20198:40 AM

## 2018-01-29 ENCOUNTER — Ambulatory Visit: Payer: BLUE CROSS/BLUE SHIELD | Admitting: Orthopedic Surgery

## 2018-11-03 ENCOUNTER — Emergency Department (HOSPITAL_COMMUNITY)
Admission: EM | Admit: 2018-11-03 | Discharge: 2018-11-03 | Disposition: A | Discharge status: Home / Self Care | Payer: BLUE CROSS/BLUE SHIELD | Attending: Emergency Medicine | Admitting: Emergency Medicine

## 2018-11-03 ENCOUNTER — Other Ambulatory Visit: Payer: Self-pay

## 2018-11-03 ENCOUNTER — Encounter (HOSPITAL_COMMUNITY): Payer: Self-pay | Admitting: *Deleted

## 2018-11-03 DIAGNOSIS — R197 Diarrhea, unspecified: Secondary | ICD-10-CM | POA: Diagnosis present

## 2018-11-03 MED ORDER — ONDANSETRON 8 MG PO TBDP
8.0000 mg | ORAL_TABLET | Freq: Once | ORAL | Status: AC
  Administered 2018-11-03: 8 mg via ORAL
  Filled 2018-11-03: qty 1

## 2018-11-03 NOTE — ED Triage Notes (Signed)
Pt c/o diarrhea that started yesterday with chills, generalized body aches,

## 2018-11-03 NOTE — ED Provider Notes (Signed)
[facility] EMERGENCY DEPARTMENT Provider Note   CSN: [id number] Arrival date & time: 11/03/18  0737     History   Chief Complaint Chief Complaint  Patient presents with  . Diarrhea    HPI Erica Greer is a 52 y.o. female.  The history is provided by the patient.  Diarrhea   This is a new problem. The current episode started yesterday. The problem has been gradually worsening. The stool consistency is described as watery. There has been no fever. Associated symptoms include chills, sweats and cough. Pertinent negatives include no vomiting. Treatments tried: OTC meds. The treatment provided no relief. Risk factors include ill contacts.   Patient reports she has had multiple episodes of nonbloody diarrhea for the past day.  No vomiting.  She has mild abdominal cramping with diarrhea only.  Minimal cough, but does report nasal congestion.  No fevers.  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  She works at [employer] Past Medical History:  Diagnosis Date  . Chronic knee pain   . Ovarian cyst    right     Patient Active Problem List   Diagnosis Date Noted  . Mass of right ovary 09/16/2015    Past Surgical History:  Procedure Laterality Date  . LAPAROSCOPIC SALPINGO OOPHERECTOMY Right 10/04/2015   Procedure: LAPAROSCOPIC RIGHT SALPINGO OOPHORECTOMY;  Surgeon: Jonnie Kind, MD;  Location: [facility];  Service: Gynecology;  Laterality: Right;  . LAPAROSCOPIC UNILATERAL SALPINGECTOMY Left 10/04/2015   Procedure: LAPAROSCOPIC LEFT SALPINGECTOMY;  Surgeon: Jonnie Kind, MD;  Location: [facility];  Service: Gynecology;  Laterality: Left;  . OVARIAN CYST REMOVAL Left   . TUBAL LIGATION Left    Unilateral     OB History    Gravida  5   Para  5   Term  5   Preterm      AB      Living  5     SAB      TAB      Ectopic      Multiple      Live Births               Home Medications    Prior to Admission  medications   Medication Sig Start Date End Date Taking? Authorizing Provider  acetaminophen (TYLENOL) 500 MG tablet Take 500 mg by mouth 2 (two) times daily as needed for moderate pain. Reported on 03/01/2016    [provider]  fluticasone (FLONASE) 50 MCG/ACT nasal spray Place 2 sprays into both nostrils daily. 10/19/17   Francine Graven, DO  HYDROcodone-acetaminophen (NORCO/VICODIN) 5-325 MG tablet Take one tablet every six hours for pain. 12/17/17   Sanjuana Kava, MD  loratadine (CLARITIN) 10 MG tablet Take 1 tablet (10 mg total) by mouth daily. Patient taking differently: Take 10 mg by mouth daily as needed for allergies.  07/23/17   Isla Pence, MD  Pseudoeph-Doxylamine-DM-APAP (NYQUIL PO) Take 30 mLs by mouth daily as needed (cold).    [provider]    Family History Family History  Problem Relation Age of Onset  . Diabetes Mother   . Cancer Father   . Seizures Sister     Social History Social History   Tobacco Use  . Smoking status: Never Smoker  . Smokeless tobacco: Never Used  Substance Use Topics  . Alcohol use: No  . Drug use: No     Allergies   Hydrocodone  Review of Systems Review of Systems  Constitutional: Positive for chills.  Respiratory: Positive for cough.   Gastrointestinal: Positive for diarrhea. Negative for vomiting.  All other systems reviewed and are negative.    Physical Exam Updated Vital Signs BP (!) 151/75   Pulse 73   Temp 98.2 F (36.8 C) (Oral)   Resp 20   Ht 1.626 m (5\' 4" )   Wt 127 kg   LMP 02/11/2015   SpO2 98%   BMI 48.06 kg/m   Physical Exam  CONSTITUTIONAL: Well developed/well nourished HEAD: Normocephalic/atraumatic EYES: EOMI/PERRL ENMT: Mucous membranes moist NECK: supple no meningeal signs SPINE/BACK:entire spine nontender CV: S1/S2 noted, no murmurs/rubs/gallops noted LUNGS: Lungs are clear to auscultation bilaterally, no apparent distress ABDOMEN: soft, nontender, no rebound or  guarding, bowel sounds noted throughout abdomen GU:no cva tenderness NEURO: Pt is awake/alert/appropriate, moves all extremitiesx4.  No facial droop.   EXTREMITIES: pulses normal/equal, full ROM SKIN: warm, color normal PSYCH: no abnormalities of mood noted, alert and oriented to situation  ED Treatments / Results  Labs (all labs ordered are listed, but only abnormal results are displayed) Labs Reviewed - No data to display  EKG None  Radiology No results found.  Procedures Procedures (including critical care time)  Medications Ordered in ED Medications  ondansetron (ZOFRAN-ODT) disintegrating tablet 8 mg (8 mg Oral Given 11/03/18 0535)     Initial Impression / Assessment and Plan / ED Course  I have reviewed the triage vital signs and the nursing notes.       Patient is awake alert and well-appearing.  She appears well-hydrated.  Strong suspicion for viral illness causing the diarrhea.  No focal abdominal tenderness. Offered patient further evaluation and treatment in the ED, but she declines and would like to go home.  She will need to be out of work for several days as she does work in Production designer, theatre/television/film  Final Clinical Impressions(s) / ED Diagnoses   Final diagnoses:  Diarrhea of presumed infectious origin    ED Discharge Orders    None       Ripley Fraise, MD 11/03/18 [phone]

## 2020-03-03 ENCOUNTER — Ambulatory Visit: Payer: Self-pay | Admitting: Orthopaedic Surgery

## 2020-03-03 ENCOUNTER — Other Ambulatory Visit: Payer: Self-pay

## 2020-03-03 ENCOUNTER — Encounter: Payer: Self-pay | Admitting: Orthopaedic Surgery

## 2020-03-03 VITALS — BP 143/76 | HR 68 | Ht 64.0 in | Wt 286.0 lb

## 2020-03-03 DIAGNOSIS — G8929 Other chronic pain: Secondary | ICD-10-CM

## 2020-03-03 DIAGNOSIS — M25562 Pain in left knee: Secondary | ICD-10-CM

## 2020-03-03 DIAGNOSIS — Z6841 Body Mass Index (BMI) 40.0 and over, adult: Secondary | ICD-10-CM

## 2020-03-03 MED ORDER — HYDROCODONE-ACETAMINOPHEN 5-325 MG PO TABS: ORAL_TABLET | ORAL | 0 refills | Status: DC

## 2020-03-03 NOTE — Progress Notes (Signed)
PROCEDURE NOTE:  The patient requests injections of the left knee , verbal consent was obtained.  The left knee was prepped appropriately after time out was performed.   Sterile technique was observed and injection of 1 cc of Depo-Medrol 40 mg with several cc's of plain xylocaine. Anesthesia was provided by ethyl chloride and a 20-gauge needle was used to inject the knee area. The injection was tolerated well.  A band aid dressing was applied.  The patient was advised to apply ice later today and tomorrow to the injection sight as needed.  I have reviewed the [place] web site prior to prescribing narcotic medicine for this patient.   Electronically Signed Sanjuana Kava, MD 4/22/20211:49 PM

## 2020-04-14 ENCOUNTER — Ambulatory Visit: Payer: 59 | Admitting: Orthopaedic Surgery

## 2020-04-14 ENCOUNTER — Other Ambulatory Visit: Payer: Self-pay

## 2020-04-14 ENCOUNTER — Encounter: Payer: Self-pay | Admitting: Orthopaedic Surgery

## 2020-04-14 DIAGNOSIS — Z6841 Body Mass Index (BMI) 40.0 and over, adult: Secondary | ICD-10-CM

## 2020-04-14 DIAGNOSIS — M25562 Pain in left knee: Secondary | ICD-10-CM | POA: Diagnosis not present

## 2020-04-14 DIAGNOSIS — G8929 Other chronic pain: Secondary | ICD-10-CM

## 2020-04-14 MED ORDER — HYDROCODONE-ACETAMINOPHEN 5-325 MG PO TABS: ORAL_TABLET | ORAL | 0 refills | Status: DC

## 2020-04-14 NOTE — Progress Notes (Signed)
PROCEDURE NOTE:  The patient requests injections of the left knee , verbal consent was obtained.  The left knee was prepped appropriately after time out was performed.   Sterile technique was observed and injection of 1 cc of Depo-Medrol 40 mg with several cc's of plain xylocaine. Anesthesia was provided by ethyl chloride and a 20-gauge needle was used to inject the knee area. The injection was tolerated well.  A band aid dressing was applied.  The patient was advised to apply ice later today and tomorrow to the injection sight as needed.  I will see as needed.  She is going to the beach tomorrow to celebrate her grandson;s 13th birthday.  I have reviewed the [place] web site prior to prescribing narcotic medicine for this patient.   Electronically Signed Sanjuana Kava, MD 6/3/20219:29 AM

## 2020-10-28 ENCOUNTER — Emergency Department (HOSPITAL_COMMUNITY)
Admission: EM | Admit: 2020-10-28 | Discharge: 2020-10-29 | Disposition: A | Discharge status: Home / Self Care | Payer: 59 | Attending: Emergency Medicine | Admitting: Emergency Medicine

## 2020-10-28 ENCOUNTER — Other Ambulatory Visit: Payer: Self-pay

## 2020-10-28 ENCOUNTER — Encounter (HOSPITAL_COMMUNITY): Payer: Self-pay | Admitting: Emergency Medicine

## 2020-10-28 DIAGNOSIS — M5431 Sciatica, right side: Secondary | ICD-10-CM | POA: Insufficient documentation

## 2020-10-28 DIAGNOSIS — M79651 Pain in right thigh: Secondary | ICD-10-CM | POA: Diagnosis present

## 2020-10-28 NOTE — ED Triage Notes (Signed)
Pt c/o right back pain that radiates down into her buttocks x1 month.

## 2020-10-29 MED ORDER — PREDNISONE 20 MG PO TABS: 40.0000 mg | ORAL_TABLET | Freq: Every day | ORAL | 0 refills | Status: AC

## 2020-10-29 MED ORDER — PREDNISONE 50 MG PO TABS
60.0000 mg | ORAL_TABLET | Freq: Once | ORAL | Status: AC
  Administered 2020-10-29: 01:00:00 60 mg via ORAL
  Filled 2020-10-29: qty 1

## 2020-10-29 NOTE — Discharge Instructions (Addendum)
You were evaluated in the Emergency Department and after careful evaluation, we did not find any emergent condition requiring admission or further testing in the hospital.  Your exam/testing today was overall reassuring.  Take the prednisone as directe  Please return to the Emergency Department if you experience any worsening of your condition.  Thank you for allowing [place] to be a part of your care.

## 2020-10-29 NOTE — ED Provider Notes (Signed)
[facility] Emergency Department Provider Note MRN:  [id number]  Arrival date & time: 10/29/20     Chief Complaint   Sciatica   History of Present Illness   Erica Greer is a 54 y.o. year-old female with no pertinent past medical presenting to the ED with chief complaint of sciatica.  Right buttocks pain with radiation down the back of the right leg.  Present for 1 month but worsening over the past few days.  Having trouble standing during the day at her job.  Here for help with the pain.  Denies numbness or weakness to the arms or legs, no issues with urination or bowel movements.  No fever, no burning with urination.  Review of Systems  A complete 10 system review of systems was obtained and all systems are negative except as noted in the HPI and PMH.   Patient's Health History    Past Medical History:  Diagnosis Date  . Chronic knee pain   . Ovarian cyst    right     Past Surgical History:  Procedure Laterality Date  . LAPAROSCOPIC SALPINGO OOPHERECTOMY Right 10/04/2015   Procedure: LAPAROSCOPIC RIGHT SALPINGO OOPHORECTOMY;  Surgeon: Jonnie Kind, MD;  Location: [facility];  Service: Gynecology;  Laterality: Right;  . LAPAROSCOPIC UNILATERAL SALPINGECTOMY Left 10/04/2015   Procedure: LAPAROSCOPIC LEFT SALPINGECTOMY;  Surgeon: Jonnie Kind, MD;  Location: [facility];  Service: Gynecology;  Laterality: Left;  . OVARIAN CYST REMOVAL Left   . TUBAL LIGATION Left    Unilateral    Family History  Problem Relation Age of Onset  . Diabetes Mother   . Cancer Father   . Seizures Sister     Social History   Socioeconomic History  . Marital status: Legally Separated    Spouse name: Not on file  . Number of children: Not on file  . Years of education: Not on file  . Highest education level: Not on file  Occupational History  . Not on file  Tobacco Use  . Smoking status: Never Smoker  . Smokeless tobacco: Never Used  Substance and Sexual Activity   . Alcohol use: No  . Drug use: No  . Sexual activity: Yes    Birth control/protection: Surgical  Other Topics Concern  . Not on file  Social History Narrative  . Not on file   Social Determinants of Health   Financial Resource Strain: Not on file  Food Insecurity: Not on file  Transportation Needs: Not on file  Physical Activity: Not on file  Stress: Not on file  Social Connections: Not on file  Intimate Partner Violence: Not on file     Physical Exam   Vitals:   10/28/20 2344  BP: (!) 163/78  Pulse: 80  Resp: 18  Temp: 97.7 F (36.5 C)  SpO2: 98%    CONSTITUTIONAL: Well-appearing, NAD NEURO:  Alert and oriented x 3, no focal deficits EYES:  eyes equal and reactive ENT/NECK:  no LAD, no JVD CARDIO: Regular rate, well-perfused, normal S1 and S2 PULM:  CTAB no wheezing or rhonchi GI/GU:  normal bowel sounds, non-distended, non-tender MSK/SPINE:  No gross deformities, no edema SKIN:  no rash, atraumatic PSYCH:  Appropriate speech and behavior  *Additional and/or pertinent findings included in MDM below  Diagnostic and Interventional Summary    EKG Interpretation  Date/Time:    Ventricular Rate:    PR Interval:    QRS Duration:   QT Interval:    QTC Calculation:  R Axis:     Text Interpretation:        Labs Reviewed - No data to display  No orders to display    Medications  predniSONE (DELTASONE) tablet 60 mg (has no administration in time range)     Procedures  /  Critical Care Procedures  ED Course and Medical Decision Making  I have reviewed the triage vital signs, the nursing notes, and pertinent available records from the EMR.  Listed above are laboratory and imaging tests that I personally ordered, reviewed, and interpreted and then considered in my medical decision making (see below for details).  Consistent with sciatica, worse with motion, doubt genitourinary cause, benign abdomen, reproducible with motion.  Appropriate for trial of  steroids.       Barth Kirks. Sedonia Small, [place] [email or web address]  Final Clinical Impressions(s) / ED Diagnoses     ICD-10-CM   1. Sciatica of right side  M54.31     ED Discharge Orders         Ordered    predniSONE (DELTASONE) 20 MG tablet  Daily        10/29/20 0010           Discharge Instructions Discussed with and Provided to Patient:     Discharge Instructions     You were evaluated in the Emergency Department and after careful evaluation, we did not find any emergent condition requiring admission or further testing in the hospital.  Your exam/testing today was overall reassuring.  Take the prednisone as directe  Please return to the Emergency Department if you experience any worsening of your condition.  Thank you for allowing [place] to be a part of your care.       Maudie Flakes, MD 10/29/20 [phone]

## 2020-12-28 ENCOUNTER — Other Ambulatory Visit: Payer: Self-pay

## 2020-12-28 ENCOUNTER — Emergency Department (HOSPITAL_COMMUNITY): Payer: 59

## 2020-12-28 ENCOUNTER — Emergency Department (HOSPITAL_COMMUNITY)
Admission: EM | Admit: 2020-12-28 | Discharge: 2020-12-28 | Disposition: A | Discharge status: Home / Self Care | Payer: 59 | Attending: Emergency Medicine | Admitting: Emergency Medicine

## 2020-12-28 ENCOUNTER — Encounter (HOSPITAL_COMMUNITY): Payer: Self-pay

## 2020-12-28 DIAGNOSIS — E876 Hypokalemia: Secondary | ICD-10-CM | POA: Insufficient documentation

## 2020-12-28 DIAGNOSIS — R202 Paresthesia of skin: Secondary | ICD-10-CM | POA: Diagnosis not present

## 2020-12-28 DIAGNOSIS — M79602 Pain in left arm: Secondary | ICD-10-CM | POA: Diagnosis not present

## 2020-12-28 DIAGNOSIS — M25512 Pain in left shoulder: Secondary | ICD-10-CM | POA: Diagnosis not present

## 2020-12-28 DIAGNOSIS — R0789 Other chest pain: Secondary | ICD-10-CM | POA: Insufficient documentation

## 2020-12-28 DIAGNOSIS — M479 Spondylosis, unspecified: Secondary | ICD-10-CM

## 2020-12-28 DIAGNOSIS — M542 Cervicalgia: Secondary | ICD-10-CM | POA: Insufficient documentation

## 2020-12-28 DIAGNOSIS — M792 Neuralgia and neuritis, unspecified: Secondary | ICD-10-CM

## 2020-12-28 DIAGNOSIS — R072 Precordial pain: Secondary | ICD-10-CM

## 2020-12-28 LAB — BASIC METABOLIC PANEL
Anion gap: 4 — ABNORMAL LOW (ref 5–15)
BUN: 10 mg/dL (ref 6–20)
CO2: 26 mmol/L (ref 22–32)
Calcium: 8.9 mg/dL (ref 8.9–10.3)
Chloride: 109 mmol/L (ref 98–111)
Creatinine, Ser: 0.76 mg/dL (ref 0.44–1.00)
GFR, Estimated: >60 mL/min (ref >60)
Glucose, Bld: 95 mg/dL (ref 70–99)
Potassium: 3.3 mmol/L — ABNORMAL LOW (ref 3.5–5.1)
Sodium: 139 mmol/L (ref 135–145)

## 2020-12-28 LAB — CBC
HCT: 40.2 % (ref 36.0–46.0)
Hemoglobin: 12.4 g/dL (ref 12.0–15.0)
MCH: 24.7 pg — ABNORMAL LOW (ref 26.0–34.0)
MCHC: 30.8 g/dL (ref 30.0–36.0)
MCV: 80.1 fL (ref 80.0–100.0)
Platelets: 218 10*3/uL (ref 150–400)
RBC: 5.02 MIL/uL (ref 3.87–5.11)
RDW: 13.6 % (ref 11.5–15.5)
WBC: 9 10*3/uL (ref 4.0–10.5)
nRBC: 0 % (ref 0.0–0.2)

## 2020-12-28 LAB — TROPONIN I (HIGH SENSITIVITY): Troponin I (High Sensitivity): 3 ng/L (ref <18)

## 2020-12-28 MED ORDER — PREDNISONE 10 MG PO TABS
60.0000 mg | ORAL_TABLET | Freq: Once | ORAL | Status: AC
  Administered 2020-12-28: 60 mg via ORAL
  Filled 2020-12-28: qty 1

## 2020-12-28 MED ORDER — METHOCARBAMOL 750 MG PO TABS
750.0000 mg | ORAL_TABLET | Freq: Three times a day (TID) | ORAL | 0 refills | Status: DC | PRN
Start: 2020-12-28 — End: 2021-01-01

## 2020-12-28 MED ORDER — PREDNISONE 20 MG PO TABS: ORAL_TABLET | ORAL | 0 refills | Status: DC

## 2020-12-28 MED ORDER — POTASSIUM CHLORIDE CRYS ER 20 MEQ PO TBCR
40.0000 meq | EXTENDED_RELEASE_TABLET | Freq: Once | ORAL | Status: AC
  Administered 2020-12-28: 40 meq via ORAL
  Filled 2020-12-28: qty 2

## 2020-12-28 NOTE — ED Notes (Addendum)
Entered room and introduced self to patient. Pt appears to be resting in bed, respirations are even and unlabored with equal chest rise and fall. Bed is locked in the lowest position, side rails x2, call bell within reach. Pt educated on call light use and hourly rounding, verbalized understanding and in agreement at this time. All questions and concerns voiced addressed. Refreshments offered and provided per patient request.  

## 2020-12-28 NOTE — ED Provider Notes (Signed)
[facility] EMERGENCY DEPARTMENT Provider Note   CSN: [id number] Arrival date & time: 12/28/20  1626     History Chief Complaint  Patient presents with  . Arm Pain  . Chest Pain    Erica Greer is a 55 y.o. female.  Patient c/o radicular left neck/shoulder/arm pain in past few days. Symptoms acute onset, moderate, constant, radiating to left arm. Worse w certain movements, position changes. Pt states she felt she may have slept awkwardly or done lifting grandchild, but pain persists. Denies hx ddd. No other injury or fall recalled. 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 Denies arm swelling. Had transient paresthesias. No current weakness or numbness, or loss of normal arm function. Also noted transient chest pain today, mid chest, dull, at rest, currently resolved. No exertional cp or discomfort. No unusual doe or fatigue. No associated diaphoresis or nv. No pleuritic pain. No leg pain or swelling. No hx dvt or pe. No fam hx premature cad.   The history is provided by the patient.  Arm Pain Associated symptoms include chest pain. Pertinent negatives include no abdominal pain, no headaches and no shortness of breath.  Chest Pain Associated symptoms: no abdominal pain, no back pain, no cough, no fever, no headache, no nausea, no palpitations, no shortness of breath, no vomiting and no weakness        Past Medical History:  Diagnosis Date  . Chronic knee pain   . Ovarian cyst    right     Patient Active Problem List   Diagnosis Date Noted  . Mass of right ovary 09/16/2015    Past Surgical History:  Procedure Laterality Date  . LAPAROSCOPIC SALPINGO OOPHERECTOMY Right 10/04/2015   Procedure: LAPAROSCOPIC RIGHT SALPINGO OOPHORECTOMY;  Surgeon: Jonnie Kind, MD;  Location: [facility];  Service: Gynecology;  Laterality: Right;  . LAPAROSCOPIC UNILATERAL SALPINGECTOMY Left 10/04/2015   Procedure: LAPAROSCOPIC LEFT SALPINGECTOMY;  Surgeon: Jonnie Kind, MD;  Location:  [facility];  Service: Gynecology;  Laterality: Left;  . OVARIAN CYST REMOVAL Left   . TUBAL LIGATION Left    Unilateral     OB History    Gravida  5   Para  5   Term  5   Preterm      AB      Living  5     SAB      IAB      Ectopic      Multiple      Live Births              Family History  Problem Relation Age of Onset  . Diabetes Mother   . Cancer Father   . Seizures Sister     Social History   Tobacco Use  . Smoking status: Never Smoker  . Smokeless tobacco: Never Used  Substance Use Topics  . Alcohol use: No  . Drug use: No    Home Medications Prior to Admission medications   Medication Sig Start Date End Date Taking? Authorizing Provider  acetaminophen (TYLENOL) 500 MG tablet Take 500 mg by mouth 2 (two) times daily as needed for moderate pain. Reported on 03/01/2016    [provider]  fluticasone (FLONASE) 50 MCG/ACT nasal spray Place 2 sprays into both nostrils daily. 10/19/17   Francine Graven, DO  HYDROcodone-acetaminophen (NORCO/VICODIN) 5-325 MG tablet One tablet every four hours as needed for acute pain.  Limit of five days per [place] statue. 04/14/20   Luna Glasgow,  Patrick Jupiter, MD  loratadine (CLARITIN) 10 MG tablet Take 1 tablet (10 mg total) by mouth daily. Patient taking differently: Take 10 mg by mouth daily as needed for allergies.  07/23/17   Isla Pence, MD  Pseudoeph-Doxylamine-DM-APAP (NYQUIL PO) Take 30 mLs by mouth daily as needed (cold).    [provider]    Allergies    Hydrocodone  Review of Systems   Review of Systems  Constitutional: Negative for chills and fever.  HENT: Negative for sore throat.   Eyes: Negative for redness.  Respiratory: Negative for cough and shortness of breath.   Cardiovascular: Positive for chest pain. Negative for palpitations and leg swelling.  Gastrointestinal: Negative for abdominal pain, nausea and vomiting.  Genitourinary: Negative for flank pain.  Musculoskeletal: Positive  for neck pain. Negative for back pain.  Skin: Negative for rash.  Neurological: Negative for weakness and headaches.  Hematological: Does not bruise/bleed easily.  Psychiatric/Behavioral: Negative for confusion.    Physical Exam Updated Vital Signs BP (!) 143/64 (BP Location: Right Arm)   Pulse 65   Temp 98.5 F (36.9 C) (Oral)   Resp 18   Ht 1.626 m (5\' 4" )   Wt 117.9 kg   LMP 02/11/2015   SpO2 99%   BMI 44.63 kg/m   Physical Exam Vitals and nursing note reviewed.  Constitutional:      Appearance: Normal appearance. She is well-developed.  HENT:     Head: Atraumatic.     Nose: Nose normal.     Mouth/Throat:     Mouth: Mucous membranes are moist.  Eyes:     General: No scleral icterus.    Conjunctiva/sclera: Conjunctivae normal.  Neck:     Trachea: No tracheal deviation.     Comments: No neck swelling or mass. Left posterior neck muscular and left trapezius muscular tenderness.  Cardiovascular:     Rate and Rhythm: Normal rate and regular rhythm.     Pulses: Normal pulses.     Heart sounds: Normal heart sounds. No murmur heard. No friction rub. No gallop.   Pulmonary:     Effort: Pulmonary effort is normal. No respiratory distress.     Breath sounds: Normal breath sounds.     Comments: Mild chest wall tenderness. No sts or skin changes.  Abdominal:     General: Bowel sounds are normal. There is no distension.     Palpations: Abdomen is soft.     Tenderness: There is no abdominal tenderness. There is no guarding.  Genitourinary:    Comments: No cva tenderness.  Musculoskeletal:        General: No swelling.     Cervical back: Normal range of motion and neck supple. No rigidity. No muscular tenderness.     Comments: C/T spine non tender, aligned. Good rom left shoulder and elbow without pain. No LUE swelling. Radial pulse 2+.   Skin:    General: Skin is warm and dry.     Findings: No rash.  Neurological:     Mental Status: She is alert.     Comments: Alert,  speech normal. LUE motor intact, stre 5/5. Sens grossly intact.   Psychiatric:        Mood and Affect: Mood normal.     ED Results / Procedures / Treatments   Labs (all labs ordered are listed, but only abnormal results are displayed) Results for orders placed or performed during the hospital encounter of 93/71/69  Basic metabolic panel  Result Value Ref Range  Sodium 139 135 - 145 mmol/L   Potassium 3.3 (L) 3.5 - 5.1 mmol/L   Chloride 109 98 - 111 mmol/L   CO2 26 22 - 32 mmol/L   Glucose, Bld 95 70 - 99 mg/dL   BUN 10 6 - 20 mg/dL   Creatinine, Ser 0.76 0.44 - 1.00 mg/dL   Calcium 8.9 8.9 - 10.3 mg/dL   GFR, Estimated >60 >60 mL/min   Anion gap 4 (L) 5 - 15  CBC  Result Value Ref Range   WBC 9.0 4.0 - 10.5 K/uL   RBC 5.02 3.87 - 5.11 MIL/uL   Hemoglobin 12.4 12.0 - 15.0 g/dL   HCT 40.2 36.0 - 46.0 %   MCV 80.1 80.0 - 100.0 fL   MCH 24.7 (L) 26.0 - 34.0 pg   MCHC 30.8 30.0 - 36.0 g/dL   RDW 13.6 11.5 - 15.5 %   Platelets 218 150 - 400 K/uL   nRBC 0.0 0.0 - 0.2 %  Troponin I (High Sensitivity)  Result Value Ref Range   Troponin I (High Sensitivity) 3 <18 ng/L   DG Chest 2 View  Result Date: 12/28/2020 CLINICAL DATA:  Chest pain EXAM: CHEST - 2 VIEW COMPARISON:  10/19/2017 FINDINGS: The heart size and mediastinal contours are within normal limits. Both lungs are clear. The visualized skeletal structures are unremarkable. IMPRESSION: No active cardiopulmonary disease. Electronically Signed   By: Donavan Foil M.D.   On: 12/28/2020 17:02   CT CERVICAL SPINE WO CONTRAST  Result Date: 12/28/2020 CLINICAL DATA:  Neck pain with left arm radiculopathy for 2 days EXAM: CT CERVICAL SPINE WITHOUT CONTRAST TECHNIQUE: Multidetector CT imaging of the cervical spine was performed without intravenous contrast. Multiplanar CT image reconstructions were also generated. COMPARISON:  None. FINDINGS: Alignment: Alignment is anatomic. Skull base and vertebrae: No acute fracture. No primary bone  lesion or focal pathologic process. Soft tissues and spinal canal: No prevertebral fluid or swelling. No visible canal hematoma. Disc levels: Mild spondylosis most pronounced at C3-4 and C6-7. Mild symmetrical neural foraminal encroachment at C3-4. Remaining neural foramina are patent. Upper chest: Airway is patent.  Lung apices are clear. Other: Reconstructed images demonstrate no additional findings. IMPRESSION: 1. Mild cervical spondylosis most pronounced at C3-4. Symmetrical neural foraminal encroachment at that level. 2. No acute bony abnormality. Electronically Signed   By: Randa Ngo M.D.   On: 12/28/2020 19:53    EKG EKG Interpretation  Date/Time:  Wednesday December 28 2020 16:30:41 EST Ventricular Rate:  78 PR Interval:  130 QRS Duration: 78 QT Interval:  378 QTC Calculation: 430 R Axis:   -2 Text Interpretation: Normal sinus rhythm with sinus arrhythmia Nonspecific T wave abnormality Confirmed by Lajean Saver [phone]) on 12/28/2020 4:51:41 PM   Radiology DG Chest 2 View  Result Date: 12/28/2020 CLINICAL DATA:  Chest pain EXAM: CHEST - 2 VIEW COMPARISON:  10/19/2017 FINDINGS: The heart size and mediastinal contours are within normal limits. Both lungs are clear. The visualized skeletal structures are unremarkable. IMPRESSION: No active cardiopulmonary disease. Electronically Signed   By: Donavan Foil M.D.   On: 12/28/2020 17:02    Procedures Procedures   Medications Ordered in ED Medications - No data to display  ED Course  I have reviewed the triage vital signs and the nursing notes.  Pertinent labs & imaging results that were available during my care of the patient were reviewed by me and considered in my medical decision making (see chart for details).  MDM Rules/Calculators/A&P                         Labs sent. Imaging ordered.   Reviewed nursing notes and prior charts for additional history.   Prednisone po.  Acetaminophen po.   Xrays  reviewed/interpreted by me - no pna.   Labs reviewed/interpreted by me  - trop normal. k mildly low, kcl po.   CT reviewed/interpreted by me - mild degen changes.   Patient appears stable for d/c.   Rx for home.   rec pcp f/u.     Final Clinical Impression(s) / ED Diagnoses Final diagnoses:  None    Rx / DC Orders ED Discharge Orders    None       Lajean Saver, MD 12/28/20 2014

## 2020-12-28 NOTE — ED Notes (Signed)
Patient transported to CT 

## 2020-12-28 NOTE — Discharge Instructions (Addendum)
It was our pleasure to provide your ER care today - we hope that you feel better.  Take prednisone as prescribed. You may take acetaminophen as need for pain. You may also try robaxin as need for muscle pain/spasm  - no driving when taking.   From today's labs your potassium level is mildly low - eat plenty of fruits and vegetables, and follow up with primary care doctor int he next couple weeks.   Follow up with primary care doctor in 1-2 weeks.   Return to ER if worse, new symptoms, fevers, numbness/weakness, recurrent or persistent chest pain, trouble breathing, or other concern.

## 2020-12-28 NOTE — ED Notes (Signed)
ED Provider at bedside. 

## 2020-12-28 NOTE — ED Triage Notes (Signed)
Pt to er, pt tearful in triage, pt states that she is here for some pain and tingling in her L arm, states that it starts in her neck and goes down her arm, pt states that she also has some chest pain, states that it started on Monday.  States that nothing seems to make the pain better or worse.  Pt denies weakness.

## 2021-01-01 ENCOUNTER — Ambulatory Visit
Admission: EM | Admit: 2021-01-01 | Discharge: 2021-01-01 | Disposition: A | Discharge status: Home / Self Care | Payer: 59 | Attending: Family Medicine | Admitting: Family Medicine

## 2021-01-01 ENCOUNTER — Encounter: Payer: Self-pay | Admitting: Emergency Medicine

## 2021-01-01 ENCOUNTER — Other Ambulatory Visit: Payer: Self-pay

## 2021-01-01 DIAGNOSIS — M5412 Radiculopathy, cervical region: Secondary | ICD-10-CM | POA: Diagnosis not present

## 2021-01-01 DIAGNOSIS — M79602 Pain in left arm: Secondary | ICD-10-CM

## 2021-01-01 MED ORDER — CYCLOBENZAPRINE HCL 10 MG PO TABS: 10.0000 mg | ORAL_TABLET | Freq: Two times a day (BID) | ORAL | 0 refills | Status: DC | PRN

## 2021-01-01 MED ORDER — IBUPROFEN 800 MG PO TABS: 800.0000 mg | ORAL_TABLET | Freq: Three times a day (TID) | ORAL | 0 refills | Status: DC | PRN

## 2021-01-01 NOTE — Discharge Instructions (Signed)
I have sent in ibuprofen for you to take one tablet every 8 hours as needed for pain and inflammation.  I have sent in flexeril for you to take twice a day as needed for muscle spasms. This medication can make you sleepy. Do not drive or operate heavy machinery with this medication.  Follow up with this office or with primary care if symptoms are persisting.  Follow up in the ER for high fever, trouble swallowing, trouble breathing, other concerning symptoms.

## 2021-01-01 NOTE — ED Triage Notes (Signed)
Neck and left arm pain since last Monday.  She was seen in the ER and given muscle relaxers.  States she is still having pain and can't sleep.  No known injury.

## 2021-01-01 NOTE — ED Provider Notes (Signed)
[facility]    CSN: [id number] Arrival date & time: 01/01/21  1513      History   Chief Complaint No chief complaint on file.   HPI Erica Greer is a 55 y.o. female.   Reports that she has been having posterior neck and left arm pain x 6 days. Was seen in the ER and was given steroids and muscle relaxers with little relief. Denies numbness, tingling, loss of strength, chest pain, headache, nausea, vomiting, diarrhea, rash, fever, other symptoms.  ROS per HPI  The history is provided by the patient.    Past Medical History:  Diagnosis Date  . Chronic knee pain   . Ovarian cyst    right     Patient Active Problem List   Diagnosis Date Noted  . Mass of right ovary 09/16/2015    Past Surgical History:  Procedure Laterality Date  . LAPAROSCOPIC SALPINGO OOPHERECTOMY Right 10/04/2015   Procedure: LAPAROSCOPIC RIGHT SALPINGO OOPHORECTOMY;  Surgeon: Jonnie Kind, MD;  Location: [facility];  Service: Gynecology;  Laterality: Right;  . LAPAROSCOPIC UNILATERAL SALPINGECTOMY Left 10/04/2015   Procedure: LAPAROSCOPIC LEFT SALPINGECTOMY;  Surgeon: Jonnie Kind, MD;  Location: [facility];  Service: Gynecology;  Laterality: Left;  . OVARIAN CYST REMOVAL Left   . TUBAL LIGATION Left    Unilateral    OB History    Gravida  5   Para  5   Term  5   Preterm      AB      Living  5     SAB      IAB      Ectopic      Multiple      Live Births               Home Medications    Prior to Admission medications   Medication Sig Start Date End Date Taking? Authorizing Provider  cyclobenzaprine (FLEXERIL) 10 MG tablet Take 1 tablet (10 mg total) by mouth 2 (two) times daily as needed for muscle spasms. 01/01/21  Yes Faustino Congress, NP  ibuprofen (ADVIL) 800 MG tablet Take 1 tablet (800 mg total) by mouth every 8 (eight) hours as needed for moderate pain. 01/01/21  Yes Faustino Congress, NP  acetaminophen (TYLENOL) 500 MG tablet Take 500 mg by  mouth 2 (two) times daily as needed for moderate pain. Reported on 03/01/2016    [provider]  fluticasone (FLONASE) 50 MCG/ACT nasal spray Place 2 sprays into both nostrils daily. 10/19/17   Francine Graven, DO  HYDROcodone-acetaminophen (NORCO/VICODIN) 5-325 MG tablet One tablet every four hours as needed for acute pain.  Limit of five days per [place] statue. 04/14/20   Sanjuana Kava, MD  loratadine (CLARITIN) 10 MG tablet Take 1 tablet (10 mg total) by mouth daily. Patient taking differently: Take 10 mg by mouth daily as needed for allergies.  07/23/17   Isla Pence, MD  predniSONE (DELTASONE) 20 MG tablet 3 po once a day for 2 days, then 2 po once a day for 3 days, then 1 po once a day for 3 days 12/29/20   Lajean Saver, MD  Pseudoeph-Doxylamine-DM-APAP (NYQUIL PO) Take 30 mLs by mouth daily as needed (cold).    [provider]    Family History Family History  Problem Relation Age of Onset  . Diabetes Mother   . Cancer Father   . Seizures Sister     Social History Social History   Tobacco  Use  . Smoking status: Never Smoker  . Smokeless tobacco: Never Used  Substance Use Topics  . Alcohol use: No  . Drug use: No     Allergies   Hydrocodone   Review of Systems Review of Systems   Physical Exam Triage Vital Signs ED Triage Vitals  Enc Vitals Group     BP 01/01/21 1522 (!) 157/82     Pulse Rate 01/01/21 1522 85     Resp 01/01/21 1522 18     Temp 01/01/21 1522 98.2 F (36.8 C)     Temp Source 01/01/21 1522 Oral     SpO2 01/01/21 1522 97 %     Weight --      Height --      Head Circumference --      Peak Flow --      Pain Score 01/01/21 1521 10     Pain Loc --      Pain Edu? --      Excl. in [place]? --    No data found.  Updated Vital Signs BP (!) 157/82 (BP Location: Right Arm)   Pulse 85   Temp 98.2 F (36.8 C) (Oral)   Resp 18   LMP 02/11/2015   SpO2 97%   Visual Acuity Right Eye Distance:   Left Eye Distance:   Bilateral  Distance:    Right Eye Near:   Left Eye Near:    Bilateral Near:     Physical Exam Vitals and nursing note reviewed.  Constitutional:      General: She is not in acute distress.    Appearance: She is well-developed and well-nourished. She is obese.  HENT:     Head: Normocephalic and atraumatic.  Eyes:     Conjunctiva/sclera: Conjunctivae normal.  Cardiovascular:     Rate and Rhythm: Normal rate and regular rhythm.     Heart sounds: No murmur heard.   Pulmonary:     Effort: Pulmonary effort is normal. No respiratory distress.     Breath sounds: Normal breath sounds.  Abdominal:     Palpations: Abdomen is soft.     Tenderness: There is no abdominal tenderness.  Musculoskeletal:        General: Tenderness (left trapezius and left deltoid) present. No edema.     Cervical back: Neck supple. Tenderness (posterior left neck) present.  Skin:    General: Skin is warm and dry.     Capillary Refill: Capillary refill takes less than 2 seconds.  Neurological:     General: No focal deficit present.     Mental Status: She is alert and oriented to person, place, and time.  Psychiatric:        Mood and Affect: Mood and affect and mood normal.        Behavior: Behavior normal.        Thought Content: Thought content normal.      UC Treatments / Results  Labs (all labs ordered are listed, but only abnormal results are displayed) Labs Reviewed - No data to display  EKG   Radiology No results found.  Procedures Procedures (including critical care time)  Medications Ordered in UC Medications - No data to display  Initial Impression / Assessment and Plan / UC Course  I have reviewed the triage vital signs and the nursing notes.  Pertinent labs & imaging results that were available during my care of the patient were reviewed by me and considered in my medical decision making (see chart  for details).    Cervical Radiculopathy Left Arm Pain  Prescribed ibuprofen  May stop  current muscle relaxer Prescribed cyclobenzaprine Follow up with this office or with primary care if symptoms are persisting.  Follow up in the ER for high fever, trouble swallowing, trouble breathing, other concerning symptoms.    Final Clinical Impressions(s) / UC Diagnoses   Final diagnoses:  Cervical radiculopathy  Left arm pain     Discharge Instructions     I have sent in ibuprofen for you to take one tablet every 8 hours as needed for pain and inflammation.  I have sent in flexeril for you to take twice a day as needed for muscle spasms. This medication can make you sleepy. Do not drive or operate heavy machinery with this medication.  Follow up with this office or with primary care if symptoms are persisting.  Follow up in the ER for high fever, trouble swallowing, trouble breathing, other concerning symptoms.     ED Prescriptions    Medication Sig Dispense Auth. Provider   cyclobenzaprine (FLEXERIL) 10 MG tablet Take 1 tablet (10 mg total) by mouth 2 (two) times daily as needed for muscle spasms. 20 tablet Faustino Congress, NP   ibuprofen (ADVIL) 800 MG tablet Take 1 tablet (800 mg total) by mouth every 8 (eight) hours as needed for moderate pain. 21 tablet Faustino Congress, NP     PDMP not reviewed this encounter.   Faustino Congress, NP 01/04/21 [phone]

## 2021-01-18 ENCOUNTER — Ambulatory Visit (INDEPENDENT_AMBULATORY_CARE_PROVIDER_SITE_OTHER): Payer: 59 | Admitting: Internal Medicine

## 2021-01-18 ENCOUNTER — Encounter: Payer: Self-pay | Admitting: Internal Medicine

## 2021-01-18 ENCOUNTER — Other Ambulatory Visit: Payer: Self-pay

## 2021-01-18 VITALS — BP 165/82 | HR 81 | Temp 98.2°F | Resp 18 | Ht 64.0 in | Wt 292.1 lb

## 2021-01-18 DIAGNOSIS — Z7689 Persons encountering health services in other specified circumstances: Secondary | ICD-10-CM

## 2021-01-18 DIAGNOSIS — Z1211 Encounter for screening for malignant neoplasm of colon: Secondary | ICD-10-CM

## 2021-01-18 DIAGNOSIS — M1712 Unilateral primary osteoarthritis, left knee: Secondary | ICD-10-CM | POA: Diagnosis not present

## 2021-01-18 DIAGNOSIS — Z1231 Encounter for screening mammogram for malignant neoplasm of breast: Secondary | ICD-10-CM

## 2021-01-18 DIAGNOSIS — Z124 Encounter for screening for malignant neoplasm of cervix: Secondary | ICD-10-CM

## 2021-01-18 DIAGNOSIS — M545 Low back pain, unspecified: Secondary | ICD-10-CM | POA: Diagnosis not present

## 2021-01-18 DIAGNOSIS — G8929 Other chronic pain: Secondary | ICD-10-CM

## 2021-01-18 DIAGNOSIS — Z114 Encounter for screening for human immunodeficiency virus [HIV]: Secondary | ICD-10-CM

## 2021-01-18 DIAGNOSIS — I10 Essential (primary) hypertension: Secondary | ICD-10-CM | POA: Diagnosis not present

## 2021-01-18 DIAGNOSIS — M47812 Spondylosis without myelopathy or radiculopathy, cervical region: Secondary | ICD-10-CM

## 2021-01-18 DIAGNOSIS — Z1159 Encounter for screening for other viral diseases: Secondary | ICD-10-CM

## 2021-01-18 MED ORDER — TELMISARTAN-HCTZ 40-12.5 MG PO TABS
1.0000 | ORAL_TABLET | Freq: Every day | ORAL | 1 refills | Status: DC
Start: 2021-01-18 — End: 2021-03-08

## 2021-01-18 NOTE — Assessment & Plan Note (Signed)
BP Readings from Last 1 Encounters:  01/18/21 (!) 165/82   New-onset, previous review from ER visits show consistent high BP Started Telmisartan-HCTZ, will check CMP after 2 weeks Counseled for compliance with the medications Advised DASH diet and moderate exercise/walking, at least 150 mins/week

## 2021-01-18 NOTE — Assessment & Plan Note (Signed)
Diet modification and moderate exercise advised Will discuss about Bariatric surgery option later.

## 2021-01-18 NOTE — Progress Notes (Addendum)
New Patient Office Visit  Subjective:  Patient ID: Erica Greer, female    DOB: 11/01/1966  Age: 55 y.o. MRN: [id number]  CC:  Chief Complaint  Patient presents with  . New Patient (Initial Visit)    New patient hasnt had a pcp in awhile has been having left knee pain for awhile was seeing dr Aline Brochure for awhile but lost insurance     HPI Erica Greer is a 55 year old female with PMH of HTN, chronic low back pain, OA of left knee and morbid obesity who presents for establishing care.  BP is elevated today. Patient denies headache, dizziness, chest pain, dyspnea or palpitations.  She has h/o chronic low back pain, which is intermittent, radiating to LE. She currently denies any numbness, tingling or weakness of the LE. She also has been having intermittent neck pain due to cervical spondylosis. She visits Orthopedic surgeon for chronic knee pain (OA of the left knee).  She is up-to-date with COVID vaccine.       Past Medical History:  Diagnosis Date  . Arthritis    Phreesia 01/16/2021  . Chronic knee pain   . Ovarian cyst    right     Past Surgical History:  Procedure Laterality Date  . LAPAROSCOPIC SALPINGO OOPHERECTOMY Right 10/04/2015   Procedure: LAPAROSCOPIC RIGHT SALPINGO OOPHORECTOMY;  Surgeon: Jonnie Kind, MD;  Location: [facility];  Service: Gynecology;  Laterality: Right;  . LAPAROSCOPIC UNILATERAL SALPINGECTOMY Left 10/04/2015   Procedure: LAPAROSCOPIC LEFT SALPINGECTOMY;  Surgeon: Jonnie Kind, MD;  Location: [facility];  Service: Gynecology;  Laterality: Left;  . OVARIAN CYST REMOVAL Left   . TUBAL LIGATION Left    Unilateral    Family History  Problem Relation Age of Onset  . Diabetes Mother   . Cancer Father   . Seizures Sister     Social History   Socioeconomic History  . Marital status: Legally Separated    Spouse name: Not on file  . Number of children: Not on file  . Years of education: Not on file  . Highest education level: Not  on file  Occupational History  . Not on file  Tobacco Use  . Smoking status: Never Smoker  . Smokeless tobacco: Never Used  Substance and Sexual Activity  . Alcohol use: No  . Drug use: No  . Sexual activity: Yes    Birth control/protection: Surgical  Other Topics Concern  . Not on file  Social History Narrative  . Not on file   Social Determinants of Health   Financial Resource Strain: Not on file  Food Insecurity: Not on file  Transportation Needs: Not on file  Physical Activity: Not on file  Stress: Not on file  Social Connections: Not on file  Intimate Partner Violence: Not on file    ROS Review of Systems  Constitutional: Negative for chills and fever.  HENT: Negative for congestion, sinus pressure, sinus pain and sore throat.   Eyes: Negative for pain and discharge.  Respiratory: Negative for cough and shortness of breath.   Cardiovascular: Negative for chest pain and palpitations.  Gastrointestinal: Negative for abdominal pain, constipation, diarrhea, nausea and vomiting.  Endocrine: Negative for polydipsia and polyuria.  Genitourinary: Negative for dysuria and hematuria.  Musculoskeletal: Positive for arthralgias and back pain. Negative for neck pain and neck stiffness.  Skin: Negative for rash.  Neurological: Negative for dizziness and weakness.  Psychiatric/Behavioral: Negative for agitation and behavioral problems.    Objective:  Today's Vitals: BP (!) 165/82 (BP Location: Left Arm, Patient Position: Sitting, Cuff Size: Normal)   Pulse 81   Temp 98.2 F (36.8 C) (Oral)   Resp 18   Ht _0  (1.626 m)   Wt 292 lb 1.9 oz (132.5 kg)   LMP 02/11/2015   SpO2 99%   BMI 50.14 kg/m   Physical Exam Vitals reviewed.  Constitutional:      General: She is not in acute distress.    Appearance: She is obese. She is not diaphoretic.  HENT:     Head: Normocephalic and atraumatic.     Nose: Nose normal.     Mouth/Throat:     Mouth: Mucous membranes are  moist.  Eyes:     General: No scleral icterus.    Extraocular Movements: Extraocular movements intact.     Pupils: Pupils are equal, round, and reactive to light.  Cardiovascular:     Rate and Rhythm: Normal rate and regular rhythm.     Pulses: Normal pulses.     Heart sounds: Normal heart sounds. No murmur heard.   Pulmonary:     Breath sounds: Normal breath sounds. No wheezing or rales.  Musculoskeletal:        General: Swelling (left knee) present.     Cervical back: Neck supple. No tenderness.     Right lower leg: No edema.     Left lower leg: No edema.  Skin:    General: Skin is warm.     Findings: No rash.  Neurological:     General: No focal deficit present.     Mental Status: She is alert and oriented to person, place, and time.  Psychiatric:        Mood and Affect: Mood normal.        Behavior: Behavior normal.     Assessment & Plan:   Problem List Items Addressed This Visit      Encounter to establish care - Primary   Care established Previous chart reviewed History and medications reviewed with the patient      Relevant Orders  CMP14+EGFR  HgB A1c  TSH  Vitamin D (25 hydroxy)    Cardiovascular and Mediastinum   Primary hypertension    BP Readings from Last 1 Encounters:  01/18/21 (!) 165/82   New-onset, previous review from ER visits show consistent high BP Started Telmisartan-HCTZ, will check CMP after 2 weeks Counseled for compliance with the medications Advised DASH diet and moderate exercise/walking, at least 150 mins/week       Relevant Medications   telmisartan-hydrochlorothiazide (MICARDIS HCT) 40-12.5 MG tablet   Other Relevant Orders   HgB A1c   Lipid panel     Musculoskeletal and Integument   Osteoarthritis of left knee    Previous imaging of the knee reviewed She used to see Orthopedic surgeon for intraarticular injections, referred to Orthopedic surgery Tylenol/Ibuprofen PRN for now Heating pads      Relevant Orders    Ambulatory referral to Orthopedic Surgery   Vitamin D (25 hydroxy)   Cervical spondylosis    Had ER visit for neck pain, now better Flexeril and Ibuprofen PRN        Other      Chronic low back pain    Simple back exercises, material provided Tylenol PRN Heating pads If persistent, will obtain imaging Weight loss can also help with the chronic pain.      Morbid obesity ([place])    Diet modification and moderate exercise advised  Will discuss about Bariatric surgery option later.       Other Visit Diagnoses    Screening mammogram for breast cancer       Relevant Orders   MM DIAG BREAST TOMO BILATERAL   Routine cervical smear       Relevant Orders   Ambulatory referral to Obstetrics / Gynecology   Encounter for screening for HIV       Relevant Orders   HIV antibody (with reflex)   Need for hepatitis C screening test       Relevant Orders   Hepatitis C Antibody      Outpatient Encounter Medications as of 01/18/2021  Medication Sig  . acetaminophen (TYLENOL) 500 MG tablet Take 500 mg by mouth 2 (two) times daily as needed for moderate pain. Reported on 03/01/2016  . cyclobenzaprine (FLEXERIL) 10 MG tablet Take 1 tablet (10 mg total) by mouth 2 (two) times daily as needed for muscle spasms.  . fluticasone (FLONASE) 50 MCG/ACT nasal spray Place 2 sprays into both nostrils daily.  Marland Kitchen ibuprofen (ADVIL) 800 MG tablet Take 1 tablet (800 mg total) by mouth every 8 (eight) hours as needed for moderate pain.  . Pseudoeph-Doxylamine-DM-APAP (NYQUIL PO) Take 30 mLs by mouth daily as needed (cold).  Marland Kitchen telmisartan-hydrochlorothiazide (MICARDIS HCT) 40-12.5 MG tablet Take 1 tablet by mouth daily.  . [DISCONTINUED] HYDROcodone-acetaminophen (NORCO/VICODIN) 5-325 MG tablet One tablet every four hours as needed for acute pain.  Limit of five days per [place].  . [DISCONTINUED] predniSONE (DELTASONE) 20 MG tablet 3 po once a day for 2 days, then 2 po once a day for 3 days, then 1 po once a  day for 3 days  . [DISCONTINUED] loratadine (CLARITIN) 10 MG tablet Take 1 tablet (10 mg total) by mouth daily. (Patient not taking: Reported on 01/18/2021)   No facility-administered encounter medications on file as of 01/18/2021.    Follow-up: Return in about 3 weeks (around 02/08/2021) for Annual physical.   Lindell Spar, MD

## 2021-01-18 NOTE — Assessment & Plan Note (Signed)
Care established Previous chart reviewed History and medications reviewed with the patient 

## 2021-01-18 NOTE — Assessment & Plan Note (Signed)
Simple back exercises, material provided Tylenol PRN Heating pads If persistent, will obtain imaging Weight loss can also help with the chronic pain.

## 2021-01-18 NOTE — Addendum Note (Signed)
Addended by: Zacarias Pontes R on: 01/18/2021 04:40 PM   Modules accepted: Orders

## 2021-01-18 NOTE — Assessment & Plan Note (Signed)
Had ER visit for neck pain, now better Flexeril and Ibuprofen PRN

## 2021-01-18 NOTE — Patient Instructions (Addendum)
Please start taking Telmisartan-HCTZ as prescribed for hypertension.  Please get fasting blood tests after 2 weeks.  Please follow DASH diet and perform moderate exercise/walking at least 150 mins/week. [email or web address]">  DASH Eating Plan DASH stands for Dietary Approaches to Stop Hypertension. The DASH eating plan is a healthy eating plan that has been shown to:  Reduce high blood pressure (hypertension).  Reduce your risk for type 2 diabetes, heart disease, and stroke.  Help with weight loss. What are tips for following this plan? Reading food labels  Check food labels for the amount of salt (sodium) per serving. Choose foods with less than 5 percent of the Daily Value of sodium. Generally, foods with less than 300 milligrams (mg) of sodium per serving fit into this eating plan.  To find whole grains, look for the word "whole" as the first word in the ingredient list. Shopping  Buy products labeled as "low-sodium" or "no salt added."  Buy fresh foods. Avoid canned foods and pre-made or frozen meals. Cooking  Avoid adding salt when cooking. Use salt-free seasonings or herbs instead of table salt or sea salt. Check with your health care provider or pharmacist before using salt substitutes.  Do not fry foods. Cook foods using healthy methods such as baking, boiling, grilling, roasting, and broiling instead.  Cook with heart-healthy oils, such as olive, canola, avocado, soybean, or sunflower oil. Meal planning  Eat a balanced diet that includes: ? 4 or more servings of fruits and 4 or more servings of vegetables each day. Try to fill one-half of your plate with fruits and vegetables. ? 6-8 servings of whole grains each day. ? Less than 6 oz (170 g) of lean meat, poultry, or fish each day. A 3-oz (85-g) serving of meat is about the same size as a deck of cards. One egg equals 1 oz (28 g). ? 2-3 servings of low-fat dairy each day. One  serving is 1 cup (237 mL). ? 1 serving of nuts, seeds, or beans 5 times each week. ? 2-3 servings of heart-healthy fats. Healthy fats called omega-3 fatty acids are found in foods such as walnuts, flaxseeds, fortified milks, and eggs. These fats are also found in cold-water fish, such as sardines, salmon, and mackerel.  Limit how much you eat of: ? Canned or prepackaged foods. ? Food that is high in trans fat, such as some fried foods. ? Food that is high in saturated fat, such as fatty meat. ? Desserts and other sweets, sugary drinks, and other foods with added sugar. ? Full-fat dairy products.  Do not salt foods before eating.  Do not eat more than 4 egg yolks a week.  Try to eat at least 2 vegetarian meals a week.  Eat more home-cooked food and less restaurant, buffet, and fast food.   Lifestyle  When eating at a restaurant, ask that your food be prepared with less salt or no salt, if possible.  If you drink alcohol: ? Limit how much you use to:  0-1 drink a day for women who are not pregnant.  0-2 drinks a day for men. ? Be aware of how much alcohol is in your drink. In the [place] one drink equals one 12 oz bottle of beer (355 mL), one 5 oz glass of wine (148 mL), or one 1 oz glass of hard liquor (44 mL). General information  Avoid eating more than 2,300 mg of salt a day. If you have hypertension, you may need to reduce your sodium  intake to 1,500 mg a day.  Work with your health care provider to maintain a healthy body weight or to lose weight. Ask what an ideal weight is for you.  Get at least 30 minutes of exercise that causes your heart to beat faster (aerobic exercise) most days of the week. Activities may include walking, swimming, or biking.  Work with your health care provider or dietitian to adjust your eating plan to your individual calorie needs. What foods should I eat? Fruits All fresh, dried, or frozen fruit. Canned fruit in natural juice (without added  sugar). Vegetables Fresh or frozen vegetables (raw, steamed, roasted, or grilled). Low-sodium or reduced-sodium tomato and vegetable juice. Low-sodium or reduced-sodium tomato sauce and tomato paste. Low-sodium or reduced-sodium canned vegetables. Grains Whole-grain or whole-wheat bread. Whole-grain or whole-wheat pasta. Brown rice. [place]. Bulgur. Whole-grain and low-sodium cereals. Pita bread. Low-fat, low-sodium crackers. Whole-wheat flour tortillas. Meats and other proteins Skinless chicken or [place]. Ground chicken or [place]. Pork with fat trimmed off. Fish and seafood. Egg whites. Dried beans, peas, or lentils. Unsalted nuts, nut butters, and seeds. Unsalted canned beans. Lean cuts of beef with fat trimmed off. Low-sodium, lean precooked or cured meat, such as sausages or meat loaves. Dairy Low-fat (1%) or fat-free (skim) milk. Reduced-fat, low-fat, or fat-free cheeses. Nonfat, low-sodium ricotta or cottage cheese. Low-fat or nonfat yogurt. Low-fat, low-sodium cheese. Fats and oils Soft margarine without trans fats. Vegetable oil. Reduced-fat, low-fat, or light mayonnaise and salad dressings (reduced-sodium). Canola, safflower, olive, avocado, soybean, and sunflower oils. Avocado. Seasonings and condiments Herbs. Spices. Seasoning mixes without salt. Other foods Unsalted popcorn and pretzels. Fat-free sweets. The items listed above may not be a complete list of foods and beverages you can eat. Contact a dietitian for more information. What foods should I avoid? Fruits Canned fruit in a light or heavy syrup. Fried fruit. Fruit in cream or butter sauce. Vegetables Creamed or fried vegetables. Vegetables in a cheese sauce. Regular canned vegetables (not low-sodium or reduced-sodium). Regular canned tomato sauce and paste (not low-sodium or reduced-sodium). Regular tomato and vegetable juice (not low-sodium or reduced-sodium). Angie Fava. Olives. Grains Baked goods made with fat, such  as croissants, muffins, or some breads. Dry pasta or rice meal packs. Meats and other proteins Fatty cuts of meat. Ribs. Fried meat. Berniece Salines. Bologna, salami, and other precooked or cured meats, such as sausages or meat loaves. Fat from the back of a pig (fatback). Bratwurst. Salted nuts and seeds. Canned beans with added salt. Canned or smoked fish. Whole eggs or egg yolks. Chicken or [place] with skin. Dairy Whole or 2% milk, cream, and half-and-half. Whole or full-fat cream cheese. Whole-fat or sweetened yogurt. Full-fat cheese. Nondairy creamers. Whipped toppings. Processed cheese and cheese spreads. Fats and oils Butter. Stick margarine. Lard. Shortening. Ghee. Bacon fat. Tropical oils, such as coconut, palm kernel, or palm oil. Seasonings and condiments Onion salt, garlic salt, seasoned salt, table salt, and sea salt. Worcestershire sauce. Tartar sauce. Barbecue sauce. Teriyaki sauce. Soy sauce, including reduced-sodium. Steak sauce. Canned and packaged gravies. Fish sauce. Oyster sauce. Cocktail sauce. Store-bought horseradish. Ketchup. Mustard. Meat flavorings and tenderizers. Bouillon cubes. Hot sauces. Pre-made or packaged marinades. Pre-made or packaged taco seasonings. Relishes. Regular salad dressings. Other foods Salted popcorn and pretzels. The items listed above may not be a complete list of foods and beverages you should avoid. Contact a dietitian for more information. Where to find more information  National Heart, Lung, and Blood Institute: https://wilson-eaton.com/  American Heart  Association: www.heart.org  Academy of Nutrition and Dietetics: www.eatright.[place]: www.kidney.org Summary  The DASH eating plan is a healthy eating plan that has been shown to reduce high blood pressure (hypertension). It may also reduce your risk for type 2 diabetes, heart disease, and stroke.  When on the DASH eating plan, aim to eat more fresh fruits and vegetables, whole  grains, lean proteins, low-fat dairy, and heart-healthy fats.  With the DASH eating plan, you should limit salt (sodium) intake to 2,300 mg a day. If you have hypertension, you may need to reduce your sodium intake to 1,500 mg a day.  Work with your health care provider or dietitian to adjust your eating plan to your individual calorie needs. This information is not intended to replace advice given to you by your health care provider. Make sure you discuss any questions you have with your health care provider. Document Revised: 10/02/2019 Document Reviewed: 10/02/2019 Elsevier Patient Education  2021 [employer].

## 2021-01-18 NOTE — Assessment & Plan Note (Signed)
Previous imaging of the knee reviewed She used to see Orthopedic surgeon for intraarticular injections, referred to Orthopedic surgery Tylenol/Ibuprofen PRN for now Heating pads

## 2021-01-20 ENCOUNTER — Ambulatory Visit
Admission: RE | Admit: 2021-01-20 | Discharge: 2021-01-20 | Disposition: A | Discharge status: Home / Self Care | Payer: 59 | Source: Ambulatory Visit | Attending: Internal Medicine | Admitting: Internal Medicine

## 2021-01-20 ENCOUNTER — Other Ambulatory Visit: Payer: Self-pay

## 2021-01-20 ENCOUNTER — Other Ambulatory Visit: Payer: Self-pay | Admitting: Internal Medicine

## 2021-01-20 DIAGNOSIS — Z1231 Encounter for screening mammogram for malignant neoplasm of breast: Secondary | ICD-10-CM

## 2021-01-24 ENCOUNTER — Ambulatory Visit: Payer: 59 | Admitting: Orthopaedic Surgery

## 2021-01-31 ENCOUNTER — Ambulatory Visit: Payer: 59

## 2021-01-31 ENCOUNTER — Ambulatory Visit (INDEPENDENT_AMBULATORY_CARE_PROVIDER_SITE_OTHER): Payer: 59 | Admitting: Orthopaedic Surgery

## 2021-01-31 ENCOUNTER — Encounter: Payer: Self-pay | Admitting: Orthopaedic Surgery

## 2021-01-31 ENCOUNTER — Other Ambulatory Visit: Payer: Self-pay

## 2021-01-31 VITALS — BP 141/75 | HR 75 | Ht 64.0 in | Wt 295.0 lb

## 2021-01-31 DIAGNOSIS — M25562 Pain in left knee: Secondary | ICD-10-CM | POA: Diagnosis not present

## 2021-01-31 DIAGNOSIS — G8929 Other chronic pain: Secondary | ICD-10-CM

## 2021-01-31 NOTE — Progress Notes (Signed)
Her left knee has gotten worse.  I saw her in June of last year for the knee and gave injection.  She has more pain.  X-rays were done today, reported separately.  I have recommended MRI as she has giving way.  PROCEDURE NOTE:  The patient requests injections of the left knee , verbal consent was obtained.  The left knee was prepped appropriately after time out was performed.   Sterile technique was observed and injection of 1 cc of Celestone 6 mg with several cc's of plain xylocaine. Anesthesia was provided by ethyl chloride and a 20-gauge needle was used to inject the knee area. The injection was tolerated well.  A band aid dressing was applied.  The patient was advised to apply ice later today and tomorrow to the injection sight as needed.  Return in two weeks. She wants to hold off on MRI now.  Electronically Signed Sanjuana Kava, MD 3/22/20229:51 AM

## 2021-02-09 ENCOUNTER — Encounter: Payer: 59 | Admitting: Internal Medicine

## 2021-02-13 ENCOUNTER — Ambulatory Visit (HOSPITAL_COMMUNITY)
Admission: RE | Admit: 2021-02-13 | Discharge: 2021-02-13 | Disposition: A | Discharge status: Home / Self Care | Payer: 59 | Source: Ambulatory Visit | Attending: Internal Medicine | Admitting: Internal Medicine

## 2021-02-13 ENCOUNTER — Other Ambulatory Visit: Payer: Self-pay

## 2021-02-13 ENCOUNTER — Encounter: Payer: Self-pay | Admitting: Internal Medicine

## 2021-02-13 ENCOUNTER — Ambulatory Visit (INDEPENDENT_AMBULATORY_CARE_PROVIDER_SITE_OTHER): Payer: 59 | Admitting: Internal Medicine

## 2021-02-13 VITALS — BP 138/83 | HR 76 | Temp 98.3°F | Resp 18 | Ht 63.0 in | Wt 285.8 lb

## 2021-02-13 DIAGNOSIS — M79602 Pain in left arm: Secondary | ICD-10-CM | POA: Diagnosis not present

## 2021-02-13 DIAGNOSIS — I10 Essential (primary) hypertension: Secondary | ICD-10-CM

## 2021-02-13 DIAGNOSIS — M545 Low back pain, unspecified: Secondary | ICD-10-CM

## 2021-02-13 DIAGNOSIS — Z1159 Encounter for screening for other viral diseases: Secondary | ICD-10-CM

## 2021-02-13 DIAGNOSIS — M5412 Radiculopathy, cervical region: Secondary | ICD-10-CM

## 2021-02-13 DIAGNOSIS — G8929 Other chronic pain: Secondary | ICD-10-CM | POA: Diagnosis not present

## 2021-02-13 MED ORDER — CYCLOBENZAPRINE HCL 10 MG PO TABS: 10.0000 mg | ORAL_TABLET | Freq: Two times a day (BID) | ORAL | 0 refills | Status: DC | PRN

## 2021-02-13 MED ORDER — KETOROLAC TROMETHAMINE 60 MG/2ML IM SOLN
60.0000 mg | Freq: Once | INTRAMUSCULAR | Status: AC
  Administered 2021-02-13: 60 mg via INTRAMUSCULAR

## 2021-02-13 NOTE — Progress Notes (Signed)
Acute Office Visit  Subjective:    Patient ID: Erica Greer, female    DOB: [birth year], 55 y.o.   MRN: [id number]  Chief Complaint  Patient presents with  . Back Pain    Back pain has been going on for awhile but has gotten worse Pt has appt this month for pap smear    HPI Patient is in today for evaluation for back pain.  Back Pain: Patient presents for presents evaluation of low back problems.  Symptoms have been present for a long time and include pain in lumbar region, (dull in character; 7/10 in severity). Initial inciting event: none. Alleviating factors identifiable by patient are medication Ibuprofen and flexeril. Exacerbating factors identifiable by patient are movement. Treatments so far initiated by patient: Ibuprofen and Flexeril.  Of note, she has h/o cervical spondylosis.  Her BP has improved now.  Past Medical History:  Diagnosis Date  . Arthritis    [employer]reesia 01/16/2021  . Chronic knee pain   . Ovarian cyst    right     Past Surgical History:  Procedure Laterality Date  . LAPAROSCOPIC SALPINGO OOPHERECTOMY Right 10/04/2015   Procedure: LAPAROSCOPIC RIGHT SALPINGO OOPHORECTOMY;  Surgeon: Jonnie Kind, MD;  Location: [facility];  Service: Gynecology;  Laterality: Right;  . LAPAROSCOPIC UNILATERAL SALPINGECTOMY Left 10/04/2015   Procedure: LAPAROSCOPIC LEFT SALPINGECTOMY;  Surgeon: Jonnie Kind, MD;  Location: [facility];  Service: Gynecology;  Laterality: Left;  . OVARIAN CYST REMOVAL Left   . TUBAL LIGATION Left    Unilateral    Family History  Problem Relation Age of Onset  . Diabetes Mother   . Cancer Father   . Seizures Sister     Social History   Socioeconomic History  . Marital status: Legally Separated    Spouse name: Not on file  . Number of children: Not on file  . Years of education: Not on file  . Highest education level: Not on file  Occupational History  . Not on file  Tobacco Use  . Smoking status: Never Smoker  . Smokeless  tobacco: Never Used  Substance and Sexual Activity  . Alcohol use: No  . Drug use: No  . Sexual activity: Yes    Birth control/protection: Surgical  Other Topics Concern  . Not on file  Social History Narrative  . Not on file   Social Determinants of Health   Financial Resource Strain: Not on file  Food Insecurity: Not on file  Transportation Needs: Not on file  Physical Activity: Not on file  Stress: Not on file  Social Connections: Not on file  Intimate Partner Violence: Not on file    Outpatient Medications Prior to Visit  Medication Sig Dispense Refill  . acetaminophen (TYLENOL) 500 MG tablet Take 500 mg by mouth 2 (two) times daily as needed for moderate pain. Reported on 03/01/2016    . ibuprofen (ADVIL) 800 MG tablet Take 1 tablet (800 mg total) by mouth every 8 (eight) hours as needed for moderate pain. 21 tablet 0  . telmisartan-hydrochlorothiazide (MICARDIS HCT) 40-12.5 MG tablet Take 1 tablet by mouth daily. 30 tablet 1  . cyclobenzaprine (FLEXERIL) 10 MG tablet Take 1 tablet (10 mg total) by mouth 2 (two) times daily as needed for muscle spasms. 20 tablet 0  . fluticasone (FLONASE) 50 MCG/ACT nasal spray Place 2 sprays into both nostrils daily. (Patient not taking: Reported on 02/13/2021) 16 g 0   No facility-administered medications prior to visit.  Allergies  Allergen Reactions  . Hydrocodone Nausea And Vomiting    Review of Systems  Constitutional: Negative for chills and fever.  HENT: Negative for congestion, sinus pressure, sinus pain and sore throat.   Eyes: Negative for pain and discharge.  Respiratory: Negative for cough and shortness of breath.   Cardiovascular: Negative for chest pain and palpitations.  Gastrointestinal: Negative for abdominal pain, constipation, diarrhea, nausea and vomiting.  Endocrine: Negative for polydipsia and polyuria.  Genitourinary: Negative for dysuria and hematuria.  Musculoskeletal: Positive for arthralgias and back  pain. Negative for neck pain and neck stiffness.  Skin: Negative for rash.  Neurological: Negative for dizziness and weakness.  Psychiatric/Behavioral: Negative for agitation and behavioral problems.       Objective:    Physical Exam Vitals reviewed.  Constitutional:      General: She is not in acute distress.    Appearance: She is obese. She is not diaphoretic.  HENT:     Head: Normocephalic and atraumatic.     Nose: Nose normal.     Mouth/Throat:     Mouth: Mucous membranes are moist.  Eyes:     General: No scleral icterus.    Extraocular Movements: Extraocular movements intact.     Pupils: Pupils are equal, round, and reactive to light.  Cardiovascular:     Rate and Rhythm: Normal rate and regular rhythm.     Pulses: Normal pulses.     Heart sounds: Normal heart sounds. No murmur heard.   Pulmonary:     Breath sounds: Normal breath sounds. No wheezing or rales.  Musculoskeletal:        General: Swelling (left knee) and tenderness (Lumbar spine area) present.     Cervical back: Neck supple. No tenderness.     Right lower leg: No edema.     Left lower leg: No edema.  Skin:    General: Skin is warm.     Findings: No rash.  Neurological:     General: No focal deficit present.     Mental Status: She is alert and oriented to person, place, and time.  Psychiatric:        Mood and Affect: Mood normal.        Behavior: Behavior normal.     BP 138/83 (BP Location: Right Arm, Patient Position: Sitting, Cuff Size: Normal)   Pulse 76   Temp 98.3 F (36.8 C) (Oral)   Resp 18   Ht 5\' 3"  (1.6 m)   Wt 285 lb 12.8 oz (129.6 kg)   LMP 02/11/2015   SpO2 100%   BMI 50.63 kg/m  Wt Readings from Last 3 Encounters:  02/13/21 285 lb 12.8 oz (129.6 kg)  01/31/21 295 lb (133.8 kg)  01/18/21 292 lb 1.9 oz (132.5 kg)    Health Maintenance Due  Topic Date Due  . Hepatitis C Screening  Never done  . PAP SMEAR-Modifier  Never done  . COLONOSCOPY (Pts 45-51yrs Insurance  coverage will need to be confirmed)  Never done    There are no preventive care reminders to display for this patient.   No results found for: TSH Lab Results  Component Value Date   WBC 9.0 12/28/2020   HGB 12.4 12/28/2020   HCT 40.2 12/28/2020   MCV 80.1 12/28/2020   PLT 218 12/28/2020   Lab Results  Component Value Date   NA 139 12/28/2020   K 3.3 (L) 12/28/2020   CO2 26 12/28/2020   GLUCOSE 95 12/28/2020  BUN 10 12/28/2020   CREATININE 0.76 12/28/2020   BILITOT 0.2 (L) 07/23/2017   ALKPHOS 53 07/23/2017   AST 22 07/23/2017   ALT 25 07/23/2017   PROT 7.0 07/23/2017   ALBUMIN 3.9 07/23/2017   CALCIUM 8.9 12/28/2020   ANIONGAP 4 (L) 12/28/2020   No results found for: CHOL No results found for: HDL No results found for: LDLCALC No results found for: TRIG No results found for: CHOLHDL No results found for: HGBA1C     Assessment & Plan:   Problem List Items Addressed This Visit      Cardiovascular and Mediastinum   Primary hypertension    BP Readings from Last 1 Encounters:  02/13/21 138/83   Well-controlled with Telmisartan-HCTZ Counseled for compliance with the medications Advised DASH diet and moderate exercise/walking, at least 150 mins/week         Other   Chronic low back pain - Primary    Check X-tay of the lumbar spine Simple back exercises, material provided Tylenol PRN Heating pads Weight loss can also help with the chronic pain.      Relevant Medications   cyclobenzaprine (FLEXERIL) 10 MG tablet   ketorolac (TORADOL) injection 60 mg (Start on 02/13/2021  9:15 AM)   Other Relevant Orders   DG Lumbar Spine Complete    Other Visit Diagnoses    Cervical radiculopathy       Relevant Medications   cyclobenzaprine (FLEXERIL) 10 MG tablet   ketorolac (TORADOL) injection 60 mg (Start on 02/13/2021  9:15 AM)   Left arm pain       Relevant Medications   cyclobenzaprine (FLEXERIL) 10 MG tablet   Need for hepatitis C screening test        Relevant Orders   Hepatitis C Antibody       Meds ordered this encounter  Medications  . cyclobenzaprine (FLEXERIL) 10 MG tablet    Sig: Take 1 tablet (10 mg total) by mouth 2 (two) times daily as needed for muscle spasms.    Dispense:  30 tablet    Refill:  0  . ketorolac (TORADOL) injection 60 mg     Lindell Spar, MD

## 2021-02-13 NOTE — Assessment & Plan Note (Signed)
Check X-tay of the lumbar spine Simple back exercises, material provided Tylenol PRN Heating pads Weight loss can also help with the chronic pain.

## 2021-02-13 NOTE — Patient Instructions (Signed)
Chronic Back Pain When back pain lasts longer than 3 months, it is called chronic back pain. Pain may get worse at certain times (flare-ups). There are things you can do at home to manage your pain. Follow these instructions at home: Pay attention to any changes in your symptoms. Take these actions to help with your pain: Managing pain and stiffness  If told, put ice on the painful area. Your doctor may tell you to use ice for 24-48 hours after the flare-up starts. To do this: ? Put ice in a plastic bag. ? Place a towel between your skin and the bag. ? Leave the ice on for 20 minutes, 2-3 times a day.  If told, put heat on the painful area. Do this as often as told by your doctor. Use the heat source that your doctor recommends, such as a moist heat pack or a heating pad. ? Place a towel between your skin and the heat source. ? Leave the heat on for 20-30 minutes. ? Take off the heat if your skin turns bright red. This is especially important if you are unable to feel pain, heat, or cold. You may have a greater risk of getting burned.  Soak in a warm bath. This can help relieve pain.      Activity  Avoid bending and other activities that make pain worse.  When standing: ? Keep your upper back and neck straight. ? Keep your shoulders pulled back. ? Avoid slouching.  When sitting: ? Keep your back straight. ? Relax your shoulders. Do not round your shoulders or pull them backward.  Do not sit or stand in one place for long periods of time.  Take short rest breaks during the day. Lying down or standing is usually better than sitting. Resting can help relieve pain.  When sitting or lying down for a long time, do some mild activity or stretching. This will help to prevent stiffness and pain.  Get regular exercise. Ask your doctor what activities are safe for you.  Do not lift anything that is heavier than 10 lb (4.5 kg) or the limit that you are told, until your doctor says that it  is safe.  To prevent injury when you lift things: ? Bend your knees. ? Keep the weight close to your body. ? Avoid twisting.  Sleep on a firm mattress. Try lying on your side with your knees slightly bent. If you lie on your back, put a pillow under your knees.   Medicines  Treatment may include medicines for pain and swelling taken by mouth or put on the skin, prescription pain medicine, or muscle relaxants.  Take over-the-counter and prescription medicines only as told by your doctor.  Ask your doctor if the medicine prescribed to you: ? Requires you to avoid driving or using machinery. ? Can cause trouble pooping (constipation). You may need to take these actions to prevent or treat trouble pooping:  Drink enough fluid to keep your pee (urine) pale yellow.  Take over-the-counter or prescription medicines.  Eat foods that are high in fiber. These include beans, whole grains, and fresh fruits and vegetables.  Limit foods that are high in fat and sugars. These include fried or sweet foods. General instructions  Do not use any products that contain nicotine or tobacco, such as cigarettes, e-cigarettes, and chewing tobacco. If you need help quitting, ask your doctor.  Keep all follow-up visits as told by your doctor. This is important. Contact a doctor if:    Your pain does not get better with rest or medicine.  Your pain gets worse, or you have new pain.  You have a high fever.  You lose weight very quickly.  You have trouble doing your normal activities. Get help right away if:  One or both of your legs or feet feel weak.  One or both of your legs or feet lose feeling (have numbness).  You have trouble controlling when you poop (have a bowel movement) or pee (urinate).  You have bad back pain and: ? You feel like you may vomit (nauseous), or you vomit. ? You have pain in your belly (abdomen). ? You have shortness of breath. ? You faint. Summary  When back pain  lasts longer than 3 months, it is called chronic back pain.  Pain may get worse at certain times (flare-ups).  Use ice and heat as told by your doctor. Your doctor may tell you to use ice after flare-ups. This information is not intended to replace advice given to you by your health care provider. Make sure you discuss any questions you have with your health care provider. Document Revised: 12/09/2019 Document Reviewed: 12/09/2019 Elsevier Patient Education  2021 [employer] Inc.  

## 2021-02-13 NOTE — Assessment & Plan Note (Signed)
BP Readings from Last 1 Encounters:  02/13/21 138/83   Well-controlled with Telmisartan-HCTZ Counseled for compliance with the medications Advised DASH diet and moderate exercise/walking, at least 150 mins/week

## 2021-02-14 ENCOUNTER — Ambulatory Visit: Payer: 59 | Admitting: Orthopaedic Surgery

## 2021-02-14 ENCOUNTER — Encounter: Payer: Self-pay | Admitting: Orthopaedic Surgery

## 2021-02-14 VITALS — BP 140/66 | HR 76 | Ht 63.0 in | Wt 283.0 lb

## 2021-02-14 DIAGNOSIS — G8929 Other chronic pain: Secondary | ICD-10-CM

## 2021-02-14 DIAGNOSIS — M25562 Pain in left knee: Secondary | ICD-10-CM

## 2021-02-14 DIAGNOSIS — Z6841 Body Mass Index (BMI) 40.0 and over, adult: Secondary | ICD-10-CM

## 2021-02-14 MED ORDER — OXYCODONE-ACETAMINOPHEN 5-325 MG PO TABS: ORAL_TABLET | ORAL | 0 refills | Status: DC

## 2021-02-14 NOTE — Progress Notes (Signed)
Patient Erica Greer, female DOB:May 02, 1966, 55 y.o. NWG:[id number]  Chief Complaint  Patient presents with  . Knee Pain    Left knee pain, Patient reports that she is some better,     HPI  Erica Greer is a 55 y.o. female who has more pain in the left knee with swelling and giving way.  I am concerned about meniscus tear.  I will get MRI.   Body mass index is 50.13 kg/m.  The patient meets the AMA guidelines for Morbid (severe) obesity with a BMI > 40.0 and I have recommended weight loss.   ROS  Review of Systems  Musculoskeletal: Positive for arthralgias, gait problem and joint swelling.  All other systems reviewed and are negative.   All other systems reviewed and are negative.  The following is a summary of the past history medically, past history surgically, known current medicines, social history and family history.  This information is gathered electronically by the computer from prior information and documentation.  I review this each visit and have found including this information at this point in the chart is beneficial and informative.    Past Medical History:  Diagnosis Date  . Arthritis    [employer]reesia 01/16/2021  . Chronic knee pain   . Ovarian cyst    right     Past Surgical History:  Procedure Laterality Date  . LAPAROSCOPIC SALPINGO OOPHERECTOMY Right 10/04/2015   Procedure: LAPAROSCOPIC RIGHT SALPINGO OOPHORECTOMY;  Surgeon: Jonnie Kind, MD;  Location: [facility];  Service: Gynecology;  Laterality: Right;  . LAPAROSCOPIC UNILATERAL SALPINGECTOMY Left 10/04/2015   Procedure: LAPAROSCOPIC LEFT SALPINGECTOMY;  Surgeon: Jonnie Kind, MD;  Location: [facility];  Service: Gynecology;  Laterality: Left;  . OVARIAN CYST REMOVAL Left   . TUBAL LIGATION Left    Unilateral    Family History  Problem Relation Age of Onset  . Diabetes Mother   . Cancer Father   . Seizures Sister     Social History Social History   Tobacco Use  . Smoking status:  Never Smoker  . Smokeless tobacco: Never Used  Substance Use Topics  . Alcohol use: No  . Drug use: No    Allergies  Allergen Reactions  . Hydrocodone Nausea And Vomiting    Current Outpatient Medications  Medication Sig Dispense Refill  . acetaminophen (TYLENOL) 500 MG tablet Take 500 mg by mouth 2 (two) times daily as needed for moderate pain. Reported on 03/01/2016    . cyclobenzaprine (FLEXERIL) 10 MG tablet Take 1 tablet (10 mg total) by mouth 2 (two) times daily as needed for muscle spasms. 30 tablet 0  . ibuprofen (ADVIL) 800 MG tablet Take 1 tablet (800 mg total) by mouth every 8 (eight) hours as needed for moderate pain. 21 tablet 0  . oxyCODONE-acetaminophen (PERCOCET/ROXICET) 5-325 MG tablet One tablet every four hours as need for pain,  5 day limit. 25 tablet 0  . telmisartan-hydrochlorothiazide (MICARDIS HCT) 40-12.5 MG tablet Take 1 tablet by mouth daily. 30 tablet 1   No current facility-administered medications for this visit.     Physical Exam  Blood pressure 140/66, pulse 76, height 5\' 3"  (1.6 m), weight 283 lb (128.4 kg), last menstrual period 02/11/2015.  Constitutional: overall normal hygiene, normal nutrition, well developed, normal grooming, normal body habitus. Assistive device:none  Musculoskeletal: gait and station Limp left, muscle tone and strength are normal, no tremors or atrophy is present.  .  Neurological: coordination overall normal.  Deep tendon reflex/nerve  stretch intact.  Sensation normal.  Cranial nerves II-XII intact.   Skin:   Normal overall no scars, lesions, ulcers or rashes. No psoriasis.  Psychiatric: Alert and oriented x 3.  Recent memory intact, remote memory unclear.  Normal mood and affect. Well groomed.  Good eye contact.  Cardiovascular: overall no swelling, no varicosities, no edema bilaterally, normal temperatures of the legs and arms, no clubbing, cyanosis and good capillary refill.  Lymphatic: palpation is normal.  Left  knee has effusion, crepitus, ROM 0 to 105, limp left, positive medial [place].  NV intact.  All other systems reviewed and are negative   The patient has been educated about the nature of the problem(s) and counseled on treatment options.  The patient appeared to understand what I have discussed and is in agreement with it.  Encounter Diagnoses  Name Primary?  . Chronic pain of left knee Yes  . Body mass index 45.0-49.9, adult ([place])   . Morbid obesity ([place])     PLAN Call if any problems.  Precautions discussed.  Continue current medications.   Return to clinic 3 weeks   I have reviewed the [facility] web site prior to prescribing narcotic medicine for this patient.   Electronically Signed Sanjuana Kava, MD 4/5/20229:30 AM

## 2021-02-27 ENCOUNTER — Encounter: Payer: 59 | Admitting: Internal Medicine

## 2021-03-02 ENCOUNTER — Other Ambulatory Visit: Payer: Self-pay | Admitting: Orthopaedic Surgery

## 2021-03-02 DIAGNOSIS — M25562 Pain in left knee: Secondary | ICD-10-CM

## 2021-03-02 DIAGNOSIS — G8929 Other chronic pain: Secondary | ICD-10-CM

## 2021-03-03 ENCOUNTER — Other Ambulatory Visit: Payer: 59 | Admitting: Obstetrics & Gynecology

## 2021-03-07 ENCOUNTER — Encounter: Payer: Self-pay | Admitting: Orthopaedic Surgery

## 2021-03-07 ENCOUNTER — Ambulatory Visit (INDEPENDENT_AMBULATORY_CARE_PROVIDER_SITE_OTHER): Payer: 59 | Admitting: Orthopaedic Surgery

## 2021-03-07 ENCOUNTER — Other Ambulatory Visit: Payer: Self-pay

## 2021-03-07 DIAGNOSIS — M25562 Pain in left knee: Secondary | ICD-10-CM | POA: Diagnosis not present

## 2021-03-07 DIAGNOSIS — G8929 Other chronic pain: Secondary | ICD-10-CM | POA: Diagnosis not present

## 2021-03-07 DIAGNOSIS — Z6841 Body Mass Index (BMI) 40.0 and over, adult: Secondary | ICD-10-CM

## 2021-03-07 NOTE — Progress Notes (Signed)
She did not get MRI secondary to scheduling problems.  She will get it scheduled for the left knee.  PROCEDURE NOTE:  The patient requests injections of the left knee , verbal consent was obtained.  The left knee was prepped appropriately after time out was performed.   Sterile technique was observed and injection of 1 cc of Celestone 6 mg with several cc's of plain xylocaine. Anesthesia was provided by ethyl chloride and a 20-gauge needle was used to inject the knee area. The injection was tolerated well.  A band aid dressing was applied.  The patient was advised to apply ice later today and tomorrow to the injection sight as needed.  Return in three weeks.  Get MRI of the left knee.  Call if any problem.  Precautions discussed.   Electronically Signed Sanjuana Kava, MD 4/26/20229:07 AM

## 2021-03-08 ENCOUNTER — Other Ambulatory Visit: Payer: Self-pay | Admitting: Internal Medicine

## 2021-03-08 DIAGNOSIS — I10 Essential (primary) hypertension: Secondary | ICD-10-CM

## 2021-03-15 ENCOUNTER — Other Ambulatory Visit: Payer: Self-pay

## 2021-03-15 ENCOUNTER — Encounter: Payer: Self-pay | Admitting: Internal Medicine

## 2021-03-15 ENCOUNTER — Ambulatory Visit (INDEPENDENT_AMBULATORY_CARE_PROVIDER_SITE_OTHER): Payer: 59 | Admitting: Internal Medicine

## 2021-03-15 VITALS — BP 114/73 | HR 70 | Resp 18 | Ht 63.0 in | Wt 281.1 lb

## 2021-03-15 DIAGNOSIS — M545 Low back pain, unspecified: Secondary | ICD-10-CM | POA: Diagnosis not present

## 2021-03-15 DIAGNOSIS — Z0000 Encounter for general adult medical examination without abnormal findings: Secondary | ICD-10-CM | POA: Diagnosis not present

## 2021-03-15 DIAGNOSIS — Z1211 Encounter for screening for malignant neoplasm of colon: Secondary | ICD-10-CM

## 2021-03-15 DIAGNOSIS — I10 Essential (primary) hypertension: Secondary | ICD-10-CM | POA: Diagnosis not present

## 2021-03-15 DIAGNOSIS — Z0001 Encounter for general adult medical examination with abnormal findings: Secondary | ICD-10-CM | POA: Insufficient documentation

## 2021-03-15 DIAGNOSIS — G8929 Other chronic pain: Secondary | ICD-10-CM

## 2021-03-15 MED ORDER — TELMISARTAN-HCTZ 40-12.5 MG PO TABS: 1.0000 | ORAL_TABLET | Freq: Every day | ORAL | 3 refills | Status: DC

## 2021-03-15 NOTE — Progress Notes (Signed)
amb g

## 2021-03-15 NOTE — Assessment & Plan Note (Signed)
Annual exam as documented. Counseling done  re healthy lifestyle involving commitment to 150 minutes exercise per week, heart healthy diet, and attaining healthy weight.The importance of adequate sleep also discussed. Changes in health habits are decided on by the patient with goals and time frames  set for achieving them. Immunization and cancer screening needs are specifically addressed at this visit. 

## 2021-03-15 NOTE — Assessment & Plan Note (Signed)
BP Readings from Last 1 Encounters:  03/15/21 114/73   Well-controlled with Telmisartan-HCTZ Counseled for compliance with the medications Advised DASH diet and moderate exercise/walking, at least 150 mins/week

## 2021-03-15 NOTE — Patient Instructions (Signed)
Please continue to take medications as prescribed.  Please continue to follow DASH diet and perform moderate exercise/walking at least 150 mins/week.  You are being referred to GI for colonoscopy.

## 2021-03-15 NOTE — Progress Notes (Signed)
Established Patient Office Visit  Subjective:  Patient ID: Erica Greer, female    DOB: [birth year]  Age: 55 y.o. MRN: [id number]  CC:  Chief Complaint  Patient presents with  . Annual Exam    Annual Exam     HPI Erica Greer is a 55 year old female with PMH of HTN, chronic low back pain, OA of left knee and morbid obesity who presents for annual physical.  She has been doing well overall. She had knee injection for knee pain in the last week.  BP is well-controlled. Takes medications regularly. Patient denies headache, dizziness, chest pain, dyspnea or palpitations.  She has lost about 2 lbs since she started following DASH diet.  She is willing to get colonoscopy now.  Past Medical History:  Diagnosis Date  . Arthritis    Phreesia 01/16/2021  . Chronic knee pain   . Ovarian cyst    right     Past Surgical History:  Procedure Laterality Date  . LAPAROSCOPIC SALPINGO OOPHERECTOMY Right 10/04/2015   Procedure: LAPAROSCOPIC RIGHT SALPINGO OOPHORECTOMY;  Surgeon: Jonnie Kind, MD;  Location: [facility];  Service: Gynecology;  Laterality: Right;  . LAPAROSCOPIC UNILATERAL SALPINGECTOMY Left 10/04/2015   Procedure: LAPAROSCOPIC LEFT SALPINGECTOMY;  Surgeon: Jonnie Kind, MD;  Location: [facility];  Service: Gynecology;  Laterality: Left;  . OVARIAN CYST REMOVAL Left   . TUBAL LIGATION Left    Unilateral    Family History  Problem Relation Age of Onset  . Diabetes Mother   . Cancer Father   . Seizures Sister     Social History   Socioeconomic History  . Marital status: Legally Separated    Spouse name: Not on file  . Number of children: Not on file  . Years of education: Not on file  . Highest education level: Not on file  Occupational History  . Not on file  Tobacco Use  . Smoking status: Never Smoker  . Smokeless tobacco: Never Used  Substance and Sexual Activity  . Alcohol use: No  . Drug use: No  . Sexual activity: Yes    Birth  control/protection: Surgical  Other Topics Concern  . Not on file  Social History Narrative  . Not on file   Social Determinants of Health   Financial Resource Strain: Not on file  Food Insecurity: Not on file  Transportation Needs: Not on file  Physical Activity: Not on file  Stress: Not on file  Social Connections: Not on file  Intimate Partner Violence: Not on file    Outpatient Medications Prior to Visit  Medication Sig Dispense Refill  . acetaminophen (TYLENOL) 500 MG tablet Take 500 mg by mouth 2 (two) times daily as needed for moderate pain. Reported on 03/01/2016    . cyclobenzaprine (FLEXERIL) 10 MG tablet Take 1 tablet (10 mg total) by mouth 2 (two) times daily as needed for muscle spasms. 30 tablet 0  . ibuprofen (ADVIL) 800 MG tablet Take 1 tablet (800 mg total) by mouth every 8 (eight) hours as needed for moderate pain. 21 tablet 0  . oxyCODONE-acetaminophen (PERCOCET/ROXICET) 5-325 MG tablet One tablet every four hours as need for pain,  5 day limit. 25 tablet 0  . telmisartan-hydrochlorothiazide (MICARDIS HCT) 40-12.5 MG tablet TAKE 1 TABLET BY MOUTH DAILY 30 tablet 1   No facility-administered medications prior to visit.    Allergies  Allergen Reactions  . Hydrocodone Nausea And Vomiting    ROS Review of Systems  Constitutional: Negative  for chills and fever.  HENT: Negative for congestion, sinus pressure, sinus pain and sore throat.   Eyes: Negative for pain and discharge.  Respiratory: Negative for cough and shortness of breath.   Cardiovascular: Negative for chest pain and palpitations.  Gastrointestinal: Negative for abdominal pain, constipation, diarrhea, nausea and vomiting.  Endocrine: Negative for polydipsia and polyuria.  Genitourinary: Negative for dysuria and hematuria.  Musculoskeletal: Positive for arthralgias and back pain. Negative for neck pain and neck stiffness.  Skin: Negative for rash.  Neurological: Negative for dizziness and weakness.   Psychiatric/Behavioral: Negative for agitation and behavioral problems.      Objective:    Physical Exam Vitals reviewed.  Constitutional:      General: She is not in acute distress.    Appearance: She is obese. She is not diaphoretic.  HENT:     Head: Normocephalic and atraumatic.     Nose: Nose normal.     Mouth/Throat:     Mouth: Mucous membranes are moist.  Eyes:     General: No scleral icterus.    Extraocular Movements: Extraocular movements intact.     Pupils: Pupils are equal, round, and reactive to light.  Cardiovascular:     Rate and Rhythm: Normal rate and regular rhythm.     Pulses: Normal pulses.     Heart sounds: Normal heart sounds. No murmur heard.   Pulmonary:     Breath sounds: Normal breath sounds. No wheezing or rales.  Musculoskeletal:        General: Tenderness (Lumbar spine area) present.     Cervical back: Neck supple. No tenderness.     Right lower leg: No edema.     Left lower leg: No edema.  Skin:    General: Skin is warm.     Findings: No rash.  Neurological:     General: No focal deficit present.     Mental Status: She is alert and oriented to person, place, and time.  Psychiatric:        Mood and Affect: Mood normal.        Behavior: Behavior normal.     BP 114/73 (BP Location: Right Arm, Patient Position: Sitting, Cuff Size: Normal)   Pulse 70   Resp 18   Ht 5\' 3"  (1.6 m)   Wt 281 lb 1.9 oz (127.5 kg)   LMP 02/11/2015   SpO2 99%   BMI 49.80 kg/m  Wt Readings from Last 3 Encounters:  03/15/21 281 lb 1.9 oz (127.5 kg)  02/14/21 283 lb (128.4 kg)  02/13/21 285 lb 12.8 oz (129.6 kg)     Health Maintenance Due  Topic Date Due  . Hepatitis C Screening  Never done  . PAP SMEAR-Modifier  Never done  . COLONOSCOPY (Pts 45-40yrs Insurance coverage will need to be confirmed)  Never done    There are no preventive care reminders to display for this patient.  No results found for: TSH Lab Results  Component Value Date   WBC  9.0 12/28/2020   HGB 12.4 12/28/2020   HCT 40.2 12/28/2020   MCV 80.1 12/28/2020   PLT 218 12/28/2020   Lab Results  Component Value Date   NA 139 12/28/2020   K 3.3 (L) 12/28/2020   CO2 26 12/28/2020   GLUCOSE 95 12/28/2020   BUN 10 12/28/2020   CREATININE 0.76 12/28/2020   BILITOT 0.2 (L) 07/23/2017   ALKPHOS 53 07/23/2017   AST 22 07/23/2017   ALT 25 07/23/2017   PROT  7.0 07/23/2017   ALBUMIN 3.9 07/23/2017   CALCIUM 8.9 12/28/2020   ANIONGAP 4 (L) 12/28/2020   No results found for: CHOL No results found for: HDL No results found for: LDLCALC No results found for: TRIG No results found for: CHOLHDL No results found for: HGBA1C    Assessment & Plan:   Problem List Items Addressed This Visit      Annual physical exam - Primary   Annual exam as documented. Counseling done  re healthy lifestyle involving commitment to 150 minutes exercise per week, heart healthy diet, and attaining healthy weight.The importance of adequate sleep also discussed. Changes in health habits are decided on by the patient with goals and time frames  set for achieving them. Immunization and cancer screening needs are specifically addressed at this visit.      Cardiovascular and Mediastinum   Primary hypertension    BP Readings from Last 1 Encounters:  03/15/21 114/73   Well-controlled with Telmisartan-HCTZ Counseled for compliance with the medications Advised DASH diet and moderate exercise/walking, at least 150 mins/week      Relevant Medications   telmisartan-hydrochlorothiazide (MICARDIS HCT) 40-12.5 MG tablet     Other   Chronic low back pain    Follow up with Orthopedic surgeon Tylenol PRN Simple back exercises      Morbid obesity ([place]) Diet modification and moderate exercise Had 2 lb weight loss with DASH diet         Other Visit Diagnoses    Special screening for malignant neoplasms, colon       Relevant Orders   Ambulatory referral to Gastroenterology       Meds ordered this encounter  Medications  . telmisartan-hydrochlorothiazide (MICARDIS HCT) 40-12.5 MG tablet    Sig: Take 1 tablet by mouth daily.    Dispense:  30 tablet    Refill:  3    Follow-up: Return in about 4 months (around 07/16/2021) for HTN and weight check.    Lindell Spar, MD

## 2021-03-15 NOTE — Assessment & Plan Note (Signed)
Follow up with Orthopedic surgeon Tylenol PRN Simple back exercises

## 2021-03-16 ENCOUNTER — Other Ambulatory Visit (HOSPITAL_COMMUNITY)
Admission: RE | Admit: 2021-03-16 | Discharge: 2021-03-16 | Disposition: A | Discharge status: Home / Self Care | Payer: 59 | Source: Ambulatory Visit | Attending: Obstetrics & Gynecology | Admitting: Obstetrics & Gynecology

## 2021-03-16 ENCOUNTER — Ambulatory Visit (INDEPENDENT_AMBULATORY_CARE_PROVIDER_SITE_OTHER): Payer: 59 | Admitting: Obstetrics & Gynecology

## 2021-03-16 ENCOUNTER — Other Ambulatory Visit: Payer: Self-pay | Admitting: *Deleted

## 2021-03-16 ENCOUNTER — Encounter: Payer: Self-pay | Admitting: Obstetrics & Gynecology

## 2021-03-16 ENCOUNTER — Encounter: Payer: Self-pay | Admitting: Internal Medicine

## 2021-03-16 VITALS — BP 129/72 | HR 69 | Ht 63.0 in | Wt 282.6 lb

## 2021-03-16 DIAGNOSIS — Z1272 Encounter for screening for malignant neoplasm of vagina: Secondary | ICD-10-CM

## 2021-03-16 DIAGNOSIS — Z01419 Encounter for gynecological examination (general) (routine) without abnormal findings: Secondary | ICD-10-CM | POA: Insufficient documentation

## 2021-03-16 LAB — VITAMIN D 25 HYDROXY (VIT D DEFICIENCY, FRACTURES): Vit D, 25-Hydroxy: 6.4 ng/mL — ABNORMAL LOW (ref 30.0–100.0)

## 2021-03-16 LAB — CMP14+EGFR
ALT: 21 IU/L (ref 0–32)
AST: 13 IU/L (ref 0–40)
Albumin/Globulin Ratio: 1.6 (ref 1.2–2.2)
Albumin: 4.1 g/dL (ref 3.8–4.9)
Alkaline Phosphatase: 64 IU/L (ref 44–121)
BUN/Creatinine Ratio: 18 (ref 9–23)
BUN: 23 mg/dL (ref 6–24)
Bilirubin Total: 0.3 mg/dL (ref 0.0–1.2)
CO2: 23 mmol/L (ref 20–29)
Calcium: 9.4 mg/dL (ref 8.7–10.2)
Chloride: 105 mmol/L (ref 96–106)
Creatinine, Ser: 1.28 mg/dL — ABNORMAL HIGH (ref 0.57–1.00)
Globulin, Total: 2.6 g/dL (ref 1.5–4.5)
Glucose: 96 mg/dL (ref 65–99)
Potassium: 4.5 mmol/L (ref 3.5–5.2)
Sodium: 144 mmol/L (ref 134–144)
Total Protein: 6.7 g/dL (ref 6.0–8.5)
eGFR: 49 mL/min/{1.73_m2} — ABNORMAL LOW (ref >59)

## 2021-03-16 LAB — LIPID PANEL
Chol/HDL Ratio: 5.3 ratio — ABNORMAL HIGH (ref 0.0–4.4)
Cholesterol, Total: 211 mg/dL — ABNORMAL HIGH (ref 100–199)
HDL: 40 mg/dL (ref >39)
LDL Chol Calc (NIH): 128 mg/dL — ABNORMAL HIGH (ref 0–99)
Triglycerides: 241 mg/dL — ABNORMAL HIGH (ref 0–149)
VLDL Cholesterol Cal: 43 mg/dL — ABNORMAL HIGH (ref 5–40)

## 2021-03-16 LAB — HEPATITIS C ANTIBODY: Hep C Virus Ab: <0.1 s/co ratio (ref 0.0–0.9)

## 2021-03-16 LAB — HIV ANTIBODY (ROUTINE TESTING W REFLEX): HIV Screen 4th Generation wRfx: NONREACTIVE

## 2021-03-16 LAB — HEMOGLOBIN A1C
Est. average glucose Bld gHb Est-mCnc: 123 mg/dL
Hgb A1c MFr Bld: 5.9 % — ABNORMAL HIGH (ref 4.8–5.6)

## 2021-03-16 LAB — TSH: TSH: 5.02 u[IU]/mL — ABNORMAL HIGH (ref 0.450–4.500)

## 2021-03-16 NOTE — Progress Notes (Signed)
   WELL-WOMAN EXAMINATION Patient name: Erica Greer MRN [id number]  Date of birth: 10-30-1966 Chief Complaint:   Gynecologic Exam  History of Present Illness:   Erica Greer is a 55 y.o. [id number] postmenopausal female being seen today for a routine well-woman exam.  Pt has PCP who does most of her preventive care; however pap smear was not completed Today she notes: no complaints   Last pap many years ago.  Last mammogram: 01/2021 Last colonoscopy: per pt waiting for Dr. Posey Pronto to complete paperwork  Depression screen [facility] 2/9 03/15/2021 02/13/2021 01/18/2021  Decreased Interest 0 0 0  Down, Depressed, Hopeless 0 0 0  PHQ - 2 Score 0 0 0      Review of Systems:   Pertinent items are noted in HPI Denies any headaches, blurred vision, fatigue, shortness of breath, chest pain, abdominal pain, bowel movements, urination, or intercourse unless otherwise stated above.  Pertinent History Reviewed:  Reviewed past medical,surgical, social and family history.  Reviewed problem list, medications and allergies. Physical Assessment:   Vitals:   03/16/21 1546  BP: 129/72  Pulse: 69  Weight: 282 lb 9.6 oz (128.2 kg)  Height: 5\' 3"  (1.6 m)  Body mass index is 50.06 kg/m.        Physical Examination:   General appearance - well appearing, and in no distress  Mental status - alert, oriented to person, place, and time  Psych:  She has a normal mood and affect  Skin - warm and dry, normal color, no suspicious lesions noted  Chest - effort normal, all lung fields clear to auscultation bilaterally  Heart - normal rate and regular rhythm  Breasts - declined  Abdomen - soft, nontender, nondistended, no masses or organomegaly  Pelvic - VULVA: normal appearing vulva with no masses, tenderness or lesions  VAGINA: normal appearing vagina with normal color and discharge, no lesions  CERVIX: normal appearing cervix without discharge or lesions, no CMT  Thin prep pap is done with HR HPV  cotesting  UTERUS: uterus is felt to be normal size, shape, consistency and nontender   ADNEXA: No adnexal masses or tenderness noted.  Extremities:  No swelling or varicosities noted  Chaperone: Celene Squibb     Assessment & Plan:  1) Well-Woman Exam -pap collected, reviewed screening guidelines -mammogram up to date, encouraged to complete yearly -colonoscopy work up in progress  No orders of the defined types were placed in this encounter.   Meds: No orders of the defined types were placed in this encounter.   Follow-up: Return in about 1 year (around 03/16/2022) for Annual.   Janyth Pupa, DO Attending [place], [place] for [facility], [place]

## 2021-03-17 ENCOUNTER — Ambulatory Visit (HOSPITAL_COMMUNITY)
Admission: RE | Admit: 2021-03-17 | Discharge: 2021-03-17 | Disposition: A | Discharge status: Home / Self Care | Payer: 59 | Source: Ambulatory Visit | Attending: Orthopaedic Surgery | Admitting: Orthopaedic Surgery

## 2021-03-17 DIAGNOSIS — M25562 Pain in left knee: Secondary | ICD-10-CM | POA: Insufficient documentation

## 2021-03-17 DIAGNOSIS — G8929 Other chronic pain: Secondary | ICD-10-CM | POA: Insufficient documentation

## 2021-03-18 LAB — SPECIMEN STATUS REPORT

## 2021-03-18 LAB — T4, FREE: Free T4: 1.28 ng/dL (ref 0.82–1.77)

## 2021-03-20 LAB — CYTOLOGY - PAP
Comment: NEGATIVE
Diagnosis: NEGATIVE
High risk HPV: NEGATIVE

## 2021-03-21 ENCOUNTER — Telehealth: Payer: Self-pay | Admitting: *Deleted

## 2021-03-21 NOTE — Telephone Encounter (Signed)
Called patient to give PAP results. No answer, left voice mail to return my call.

## 2021-03-21 NOTE — Telephone Encounter (Signed)
Patient informed pap normal.

## 2021-03-23 ENCOUNTER — Encounter: Payer: Self-pay | Admitting: Orthopaedic Surgery

## 2021-03-23 ENCOUNTER — Other Ambulatory Visit: Payer: Self-pay

## 2021-03-23 ENCOUNTER — Ambulatory Visit (INDEPENDENT_AMBULATORY_CARE_PROVIDER_SITE_OTHER): Payer: 59 | Admitting: Orthopaedic Surgery

## 2021-03-23 DIAGNOSIS — S83242D Other tear of medial meniscus, current injury, left knee, subsequent encounter: Secondary | ICD-10-CM | POA: Diagnosis not present

## 2021-03-23 DIAGNOSIS — Z6841 Body Mass Index (BMI) 40.0 and over, adult: Secondary | ICD-10-CM | POA: Diagnosis not present

## 2021-03-23 DIAGNOSIS — G8929 Other chronic pain: Secondary | ICD-10-CM

## 2021-03-23 NOTE — Progress Notes (Signed)
My knee is not as tender today.  She had MRI of the left knee showing:  IMPRESSION: 1. Large radial tear of the medial meniscus flipped towards the intercondylar notch. 2. Complete chronic ACL tear. 3. Tricompartmental cartilage abnormalities as described above.  I have informed her of the findings.  I have recommended consideration of arthroscopy.  She wants to think about it.  It is an elective procedure.  I have independently reviewed the MRI.     I will have her see Dr. Aline Brochure or Dr. Amedeo Kinsman.  Encounter Diagnoses  Name Primary?  . Chronic pain of left knee Yes  . Other tear of medial meniscus, current injury, left knee, subsequent encounter   . Body mass index 45.0-49.9, adult ([place])   . Morbid obesity ([place])    Call if any problem.  Precautions discussed.  Electronically Signed Sanjuana Kava, MD 5/12/20228:03 AM

## 2021-03-28 ENCOUNTER — Ambulatory Visit: Payer: 59 | Admitting: Orthopaedic Surgery

## 2021-03-28 ENCOUNTER — Telehealth (INDEPENDENT_AMBULATORY_CARE_PROVIDER_SITE_OTHER): Payer: 59 | Admitting: Internal Medicine

## 2021-03-28 ENCOUNTER — Encounter: Payer: Self-pay | Admitting: Internal Medicine

## 2021-03-28 ENCOUNTER — Other Ambulatory Visit: Payer: Self-pay

## 2021-03-28 DIAGNOSIS — A09 Infectious gastroenteritis and colitis, unspecified: Secondary | ICD-10-CM | POA: Diagnosis not present

## 2021-03-28 MED ORDER — ONDANSETRON HCL 4 MG PO TABS: 4.0000 mg | ORAL_TABLET | Freq: Three times a day (TID) | ORAL | 0 refills | Status: DC | PRN

## 2021-03-28 MED ORDER — CIPROFLOXACIN HCL 500 MG PO TABS: 500.0000 mg | ORAL_TABLET | Freq: Two times a day (BID) | ORAL | 0 refills | Status: AC

## 2021-03-28 NOTE — Patient Instructions (Addendum)
Please start taking Ciprofloxacin as prescribed.  Zofran as needed for nausea as prescribed.  Please maintain at least 64 ounces of fluid intake. May need additional based on frequency of loose/watery BM.  Please continue to eat regular food as tolerated.

## 2021-03-28 NOTE — Progress Notes (Signed)
Virtual Visit via Telephone Note   This visit type was conducted due to national recommendations for restrictions regarding the COVID-19 Pandemic (e.g. social distancing) in an effort to limit this patient's exposure and mitigate transmission in our community.  Due to her co-morbid illnesses, this patient is at least at moderate risk for complications without adequate follow up.  This format is felt to be most appropriate for this patient at this time.  The patient did not have access to video technology/had technical difficulties with video requiring transitioning to audio format only (telephone).  All issues noted in this document were discussed and addressed.  No physical exam could be performed with this format.  Evaluation Performed:  Follow-up visit  Date:  03/28/2021   ID:  Erica Greer, DOB [birth year], MRN [id number]  Patient Location: Home Provider Location: Office/Clinic  Participants: Patient Location of Patient: Home Location of Provider: Telehealth Consent was obtain for visit to be over via telehealth. I verified that I am speaking with the correct person using two identifiers.  PCP:  Lindell Spar, MD   Chief Complaint:  Vomiting, diarrhea and weakness  History of Present Illness:    Erica Greer is a 55 y.o. female who has a televisit for c/o nausea, vomiting and watery diarrhea for last 4 days since she had seafood at [facility]. She is not able to tolerate PO food. She has been trying to keep herself hydrated. She denies any fever, chills, hematemesis, melena or hematochezia. Denies any respiratory symptoms currently.  The patient does not have symptoms concerning for COVID-19 infection (fever, chills, cough, or new shortness of breath).   Past Medical, Surgical, Social History, Allergies, and Medications have been Reviewed.  Past Medical History:  Diagnosis Date  . Arthritis    Phreesia 01/16/2021  . Chronic knee pain   . Ovarian cyst    right     Past Surgical History:  Procedure Laterality Date  . LAPAROSCOPIC SALPINGO OOPHERECTOMY Right 10/04/2015   Procedure: LAPAROSCOPIC RIGHT SALPINGO OOPHORECTOMY;  Surgeon: Jonnie Kind, MD;  Location: [facility];  Service: Gynecology;  Laterality: Right;  . LAPAROSCOPIC UNILATERAL SALPINGECTOMY Left 10/04/2015   Procedure: LAPAROSCOPIC LEFT SALPINGECTOMY;  Surgeon: Jonnie Kind, MD;  Location: [facility];  Service: Gynecology;  Laterality: Left;  . OVARIAN CYST REMOVAL Left   . TUBAL LIGATION Left    Unilateral     Current Meds  Medication Sig  . acetaminophen (TYLENOL) 500 MG tablet Take 500 mg by mouth 2 (two) times daily as needed for moderate pain. Reported on 03/01/2016  . ciprofloxacin (CIPRO) 500 MG tablet Take 1 tablet (500 mg total) by mouth 2 (two) times daily for 7 days.  . cyclobenzaprine (FLEXERIL) 10 MG tablet Take 1 tablet (10 mg total) by mouth 2 (two) times daily as needed for muscle spasms.  [place] Kitchen ibuprofen (ADVIL) 800 MG tablet Take 1 tablet (800 mg total) by mouth every 8 (eight) hours as needed for moderate pain.  [place] Kitchen ondansetron (ZOFRAN) 4 MG tablet Take 1 tablet (4 mg total) by mouth every 8 (eight) hours as needed for nausea or vomiting.  [place] Kitchen oxyCODONE-acetaminophen (PERCOCET/ROXICET) 5-325 MG tablet One tablet every four hours as need for pain,  5 day limit.  [place] Kitchen telmisartan-hydrochlorothiazide (MICARDIS HCT) 40-12.5 MG tablet Take 1 tablet by mouth daily.     Allergies:   Hydrocodone   ROS:   Please see the history of present illness.     All other  systems reviewed and are negative.   Labs/Other Tests and Data Reviewed:    Recent Labs: 12/28/2020: Hemoglobin 12.4; Platelets 218 03/15/2021: ALT 21; BUN 23; Creatinine, Ser 1.28; Potassium 4.5; Sodium 144; TSH 5.020   Recent Lipid Panel Lab Results  Component Value Date/Time   CHOL 211 (H) 03/15/2021 08:35 AM   TRIG 241 (H) 03/15/2021 08:35 AM   HDL 40 03/15/2021 08:35 AM   CHOLHDL 5.3 (H) 03/15/2021 08:35 AM    LDLCALC 128 (H) 03/15/2021 08:35 AM    Wt Readings from Last 3 Encounters:  03/16/21 282 lb 9.6 oz (128.2 kg)  03/15/21 281 lb 1.9 oz (127.5 kg)  02/14/21 283 lb (128.4 kg)      ASSESSMENT & PLAN:    Acute gastroenteritis From seafood Persistent for 4 days Prescribed Ciprofloxacin Advised to maintain proper hydration Zofran PRN for nausea/vomiting Contact if persistent or new symptoms  Time:   Today, I have spent 11 minutes reviewing the chart, including problem list, medications, and with the patient with telehealth technology discussing the above problems.   Medication Adjustments/Labs and Tests Ordered: Current medicines are reviewed at length with the patient today.  Concerns regarding medicines are outlined above.   Tests Ordered: No orders of the defined types were placed in this encounter.   Medication Changes: Meds ordered this encounter  Medications  . ciprofloxacin (CIPRO) 500 MG tablet    Sig: Take 1 tablet (500 mg total) by mouth 2 (two) times daily for 7 days.    Dispense:  14 tablet    Refill:  0  . ondansetron (ZOFRAN) 4 MG tablet    Sig: Take 1 tablet (4 mg total) by mouth every 8 (eight) hours as needed for nausea or vomiting.    Dispense:  20 tablet    Refill:  0     Note: This dictation was prepared with Dragon dictation along with smaller phrase technology. Similar sounding words can be transcribed inadequately or may not be corrected upon review. Any transcriptional errors that result from this process are unintentional.      Disposition:  Follow up  Signed, Lindell Spar, MD  03/28/2021 2:20 PM     [place]

## 2021-03-31 ENCOUNTER — Encounter: Payer: Self-pay | Admitting: *Deleted

## 2021-03-31 ENCOUNTER — Telehealth: Payer: Self-pay

## 2021-03-31 NOTE — Telephone Encounter (Signed)
LVM letting her know she could pick this up today before 12 if not able to come by a copy should be in mychart.

## 2021-03-31 NOTE — Telephone Encounter (Signed)
Patient called needs a return to work note to go back to work today.  She did mychart visit 5/17. She would like to come by and pick it up this morning.

## 2021-03-31 NOTE — Telephone Encounter (Signed)
Yes, please provide it. Thanks.

## 2021-04-12 ENCOUNTER — Ambulatory Visit: Payer: 59 | Admitting: Orthopedic Surgery

## 2021-05-07 ENCOUNTER — Other Ambulatory Visit: Payer: Self-pay | Admitting: Internal Medicine

## 2021-05-07 DIAGNOSIS — I10 Essential (primary) hypertension: Secondary | ICD-10-CM

## 2021-05-19 ENCOUNTER — Ambulatory Visit (INDEPENDENT_AMBULATORY_CARE_PROVIDER_SITE_OTHER): Payer: 59 | Admitting: Nurse Practitioner

## 2021-05-19 ENCOUNTER — Ambulatory Visit (HOSPITAL_COMMUNITY)
Admission: RE | Admit: 2021-05-19 | Discharge: 2021-05-19 | Disposition: A | Discharge status: Home / Self Care | Payer: 59 | Source: Ambulatory Visit | Attending: Nurse Practitioner | Admitting: Nurse Practitioner

## 2021-05-19 ENCOUNTER — Other Ambulatory Visit: Payer: Self-pay

## 2021-05-19 ENCOUNTER — Encounter: Payer: Self-pay | Admitting: Nurse Practitioner

## 2021-05-19 VITALS — BP 113/72 | HR 76 | Temp 97.8°F | Ht 63.0 in | Wt 271.0 lb

## 2021-05-19 DIAGNOSIS — K6289 Other specified diseases of anus and rectum: Secondary | ICD-10-CM

## 2021-05-19 MED ORDER — HYDROCORTISONE (PERIANAL) 2.5 % EX CREA
1.0000 | TOPICAL_CREAM | Freq: Two times a day (BID) | CUTANEOUS | 0 refills | Status: DC
Start: 2021-05-19 — End: 2021-07-31

## 2021-05-19 NOTE — Progress Notes (Signed)
Acute Office Visit  Subjective:    Patient ID: Erica Greer, female    DOB: 06/30/1966, 55 y.o.   MRN: [id number]  Chief Complaint  Patient presents with   Rectal Pain    Ongoing x3 weeks, hurts mainly towards to R side, only when she has a bm.    HPI Patient is in today for right perirectal pain when she is having a BM.  She states her pain is severe, and she has to bend over when having a BM. She denies blood in her stool, dark stools. She states that she may have some constipation. She states her BMs start out a liquid, but them not much comes out.  Past Medical History:  Diagnosis Date   Arthritis    Phreesia 01/16/2021   Chronic knee pain    Ovarian cyst    right     Past Surgical History:  Procedure Laterality Date   LAPAROSCOPIC SALPINGO OOPHERECTOMY Right 10/04/2015   Procedure: LAPAROSCOPIC RIGHT SALPINGO OOPHORECTOMY;  Surgeon: Jonnie Kind, MD;  Location: [facility];  Service: Gynecology;  Laterality: Right;   LAPAROSCOPIC UNILATERAL SALPINGECTOMY Left 10/04/2015   Procedure: LAPAROSCOPIC LEFT SALPINGECTOMY;  Surgeon: Jonnie Kind, MD;  Location: [facility];  Service: Gynecology;  Laterality: Left;   OVARIAN CYST REMOVAL Left    TUBAL LIGATION Left    Unilateral    Family History  Problem Relation Age of Onset   Diabetes Mother    Cancer Father    Seizures Sister     Social History   Socioeconomic History   Marital status: Legally Separated    Spouse name: Not on file   Number of children: Not on file   Years of education: Not on file   Highest education level: Not on file  Occupational History   Not on file  Tobacco Use   Smoking status: Never   Smokeless tobacco: Never  Substance and Sexual Activity   Alcohol use: No   Drug use: No   Sexual activity: Yes    Birth control/protection: Surgical  Other Topics Concern   Not on file  Social History Narrative   Not on file   Social Determinants of Health   Financial Resource Strain: Not on  file  Food Insecurity: Not on file  Transportation Needs: Not on file  Physical Activity: Not on file  Stress: Not on file  Social Connections: Not on file  Intimate Partner Violence: Not on file    Outpatient Medications Prior to Visit  Medication Sig Dispense Refill   acetaminophen (TYLENOL) 500 MG tablet Take 500 mg by mouth 2 (two) times daily as needed for moderate pain. Reported on 03/01/2016     ibuprofen (ADVIL) 800 MG tablet Take 1 tablet (800 mg total) by mouth every 8 (eight) hours as needed for moderate pain. 21 tablet 0   ondansetron (ZOFRAN) 4 MG tablet Take 1 tablet (4 mg total) by mouth every 8 (eight) hours as needed for nausea or vomiting. 20 tablet 0   telmisartan-hydrochlorothiazide (MICARDIS HCT) 40-12.5 MG tablet TAKE 1 TABLET BY MOUTH DAILY 30 tablet 3   cyclobenzaprine (FLEXERIL) 10 MG tablet Take 1 tablet (10 mg total) by mouth 2 (two) times daily as needed for muscle spasms. (Patient not taking: Reported on 05/19/2021) 30 tablet 0   oxyCODONE-acetaminophen (PERCOCET/ROXICET) 5-325 MG tablet One tablet every four hours as need for pain,  5 day limit. (Patient not taking: Reported on 05/19/2021) 25 tablet 0   No  facility-administered medications prior to visit.    Allergies  Allergen Reactions   Hydrocodone Nausea And Vomiting    Review of Systems  Constitutional: Negative.   Respiratory: Negative.    Cardiovascular: Negative.   Gastrointestinal:  Positive for constipation and rectal pain. Negative for anal bleeding, blood in stool, nausea and vomiting.       LBM was 05/18/21  Psychiatric/Behavioral: Negative.        Objective:    Physical Exam Constitutional:      Appearance: Normal appearance.  Abdominal:     General: Abdomen is flat. There is no distension.     Palpations: Abdomen is soft. There is no mass.     Tenderness: There is abdominal tenderness. There is no guarding or rebound.     Hernia: No hernia is present.     Comments: Hypoactive bowel  sounds; epigastric and LUQ tenderness  Neurological:     Mental Status: She is alert.    BP 113/72 (BP Location: Left Arm, Patient Position: Sitting, Cuff Size: Large)   Pulse 76   Temp 97.8 F (36.6 C) (Temporal)   Ht 5' 3"  (1.6 m)   Wt 271 lb (122.9 kg)   LMP 02/11/2015   SpO2 98%   BMI 48.01 kg/m  Wt Readings from Last 3 Encounters:  05/19/21 271 lb (122.9 kg)  03/16/21 282 lb 9.6 oz (128.2 kg)  03/15/21 281 lb 1.9 oz (127.5 kg)    Health Maintenance Due  Topic Date Due   COLONOSCOPY (Pts 45-71yr Insurance coverage will need to be confirmed)  Never done    There are no preventive care reminders to display for this patient.   Lab Results  Component Value Date   TSH 5.020 (H) 03/15/2021   Lab Results  Component Value Date   WBC 9.0 12/28/2020   HGB 12.4 12/28/2020   HCT 40.2 12/28/2020   MCV 80.1 12/28/2020   PLT 218 12/28/2020   Lab Results  Component Value Date   NA 144 03/15/2021   K 4.5 03/15/2021   CO2 23 03/15/2021   GLUCOSE 96 03/15/2021   BUN 23 03/15/2021   CREATININE 1.28 (H) 03/15/2021   BILITOT 0.3 03/15/2021   ALKPHOS 64 03/15/2021   AST 13 03/15/2021   ALT 21 03/15/2021   PROT 6.7 03/15/2021   ALBUMIN 4.1 03/15/2021   CALCIUM 9.4 03/15/2021   ANIONGAP 4 (L) 12/28/2020   EGFR 49 (L) 03/15/2021   Lab Results  Component Value Date   CHOL 211 (H) 03/15/2021   Lab Results  Component Value Date   HDL 40 03/15/2021   Lab Results  Component Value Date   LDLCALC 128 (H) 03/15/2021   Lab Results  Component Value Date   TRIG 241 (H) 03/15/2021   Lab Results  Component Value Date   CHOLHDL 5.3 (H) 03/15/2021   Lab Results  Component Value Date   HGBA1C 5.9 (H) 03/15/2021       Assessment & Plan:   Problem List Items Addressed This Visit       Other   Rectal pain - Primary    -started 3 weeks ago -attempted rectal exam with Brooke chaperoning, but pt states her pain was too much to proceed -unsure of etiology- ?  hemorrhoidal, ? Fecal impaction, ? Constipation -will get x-ray today to r/o impaction -Rx. anusol -if no impaction and no improvement; will consider GI referral        Relevant Medications   hydrocortisone (ANUSOL-HC) 2.5 %  rectal cream   Other Relevant Orders   DG Abd 2 Views     Meds ordered this encounter  Medications   hydrocortisone (ANUSOL-HC) 2.5 % rectal cream    Sig: Place 1 application rectally 2 (two) times daily.    Dispense:  30 g    Refill:  0     Noreene Larsson, NP

## 2021-05-19 NOTE — Assessment & Plan Note (Signed)
-  started 3 weeks ago -attempted rectal exam with Brooke chaperoning, but pt states her pain was too much to proceed -unsure of etiology- ? hemorrhoidal, ? Fecal impaction, ? Constipation -will get x-ray today to r/o impaction -Rx. anusol -if no impaction and no improvement; will consider GI referral

## 2021-05-22 ENCOUNTER — Other Ambulatory Visit: Payer: Self-pay | Admitting: Nurse Practitioner

## 2021-05-22 ENCOUNTER — Telehealth: Payer: Self-pay

## 2021-05-22 ENCOUNTER — Ambulatory Visit: Payer: 59 | Admitting: Nurse Practitioner

## 2021-05-22 DIAGNOSIS — K6289 Other specified diseases of anus and rectum: Secondary | ICD-10-CM

## 2021-05-22 MED ORDER — IBUPROFEN 800 MG PO TABS: 800.0000 mg | ORAL_TABLET | Freq: Three times a day (TID) | ORAL | 0 refills | Status: DC | PRN

## 2021-05-22 NOTE — Telephone Encounter (Signed)
sent 

## 2021-05-22 NOTE — Progress Notes (Signed)
No obstruction or constipation on x-ray.

## 2021-05-22 NOTE — Telephone Encounter (Signed)
Patient called was just seen Friday 7/8 asking can ibuprofen 800 mg called into [facility].

## 2021-07-18 ENCOUNTER — Encounter: Payer: Self-pay | Admitting: Internal Medicine

## 2021-07-18 ENCOUNTER — Ambulatory Visit (INDEPENDENT_AMBULATORY_CARE_PROVIDER_SITE_OTHER): Payer: 59 | Admitting: Internal Medicine

## 2021-07-18 ENCOUNTER — Other Ambulatory Visit: Payer: Self-pay

## 2021-07-18 VITALS — BP 119/71 | HR 63 | Temp 98.3°F | Resp 18 | Ht 63.0 in | Wt 266.0 lb

## 2021-07-18 DIAGNOSIS — I10 Essential (primary) hypertension: Secondary | ICD-10-CM | POA: Diagnosis not present

## 2021-07-18 DIAGNOSIS — E782 Mixed hyperlipidemia: Secondary | ICD-10-CM

## 2021-07-18 DIAGNOSIS — E559 Vitamin D deficiency, unspecified: Secondary | ICD-10-CM | POA: Insufficient documentation

## 2021-07-18 DIAGNOSIS — G8929 Other chronic pain: Secondary | ICD-10-CM

## 2021-07-18 DIAGNOSIS — K6289 Other specified diseases of anus and rectum: Secondary | ICD-10-CM | POA: Diagnosis not present

## 2021-07-18 DIAGNOSIS — E038 Other specified hypothyroidism: Secondary | ICD-10-CM | POA: Diagnosis not present

## 2021-07-18 DIAGNOSIS — M545 Low back pain, unspecified: Secondary | ICD-10-CM

## 2021-07-18 MED ORDER — TELMISARTAN-HCTZ 40-12.5 MG PO TABS: 1.0000 | ORAL_TABLET | Freq: Every day | ORAL | 5 refills | Status: DC

## 2021-07-18 MED ORDER — VITAMIN D (ERGOCALCIFEROL) 1.25 MG (50000 UNIT) PO CAPS: 50000.0000 [IU] | ORAL_CAPSULE | ORAL | 5 refills | Status: DC

## 2021-07-18 MED ORDER — IBUPROFEN 800 MG PO TABS: 800.0000 mg | ORAL_TABLET | Freq: Three times a day (TID) | ORAL | 0 refills | Status: DC | PRN

## 2021-07-18 NOTE — Assessment & Plan Note (Signed)
Lab Results  Component Value Date   TSH 5.020 (H) 03/15/2021   Will recheck TSH and free T4

## 2021-07-18 NOTE — Assessment & Plan Note (Signed)
Diet modification and moderate exercise advised - lost 15 lbs since 03/2021 Will discuss about Bariatric surgery option later.

## 2021-07-18 NOTE — Progress Notes (Signed)
Established Patient Office Visit  Subjective:  Patient ID: Erica Greer, female    DOB: 1966-10-29  Age: 55 y.o. MRN: [id number]  CC:  Chief Complaint  Patient presents with   Follow-up    4 month follow up HTN and weight check needs refill on ibuprofen     HPI Erica Greer is a 55 year old female with PMH of HTN, chronic low back pain, OA of left knee and morbid obesity who presents for follow up of her chronic medical conditions.  BP is well-controlled. Takes medications regularly. Patient denies headache, dizziness, chest pain, dyspnea or palpitations.   She has lost about 15 lbs since visit in 03/2021. She has been following DASH diet. She had an episode of rectal pain, which improved with Anusol cream. Denies any melena or hematochezia.  She has not been taking any Vitamin D supplements. She has chronic knee and back pain.  Her TSH was elevated in 03/2021. She denies any recent change in appetite, fatigue, or recent change in hair or nails.   Past Medical History:  Diagnosis Date   Arthritis    Phreesia 01/16/2021   Chronic knee pain    Ovarian cyst    right     Past Surgical History:  Procedure Laterality Date   LAPAROSCOPIC SALPINGO OOPHERECTOMY Right 10/04/2015   Procedure: LAPAROSCOPIC RIGHT SALPINGO OOPHORECTOMY;  Surgeon: Jonnie Kind, MD;  Location: [facility];  Service: Gynecology;  Laterality: Right;   LAPAROSCOPIC UNILATERAL SALPINGECTOMY Left 10/04/2015   Procedure: LAPAROSCOPIC LEFT SALPINGECTOMY;  Surgeon: Jonnie Kind, MD;  Location: [facility];  Service: Gynecology;  Laterality: Left;   OVARIAN CYST REMOVAL Left    TUBAL LIGATION Left    Unilateral    Family History  Problem Relation Age of Onset   Diabetes Mother    Cancer Father    Seizures Sister     Social History   Socioeconomic History   Marital status: Legally Separated    Spouse name: Not on file   Number of children: Not on file   Years of education: Not on file   Highest  education level: Not on file  Occupational History   Not on file  Tobacco Use   Smoking status: Never   Smokeless tobacco: Never  Substance and Sexual Activity   Alcohol use: No   Drug use: No   Sexual activity: Yes    Birth control/protection: Surgical  Other Topics Concern   Not on file  Social History Narrative   Not on file   Social Determinants of Health   Financial Resource Strain: Not on file  Food Insecurity: Not on file  Transportation Needs: Not on file  Physical Activity: Not on file  Stress: Not on file  Social Connections: Not on file  Intimate Partner Violence: Not on file    Outpatient Medications Prior to Visit  Medication Sig Dispense Refill   acetaminophen (TYLENOL) 500 MG tablet Take 500 mg by mouth 2 (two) times daily as needed for moderate pain. Reported on 03/01/2016     hydrocortisone (ANUSOL-HC) 2.5 % rectal cream Place 1 application rectally 2 (two) times daily. 30 g 0   ondansetron (ZOFRAN) 4 MG tablet Take 1 tablet (4 mg total) by mouth every 8 (eight) hours as needed for nausea or vomiting. 20 tablet 0   ibuprofen (ADVIL) 800 MG tablet Take 1 tablet (800 mg total) by mouth every 8 (eight) hours as needed. 30 tablet 0   telmisartan-hydrochlorothiazide (MICARDIS HCT) 40-12.5  MG tablet TAKE 1 TABLET BY MOUTH DAILY 30 tablet 3   No facility-administered medications prior to visit.    Allergies  Allergen Reactions   Hydrocodone Nausea And Vomiting    ROS Review of Systems  Constitutional:  Negative for chills and fever.  HENT:  Negative for congestion, sinus pressure, sinus pain and sore throat.   Eyes:  Negative for pain and discharge.  Respiratory:  Negative for cough and shortness of breath.   Cardiovascular:  Negative for chest pain and palpitations.  Gastrointestinal:  Negative for abdominal pain, constipation, diarrhea, nausea and vomiting.  Endocrine: Negative for polydipsia and polyuria.  Genitourinary:  Negative for dysuria and  hematuria.  Musculoskeletal:  Positive for arthralgias and back pain. Negative for neck pain and neck stiffness.  Skin:  Negative for rash.  Neurological:  Negative for dizziness and weakness.  Psychiatric/Behavioral:  Negative for agitation and behavioral problems.      Objective:    Physical Exam Vitals reviewed.  Constitutional:      General: She is not in acute distress.    Appearance: She is obese. She is not diaphoretic.  HENT:     Head: Normocephalic and atraumatic.     Nose: Nose normal.     Mouth/Throat:     Mouth: Mucous membranes are moist.  Eyes:     General: No scleral icterus.    Extraocular Movements: Extraocular movements intact.     Pupils: Pupils are equal, round, and reactive to light.  Cardiovascular:     Rate and Rhythm: Normal rate and regular rhythm.     Pulses: Normal pulses.     Heart sounds: Normal heart sounds. No murmur heard. Pulmonary:     Breath sounds: Normal breath sounds. No wheezing or rales.  Musculoskeletal:        General: Tenderness (Lumbar spine area) present.     Cervical back: Neck supple. No tenderness.     Right lower leg: No edema.     Left lower leg: No edema.  Skin:    General: Skin is warm.     Findings: No rash.  Neurological:     General: No focal deficit present.     Mental Status: She is alert and oriented to person, place, and time.  Psychiatric:        Mood and Affect: Mood normal.        Behavior: Behavior normal.    BP 119/71 (BP Location: Left Arm, Patient Position: Sitting, Cuff Size: Normal)   Pulse 63   Temp 98.3 F (36.8 C) (Oral)   Resp 18   Ht _0  (1.6 m)   Wt 266 lb (120.7 kg)   LMP 02/11/2015   SpO2 100%   BMI 47.12 kg/m  Wt Readings from Last 3 Encounters:  07/18/21 266 lb (120.7 kg)  05/19/21 271 lb (122.9 kg)  03/16/21 282 lb 9.6 oz (128.2 kg)     Health Maintenance Due  Topic Date Due   COLONOSCOPY (Pts 45-42yr Insurance coverage will need to be confirmed)  Never done   COVID-19  Vaccine (4 - Booster for Moderna series) 04/04/2021   INFLUENZA VACCINE  Never done    There are no preventive care reminders to display for this patient.  Lab Results  Component Value Date   TSH 5.020 (H) 03/15/2021   Lab Results  Component Value Date   WBC 9.0 12/28/2020   HGB 12.4 12/28/2020   HCT 40.2 12/28/2020   MCV 80.1 12/28/2020  PLT 218 12/28/2020   Lab Results  Component Value Date   NA 144 03/15/2021   K 4.5 03/15/2021   CO2 23 03/15/2021   GLUCOSE 96 03/15/2021   BUN 23 03/15/2021   CREATININE 1.28 (H) 03/15/2021   BILITOT 0.3 03/15/2021   ALKPHOS 64 03/15/2021   AST 13 03/15/2021   ALT 21 03/15/2021   PROT 6.7 03/15/2021   ALBUMIN 4.1 03/15/2021   CALCIUM 9.4 03/15/2021   ANIONGAP 4 (L) 12/28/2020   EGFR 49 (L) 03/15/2021   Lab Results  Component Value Date   CHOL 211 (H) 03/15/2021   Lab Results  Component Value Date   HDL 40 03/15/2021   Lab Results  Component Value Date   LDLCALC 128 (H) 03/15/2021   Lab Results  Component Value Date   TRIG 241 (H) 03/15/2021   Lab Results  Component Value Date   CHOLHDL 5.3 (H) 03/15/2021   Lab Results  Component Value Date   HGBA1C 5.9 (H) 03/15/2021      Assessment & Plan:   Problem List Items Addressed This Visit       Cardiovascular and Mediastinum   Primary hypertension - Primary    BP Readings from Last 1 Encounters:  07/18/21 119/71  Well-controlled with Telmisartan-HCTZ Counseled for compliance with the medications Advised DASH diet and moderate exercise/walking, at least 150 mins/week      Relevant Medications   telmisartan-hydrochlorothiazide (MICARDIS HCT) 40-12.5 MG tablet   Other Relevant Orders   Basic Metabolic Panel (BMET)     Endocrine   Subclinical hypothyroidism    Lab Results  Component Value Date   TSH 5.020 (H) 03/15/2021  Will recheck TSH and free T4      Relevant Orders   TSH + free T4     Other   Chronic low back pain    Follow up with Orthopedic  surgeon Tylenol/Ibuprofen PRN Simple back exercises      Relevant Medications   ibuprofen (ADVIL) 800 MG tablet   Morbid obesity ([place])    Diet modification and moderate exercise advised - lost 15 lbs since 03/2021 Will discuss about Bariatric surgery option later.      Rectal pain    Now better with Anusol cream      Relevant Medications   ibuprofen (ADVIL) 800 MG tablet   Vitamin D deficiency    Last vitamin D Lab Results  Component Value Date   VD25OH 6.4 (L) 03/15/2021  Started Vitamin D 50,000 IU qw      Relevant Medications   Vitamin D, Ergocalciferol, (DRISDOL) 1.25 MG (50000 UNIT) CAPS capsule   Other Visit Diagnoses     Mixed hyperlipidemia       Relevant Medications   telmisartan-hydrochlorothiazide (MICARDIS HCT) 40-12.5 MG tablet   Other Relevant Orders   Lipid Profile       Meds ordered this encounter  Medications   ibuprofen (ADVIL) 800 MG tablet    Sig: Take 1 tablet (800 mg total) by mouth every 8 (eight) hours as needed.    Dispense:  30 tablet    Refill:  0   telmisartan-hydrochlorothiazide (MICARDIS HCT) 40-12.5 MG tablet    Sig: Take 1 tablet by mouth daily.    Dispense:  30 tablet    Refill:  5   Vitamin D, Ergocalciferol, (DRISDOL) 1.25 MG (50000 UNIT) CAPS capsule    Sig: Take 1 capsule (50,000 Units total) by mouth every 7 (seven) days.    Dispense:  5  capsule    Refill:  5    Follow-up: Return in about 4 months (around 11/17/2021) for HTN.    Lindell Spar, MD

## 2021-07-18 NOTE — Assessment & Plan Note (Signed)
Follow up with Orthopedic surgeon Tylenol/Ibuprofen PRN Simple back exercises

## 2021-07-18 NOTE — Assessment & Plan Note (Signed)
BP Readings from Last 1 Encounters:  07/18/21 119/71   Well-controlled with Telmisartan-HCTZ Counseled for compliance with the medications Advised DASH diet and moderate exercise/walking, at least 150 mins/week

## 2021-07-18 NOTE — Assessment & Plan Note (Signed)
Last vitamin D Lab Results  Component Value Date   VD25OH 6.4 (L) 03/15/2021   Started Vitamin D 50,000 IU qw

## 2021-07-18 NOTE — Assessment & Plan Note (Signed)
Now better with Anusol cream

## 2021-07-18 NOTE — Patient Instructions (Signed)
Please continue taking medications as prescribed.  Please continue to follow low salt diet and perform moderate exercise/walking at least 150 mins/week.  Please call us to get the flu vaccine in the next week.

## 2021-07-19 ENCOUNTER — Other Ambulatory Visit: Payer: Self-pay | Admitting: Internal Medicine

## 2021-07-19 DIAGNOSIS — E782 Mixed hyperlipidemia: Secondary | ICD-10-CM

## 2021-07-19 LAB — BASIC METABOLIC PANEL
BUN/Creatinine Ratio: 12 (ref 9–23)
BUN: 17 mg/dL (ref 6–24)
CO2: 23 mmol/L (ref 20–29)
Calcium: 9.6 mg/dL (ref 8.7–10.2)
Chloride: 106 mmol/L (ref 96–106)
Creatinine, Ser: 1.39 mg/dL — ABNORMAL HIGH (ref 0.57–1.00)
Glucose: 79 mg/dL (ref 65–99)
Potassium: 4.3 mmol/L (ref 3.5–5.2)
Sodium: 142 mmol/L (ref 134–144)
eGFR: 45 mL/min/{1.73_m2} — ABNORMAL LOW (ref >59)

## 2021-07-19 LAB — LIPID PANEL
Chol/HDL Ratio: 5.1 ratio — ABNORMAL HIGH (ref 0.0–4.4)
Cholesterol, Total: 202 mg/dL — ABNORMAL HIGH (ref 100–199)
HDL: 40 mg/dL (ref >39)
LDL Chol Calc (NIH): 112 mg/dL — ABNORMAL HIGH (ref 0–99)
Triglycerides: 290 mg/dL — ABNORMAL HIGH (ref 0–149)
VLDL Cholesterol Cal: 50 mg/dL — ABNORMAL HIGH (ref 5–40)

## 2021-07-19 LAB — TSH+FREE T4
Free T4: 1.19 ng/dL (ref 0.82–1.77)
TSH: 3.42 u[IU]/mL (ref 0.450–4.500)

## 2021-07-19 MED ORDER — ATORVASTATIN CALCIUM 20 MG PO TABS: 20.0000 mg | ORAL_TABLET | Freq: Every day | ORAL | 1 refills | Status: DC

## 2021-07-20 ENCOUNTER — Encounter: Payer: Self-pay | Admitting: Internal Medicine

## 2021-07-26 NOTE — Progress Notes (Signed)
Referring Provider:Patel, Colin Broach, MD Primary Care Physician:  Lindell Spar, MD Primary Gastroenterologist:  Dr. Gala Romney  Chief Complaint  Patient presents with   Consult    TCS never done prior. No FH colon cancer, polyps   Abdominal Pain    Lower abd and after eating will have diarrhea 10-15 min later     HPI:   Erica Greer is a 55 y.o. female presenting today at the request of Lindell Spar, MD for consult colonoscopy.  Office visit due to BMI.   Today: Patient reports no prior colonoscopy.  Denies family history of colon cancer or colon polyps.  Notes lower abdominal pain and postprandial abdominal pain. Started about 1-2 months ago. Reports weight loss. Was around 300 lbs, now 265 lbs. Trying to lose weight. This is over about a 6 month period. Changing her diet. Less fast food. Works as a Freight forwarder at [employer]. Started Lipitor. Diarrhea seems to depend on what she eats and occurs a couple days a week. Can be triggered by fatty foods, ice cream. Stools will be loose and mushy, 2-3 times a day if she has diarrhea. No watery stools. No brbpr or melena. No nocturnal stools.   No abdominal surgeries.  Aside from days of loose stools, she has normal, soft, formed bowel movements daily.  Abdominal pain prior to BMs and improves thereafter.  No family history of IBD.   No upper GI problems.  Denies nausea, vomiting, reflux symptoms, or dysphagia.  Advil as needed for chronic back pain. Maybe a couple times a week, if that.   Past Medical History:  Diagnosis Date   Arthritis    Phreesia 01/16/2021   Chronic knee pain    HLD (hyperlipidemia)    HTN (hypertension)    Ovarian cyst    right    Subclinical hypothyroidism     Past Surgical History:  Procedure Laterality Date   LAPAROSCOPIC SALPINGO OOPHERECTOMY Right 10/04/2015   Procedure: LAPAROSCOPIC RIGHT SALPINGO OOPHORECTOMY;  Surgeon: Jonnie Kind, MD;  Location: [facility];  Service: Gynecology;  Laterality: Right;    LAPAROSCOPIC UNILATERAL SALPINGECTOMY Left 10/04/2015   Procedure: LAPAROSCOPIC LEFT SALPINGECTOMY;  Surgeon: Jonnie Kind, MD;  Location: [facility];  Service: Gynecology;  Laterality: Left;   OVARIAN CYST REMOVAL Left    TUBAL LIGATION Left    Unilateral    Current Outpatient Medications  Medication Sig Dispense Refill   acetaminophen (TYLENOL) 500 MG tablet Take 500 mg by mouth 2 (two) times daily as needed for moderate pain. Reported on 03/01/2016     atorvastatin (LIPITOR) 20 MG tablet Take 1 tablet (20 mg total) by mouth daily. 90 tablet 1   hydrocortisone (ANUSOL-HC) 2.5 % rectal cream Place 1 application rectally 2 (two) times daily. (Patient taking differently: Place 1 application rectally 2 (two) times daily. As needed) 30 g 0   ibuprofen (ADVIL) 800 MG tablet Take 1 tablet (800 mg total) by mouth every 8 (eight) hours as needed. 30 tablet 0   ondansetron (ZOFRAN) 4 MG tablet Take 1 tablet (4 mg total) by mouth every 8 (eight) hours as needed for nausea or vomiting. 20 tablet 0   telmisartan-hydrochlorothiazide (MICARDIS HCT) 40-12.5 MG tablet Take 1 tablet by mouth daily. 30 tablet 5   Vitamin D, Ergocalciferol, (DRISDOL) 1.25 MG (50000 UNIT) CAPS capsule Take 1 capsule (50,000 Units total) by mouth every 7 (seven) days. 5 capsule 5   No current facility-administered medications for this visit.  Allergies as of 07/27/2021 - Review Complete 07/27/2021  Allergen Reaction Noted   Hydrocodone Nausea And Vomiting 07/09/2011    Family History  Problem Relation Age of Onset   Diabetes Mother    Cancer Father    Seizures Sister     Social History   Socioeconomic History   Marital status: Legally Separated    Spouse name: Not on file   Number of children: Not on file   Years of education: Not on file   Highest education level: Not on file  Occupational History   Not on file  Tobacco Use   Smoking status: Never   Smokeless tobacco: Never  Substance and Sexual Activity    Alcohol use: No   Drug use: No   Sexual activity: Yes    Birth control/protection: Surgical  Other Topics Concern   Not on file  Social History Narrative   Not on file   Social Determinants of Health   Financial Resource Strain: Not on file  Food Insecurity: Not on file  Transportation Needs: Not on file  Physical Activity: Not on file  Stress: Not on file  Social Connections: Not on file  Intimate Partner Violence: Not on file    Review of Systems: Gen: Denies any fever, chills, cold or flulike symptoms, presyncope, syncope. CV: Denies chest pain, heart palpitations  Resp: Denies shortness of breath or cough. GI: See HPI GU : Denies urinary burning, urinary frequency, urinary hesitancy MS: See HPI Derm: Denies rash Psych: Denies depression, anxiety Heme: See HPI  Physical Exam: BP 119/74   Pulse 62   Temp (!) 97.1 F (36.2 C)   Ht '5\' 3"'$  (1.6 m)   Wt 265 lb 9.6 oz (120.5 kg)   LMP 02/11/2015   BMI 47.05 kg/m  General:   Alert and oriented. Pleasant and cooperative. Well-nourished and well-developed.  Head:  Normocephalic and atraumatic. Eyes:  Without icterus, sclera clear and conjunctiva pink.  Ears:  Normal auditory acuity. Lungs:  Clear to auscultation bilaterally. No wheezes, rales, or rhonchi. No distress.  Heart:  S1, S2 present without murmurs appreciated.  Abdomen:  +BS, soft, non-tender and non-distended. No HSM noted. No guarding or rebound. No masses appreciated.  Rectal:  Deferred  Msk:  Symmetrical without gross deformities. Normal posture. Extremities:  Without edema. Neurologic:  Alert and  oriented x4;  grossly normal neurologically. Skin:  Intact without significant lesions or rashes. Psych: Normal mood and affect.    Assessment:  55 year old female with history of HTN, HLD, arthritis/chronic back pain, obesity, subclinical hypothyroidism presenting today at the request of PCP to schedule first-ever colonoscopy.  Patient also reports  intermittent loose stools with associated lower abdominal cramping prior to bowel movements that improves thereafter for the last 1-2 months.  This seems to be triggered by dairy products or fatty meals occurring a couple times a week with max 2-3 mushy bowel movements per day in this setting. Otherwise, soft, formed BMs daily.  No alarm symptoms.  No family history of colon cancer, polyps, or IBD.  Abdominal exam benign today.  Suspect intermittent loose stools/abdominal cramping likely secondary to dietary intolerances versus IBS.   Plan:  Proceed with colonoscopy with propofol with Dr. Gala Romney in the near future. The risks, benefits, and alternatives have been discussed with the patient in detail. The patient states understanding and desires to proceed. ASA III Low-fat diet. Avoid fried, fatty foods. Lean meats that are baked, boiled, or broiled. Lactose-free diet or take Lactaid tablets  prior to dairy consumption. Requested progress report in 2-4 weeks regarding loose stools with abdominal cramping.  Consider trial of dicyclomine if ongoing, bothersome symptoms. Follow-up after colonoscopy.     Aliene Altes, PA-C [facility] 07/27/2021

## 2021-07-27 ENCOUNTER — Other Ambulatory Visit: Payer: Self-pay

## 2021-07-27 ENCOUNTER — Telehealth: Payer: Self-pay

## 2021-07-27 ENCOUNTER — Encounter: Payer: Self-pay | Admitting: Gastroenterology

## 2021-07-27 ENCOUNTER — Ambulatory Visit (INDEPENDENT_AMBULATORY_CARE_PROVIDER_SITE_OTHER): Payer: 59 | Admitting: Gastroenterology

## 2021-07-27 VITALS — BP 119/74 | HR 62 | Temp 97.1°F | Ht 63.0 in | Wt 265.6 lb

## 2021-07-27 DIAGNOSIS — Z1211 Encounter for screening for malignant neoplasm of colon: Secondary | ICD-10-CM

## 2021-07-27 DIAGNOSIS — R1030 Lower abdominal pain, unspecified: Secondary | ICD-10-CM

## 2021-07-27 DIAGNOSIS — R195 Other fecal abnormalities: Secondary | ICD-10-CM

## 2021-07-27 MED ORDER — PEG 3350-KCL-NA BICARB-NACL 420 G PO SOLR: 4000.0000 mL | ORAL | 0 refills | Status: DC

## 2021-07-27 NOTE — Patient Instructions (Signed)
We will arrange for you to have a colonoscopy with Dr. Gala Romney in the near future.   For intermittent loose stools with abdominal cramping: I suspect this may be due to dietary intolerances or possibly irritable bowel syndrome. Follow a strict low-fat diet.  Avoid fried, fatty foods. Meats should be lean (poultry or fish) and baked, boiled, or broiled. Follow a lactose-free diet or take Lactaid tablets prior to dairy consumption. If your symptoms do not improve over the next 2 to 4 weeks, please let me know and we can try a medication to help with this.  It was great meeting you today!  We will plan to see you back after your colonoscopy.   Aliene Altes, PA-C [facility]

## 2021-07-27 NOTE — Telephone Encounter (Signed)
PA form printed from [employer] website. PA form and clinical notes faxed to [employer].

## 2021-07-27 NOTE — Telephone Encounter (Signed)
Received fax from [employer], no PA required for TCS.

## 2021-07-28 ENCOUNTER — Ambulatory Visit (INDEPENDENT_AMBULATORY_CARE_PROVIDER_SITE_OTHER): Payer: 59

## 2021-07-28 DIAGNOSIS — Z111 Encounter for screening for respiratory tuberculosis: Secondary | ICD-10-CM | POA: Diagnosis not present

## 2021-07-30 ENCOUNTER — Encounter: Payer: Self-pay | Admitting: Internal Medicine

## 2021-07-31 ENCOUNTER — Encounter: Payer: Self-pay | Admitting: Internal Medicine

## 2021-07-31 ENCOUNTER — Ambulatory Visit (INDEPENDENT_AMBULATORY_CARE_PROVIDER_SITE_OTHER): Payer: 59 | Admitting: Internal Medicine

## 2021-07-31 ENCOUNTER — Other Ambulatory Visit: Payer: Self-pay

## 2021-07-31 VITALS — BP 117/57 | HR 77 | Resp 18 | Ht 63.0 in | Wt 264.1 lb

## 2021-07-31 DIAGNOSIS — K649 Unspecified hemorrhoids: Secondary | ICD-10-CM

## 2021-07-31 DIAGNOSIS — K6289 Other specified diseases of anus and rectum: Secondary | ICD-10-CM

## 2021-07-31 DIAGNOSIS — K5900 Constipation, unspecified: Secondary | ICD-10-CM

## 2021-07-31 LAB — TB SKIN TEST
Induration: 0 mm
TB Skin Test: NEGATIVE

## 2021-07-31 MED ORDER — HYDROCORTISONE (PERIANAL) 2.5 % EX CREA: 1.0000 "application " | TOPICAL_CREAM | Freq: Two times a day (BID) | CUTANEOUS | 0 refills | Status: DC

## 2021-07-31 MED ORDER — DOCUSATE SODIUM 100 MG PO CAPS: 100.0000 mg | ORAL_CAPSULE | Freq: Every day | ORAL | 0 refills | Status: AC | PRN

## 2021-07-31 NOTE — Patient Instructions (Signed)
Please apply Anusol cream for rectal pain.  Please take at least 64 ounces of fluid to avoid constipation. Please take Colace as prescribed for constipation.

## 2021-08-03 NOTE — Progress Notes (Signed)
Acute Office Visit  Subjective:    Patient ID: Erica Greer, female    DOB: 04-15-1966, 55 y.o.   MRN: [id number]  Chief Complaint  Patient presents with   Blood In Stools    Pt saw blood in her stool on 9-17 and again on 9-18 small amount but bright red only notices when she poops    HPI Patient is in today for c/o BRBPR after defecation.  She has had similar complaints in the past as well.  She has tried Anusol cream with some relief.  She mentions having some drops of bright red blood only when she defecates.  She denies any melena.  She also has chronic rectal pain.  She also has history of chronic constipation.  She denies any abdominal pain currently.  Denies any fever, chills, nausea or vomiting.  Past Medical History:  Diagnosis Date   Arthritis    [employer] 01/16/2021   Chronic knee pain    HLD (hyperlipidemia)    HTN (hypertension)    Ovarian cyst    right    Subclinical hypothyroidism     Past Surgical History:  Procedure Laterality Date   LAPAROSCOPIC SALPINGO OOPHERECTOMY Right 10/04/2015   Procedure: LAPAROSCOPIC RIGHT SALPINGO OOPHORECTOMY;  Surgeon: Jonnie Kind, MD;  Location: [facility];  Service: Gynecology;  Laterality: Right;   LAPAROSCOPIC UNILATERAL SALPINGECTOMY Left 10/04/2015   Procedure: LAPAROSCOPIC LEFT SALPINGECTOMY;  Surgeon: Jonnie Kind, MD;  Location: [facility];  Service: Gynecology;  Laterality: Left;   OVARIAN CYST REMOVAL Left    TUBAL LIGATION Left    Unilateral    Family History  Problem Relation Age of Onset   Diabetes Mother    Cancer Father    Seizures Sister    Colon cancer Neg Hx    Colon polyps Neg Hx     Social History   Socioeconomic History   Marital status: Legally Separated    Spouse name: Not on file   Number of children: Not on file   Years of education: Not on file   Highest education level: Not on file  Occupational History   Not on file  Tobacco Use   Smoking status: Never   Smokeless tobacco: Never   Substance and Sexual Activity   Alcohol use: No   Drug use: No   Sexual activity: Yes    Birth control/protection: Surgical  Other Topics Concern   Not on file  Social History Narrative   Not on file   Social Determinants of Health   Financial Resource Strain: Not on file  Food Insecurity: Not on file  Transportation Needs: Not on file  Physical Activity: Not on file  Stress: Not on file  Social Connections: Not on file  Intimate Partner Violence: Not on file    Outpatient Medications Prior to Visit  Medication Sig Dispense Refill   acetaminophen (TYLENOL) 500 MG tablet Take 500 mg by mouth 2 (two) times daily as needed for moderate pain. Reported on 03/01/2016     atorvastatin (LIPITOR) 20 MG tablet Take 1 tablet (20 mg total) by mouth daily. 90 tablet 1   ondansetron (ZOFRAN) 4 MG tablet Take 1 tablet (4 mg total) by mouth every 8 (eight) hours as needed for nausea or vomiting. 20 tablet 0   polyethylene glycol-electrolytes (TRILYTE) 420 g solution Take 4,000 mLs by mouth as directed. 4000 mL 0   telmisartan-hydrochlorothiazide (MICARDIS HCT) 40-12.5 MG tablet Take 1 tablet by mouth daily. 30 tablet 5  Vitamin D, Ergocalciferol, (DRISDOL) 1.25 MG (50000 UNIT) CAPS capsule Take 1 capsule (50,000 Units total) by mouth every 7 (seven) days. 5 capsule 5   hydrocortisone (ANUSOL-HC) 2.5 % rectal cream Place 1 application rectally 2 (two) times daily. (Patient taking differently: Place 1 application rectally 2 (two) times daily. As needed) 30 g 0   ibuprofen (ADVIL) 800 MG tablet Take 1 tablet (800 mg total) by mouth every 8 (eight) hours as needed. 30 tablet 0   No facility-administered medications prior to visit.    Allergies  Allergen Reactions   Hydrocodone Nausea And Vomiting    Review of Systems  Constitutional:  Negative for chills and fever.  Respiratory:  Negative for cough and shortness of breath.   Cardiovascular:  Negative for chest pain and palpitations.   Gastrointestinal:  Positive for blood in stool, constipation and rectal pain. Negative for nausea and vomiting.  Genitourinary:  Negative for dysuria and hematuria.  Skin:  Negative for rash.  Neurological:  Negative for dizziness and weakness.  Psychiatric/Behavioral:  Negative for agitation and behavioral problems.       Objective:    Physical Exam Constitutional:      General: She is not in acute distress.    Appearance: She is obese. She is not diaphoretic.  HENT:     Head: Normocephalic and atraumatic.  Cardiovascular:     Rate and Rhythm: Normal rate and regular rhythm.     Pulses: Normal pulses.     Heart sounds: Normal heart sounds. No murmur heard. Pulmonary:     Breath sounds: Normal breath sounds. No wheezing.  Abdominal:     Palpations: Abdomen is soft.     Tenderness: There is no abdominal tenderness.  Genitourinary:    Comments: Rectal exam denied Neurological:     General: No focal deficit present.     Mental Status: She is alert and oriented to person, place, and time.  Psychiatric:        Mood and Affect: Mood normal.        Behavior: Behavior normal.    BP (!) 117/57 (BP Location: Left Arm, Patient Position: Sitting, Cuff Size: Normal)   Pulse 77   Resp 18   Ht 5' 3" (1.6 m)   Wt 264 lb 1.3 oz (119.8 kg)   LMP 02/11/2015   SpO2 97%   BMI 46.78 kg/m  Wt Readings from Last 3 Encounters:  07/31/21 264 lb 1.3 oz (119.8 kg)  07/27/21 265 lb 9.6 oz (120.5 kg)  07/18/21 266 lb (120.7 kg)    Health Maintenance Due  Topic Date Due   COLONOSCOPY (Pts 45-55yr Insurance coverage will need to be confirmed)  Never done   COVID-19 Vaccine (4 - Booster for Moderna series) 04/04/2021    There are no preventive care reminders to display for this patient.   Lab Results  Component Value Date   TSH 3.420 07/18/2021   Lab Results  Component Value Date   WBC 9.0 12/28/2020   HGB 12.4 12/28/2020   HCT 40.2 12/28/2020   MCV 80.1 12/28/2020   PLT 218  12/28/2020   Lab Results  Component Value Date   NA 142 07/18/2021   K 4.3 07/18/2021   CO2 23 07/18/2021   GLUCOSE 79 07/18/2021   BUN 17 07/18/2021   CREATININE 1.39 (H) 07/18/2021   BILITOT 0.3 03/15/2021   ALKPHOS 64 03/15/2021   AST 13 03/15/2021   ALT 21 03/15/2021   PROT 6.7 03/15/2021  ALBUMIN 4.1 03/15/2021   CALCIUM 9.6 07/18/2021   ANIONGAP 4 (L) 12/28/2020   EGFR 45 (L) 07/18/2021   Lab Results  Component Value Date   CHOL 202 (H) 07/18/2021   Lab Results  Component Value Date   HDL 40 07/18/2021   Lab Results  Component Value Date   LDLCALC 112 (H) 07/18/2021   Lab Results  Component Value Date   TRIG 290 (H) 07/18/2021   Lab Results  Component Value Date   CHOLHDL 5.1 (H) 07/18/2021   Lab Results  Component Value Date   HGBA1C 5.9 (H) 03/15/2021       Assessment & Plan:   Problem List Items Addressed This Visit       Other   Rectal pain Rectal pain and bleeding likely due to hemorrhoids Anusol cream Has an appointment with GI   Relevant Medications   hydrocortisone (ANUSOL-HC) 2.5 % rectal cream   Other Visit Diagnoses     Hemorrhoids, unspecified hemorrhoid type    -  Primary   Constipation, unspecified constipation type     Anusol cream for hemorrhoids Advised to increase fluid intake to avoid constipation Colace as needed for constipation F/u with GI   Relevant Medications   docusate sodium (COLACE) 100 MG capsule        Meds ordered this encounter  Medications   hydrocortisone (ANUSOL-HC) 2.5 % rectal cream    Sig: Place 1 application rectally 2 (two) times daily. As needed    Dispense:  28 g    Refill:  0   docusate sodium (COLACE) 100 MG capsule    Sig: Take 1 capsule (100 mg total) by mouth daily as needed for mild constipation.    Dispense:  30 capsule    Refill:  0     Rutwik Keith Rake, MD

## 2021-09-06 ENCOUNTER — Other Ambulatory Visit: Payer: Self-pay

## 2021-09-06 ENCOUNTER — Encounter (HOSPITAL_COMMUNITY)
Admission: RE | Admit: 2021-09-06 | Discharge: 2021-09-06 | Disposition: A | Discharge status: Home / Self Care | Payer: 59 | Source: Ambulatory Visit | Attending: Internal Medicine | Admitting: Internal Medicine

## 2021-09-06 NOTE — Patient Instructions (Signed)
Erica Greer  09/06/2021     @PREFPERIOPPHARMACY @   Your procedure is scheduled on  09/08/2021.   Report to [facility] at  1115 A.M.   Call this number if you have problems the morning of surgery:  [phone]   Remember:  Follow the diet and prep instructions given to you by the office.    Take these medicines the morning of surgery with A SIP OF WATER                                     None.     Do not wear jewelry, make-up or nail polish.  Do not wear lotions, powders, or perfumes, or deodorant.  Do not shave 48 hours prior to surgery.  Men may shave face and neck.  Do not bring valuables to the hospital.  [facility] is not responsible for any belongings or valuables.  Contacts, dentures or bridgework may not be worn into surgery.  Leave your suitcase in the car.  After surgery it may be brought to your room.  For patients admitted to the hospital, discharge time will be determined by your treatment team.  Patients discharged the day of surgery will not be allowed to drive home and must have someone with them for 24 hours.    Special instructions:   DO NOT smoke tobacco or vape for 24 hours before your procedure.  Please read over the following fact sheets that you were given. Anesthesia Post-op Instructions and Care and Recovery After Surgery      Colonoscopy, Adult, Care After This sheet gives you information about how to care for yourself after your procedure. Your health care provider may also give you more specific instructions. If you have problems or questions, contact your health care provider. What can I expect after the procedure? After the procedure, it is common to have: A small amount of blood in your stool for 24 hours after the procedure. Some gas. Mild cramping or bloating of your abdomen. Follow these instructions at home: Eating and drinking  Drink enough fluid to keep your urine pale yellow. Follow instructions from your  health care provider about eating or drinking restrictions. Resume your normal diet as instructed by your health care provider. Avoid heavy or fried foods that are hard to digest. Activity Rest as told by your health care provider. Avoid sitting for a long time without moving. Get up to take short walks every 1-2 hours. This is important to improve blood flow and breathing. Ask for help if you feel weak or unsteady. Return to your normal activities as told by your health care provider. Ask your health care provider what activities are safe for you. Managing cramping and bloating  Try walking around when you have cramps or feel bloated. Apply heat to your abdomen as told by your health care provider. Use the heat source that your health care provider recommends, such as a moist heat pack or a heating pad. Place a towel between your skin and the heat source. Leave the heat on for 20-30 minutes. Remove the heat if your skin turns bright red. This is especially important if you are unable to feel pain, heat, or cold. You may have a greater risk of getting burned. General instructions If you were given a sedative during the procedure, it can affect you for several hours. Do not  drive or operate machinery until your health care provider says that it is safe. For the first 24 hours after the procedure: Do not sign important documents. Do not drink alcohol. Do your regular daily activities at a slower pace than normal. Eat soft foods that are easy to digest. Take over-the-counter and prescription medicines only as told by your health care provider. Keep all follow-up visits as told by your health care provider. This is important. Contact a health care provider if: You have blood in your stool 2-3 days after the procedure. Get help right away if you have: More than a small spotting of blood in your stool. Large blood clots in your stool. Swelling of your abdomen. Nausea or vomiting. A  fever. Increasing pain in your abdomen that is not relieved with medicine. Summary After the procedure, it is common to have a small amount of blood in your stool. You may also have mild cramping and bloating of your abdomen. If you were given a sedative during the procedure, it can affect you for several hours. Do not drive or operate machinery until your health care provider says that it is safe. Get help right away if you have a lot of blood in your stool, nausea or vomiting, a fever, or increased pain in your abdomen. This information is not intended to replace advice given to you by your health care provider. Make sure you discuss any questions you have with your health care provider. Document Revised: 10/23/2019 Document Reviewed: 05/25/2019 Elsevier Patient Education  [place] After This sheet gives you information about how to care for yourself after your procedure. Your health care provider may also give you more specific instructions. If you have problems or questions, contact your health care provider. What can I expect after the procedure? After the procedure, it is common to have: Tiredness. Forgetfulness about what happened after the procedure. Impaired judgment for important decisions. Nausea or vomiting. Some difficulty with balance. Follow these instructions at home: For the time period you were told by your health care provider:   Rest as needed. Do not participate in activities where you could fall or become injured. Do not drive or use machinery. Do not drink alcohol. Do not take sleeping pills or medicines that cause drowsiness. Do not make important decisions or sign legal documents. Do not take care of children on your own. Eating and drinking Follow the diet that is recommended by your health care provider. Drink enough fluid to keep your urine pale yellow. If you vomit: Drink water, juice, or soup when you can drink  without vomiting. Make sure you have little or no nausea before eating solid foods. General instructions Have a responsible adult stay with you for the time you are told. It is important to have someone help care for you until you are awake and alert. Take over-the-counter and prescription medicines only as told by your health care provider. If you have sleep apnea, surgery and certain medicines can increase your risk for breathing problems. Follow instructions from your health care provider about wearing your sleep device: Anytime you are sleeping, including during daytime naps. While taking prescription pain medicines, sleeping medicines, or medicines that make you drowsy. Avoid smoking. Keep all follow-up visits as told by your health care provider. This is important. Contact a health care provider if: You keep feeling nauseous or you keep vomiting. You feel light-headed. You are still sleepy or having trouble with balance after 24  hours. You develop a rash. You have a fever. You have redness or swelling around the IV site. Get help right away if: You have trouble breathing. You have new-onset confusion at home. Summary For several hours after your procedure, you may feel tired. You may also be forgetful and have poor judgment. Have a responsible adult stay with you for the time you are told. It is important to have someone help care for you until you are awake and alert. Rest as told. Do not drive or operate machinery. Do not drink alcohol or take sleeping pills. Get help right away if you have trouble breathing, or if you suddenly become confused. This information is not intended to replace advice given to you by your health care provider. Make sure you discuss any questions you have with your health care provider. Document Revised: 07/14/2020 Document Reviewed: 10/01/2019 Elsevier Patient Education  2022 [employer].

## 2021-09-08 ENCOUNTER — Ambulatory Visit (HOSPITAL_COMMUNITY)
Admission: RE | Admit: 2021-09-08 | Discharge: 2021-09-08 | Disposition: A | Discharge status: Home / Self Care | Payer: 59 | Attending: Internal Medicine | Admitting: Internal Medicine

## 2021-09-08 ENCOUNTER — Ambulatory Visit (HOSPITAL_COMMUNITY): Payer: 59 | Admitting: Anesthesiology

## 2021-09-08 ENCOUNTER — Encounter (HOSPITAL_COMMUNITY): Payer: Self-pay | Admitting: Internal Medicine

## 2021-09-08 ENCOUNTER — Encounter (HOSPITAL_COMMUNITY)
Admission: RE | Disposition: A | Discharge status: Home / Self Care | Payer: Self-pay | Source: Home / Self Care | Attending: Internal Medicine

## 2021-09-08 DIAGNOSIS — Z1211 Encounter for screening for malignant neoplasm of colon: Secondary | ICD-10-CM | POA: Insufficient documentation

## 2021-09-08 DIAGNOSIS — D120 Benign neoplasm of cecum: Secondary | ICD-10-CM | POA: Insufficient documentation

## 2021-09-08 DIAGNOSIS — D123 Benign neoplasm of transverse colon: Secondary | ICD-10-CM | POA: Insufficient documentation

## 2021-09-08 DIAGNOSIS — Z885 Allergy status to narcotic agent status: Secondary | ICD-10-CM | POA: Diagnosis not present

## 2021-09-08 DIAGNOSIS — Z79899 Other long term (current) drug therapy: Secondary | ICD-10-CM | POA: Diagnosis not present

## 2021-09-08 DIAGNOSIS — K5730 Diverticulosis of large intestine without perforation or abscess without bleeding: Secondary | ICD-10-CM | POA: Diagnosis not present

## 2021-09-08 DIAGNOSIS — K635 Polyp of colon: Secondary | ICD-10-CM

## 2021-09-08 HISTORY — PX: POLYPECTOMY: SHX5525

## 2021-09-08 HISTORY — PX: COLONOSCOPY WITH PROPOFOL: SHX5780

## 2021-09-08 SURGERY — COLONOSCOPY WITH PROPOFOL
Anesthesia: General

## 2021-09-08 MED ORDER — PROPOFOL 500 MG/50ML IV EMUL
INTRAVENOUS | Status: AC
  Filled 2021-09-08: qty 100

## 2021-09-08 MED ORDER — LACTATED RINGERS IV SOLN: INTRAVENOUS | Status: DC

## 2021-09-08 MED ORDER — PROPOFOL 10 MG/ML IV BOLUS
INTRAVENOUS | Status: DC | PRN
  Administered 2021-09-08: 100 mg via INTRAVENOUS

## 2021-09-08 MED ORDER — PHENYLEPHRINE 40 MCG/ML (10ML) SYRINGE FOR IV PUSH (FOR BLOOD PRESSURE SUPPORT)
PREFILLED_SYRINGE | INTRAVENOUS | Status: AC
  Filled 2021-09-08: qty 30

## 2021-09-08 MED ORDER — PROPOFOL 500 MG/50ML IV EMUL
INTRAVENOUS | Status: DC | PRN
  Administered 2021-09-08: 200 ug/kg/min via INTRAVENOUS

## 2021-09-08 MED ORDER — PHENYLEPHRINE HCL (PRESSORS) 10 MG/ML IV SOLN
INTRAVENOUS | Status: DC | PRN
  Administered 2021-09-08 (×4): 100 ug via INTRAVENOUS

## 2021-09-08 MED ORDER — EPHEDRINE 5 MG/ML INJ
INTRAVENOUS | Status: AC
  Filled 2021-09-08: qty 10

## 2021-09-08 NOTE — Anesthesia Preprocedure Evaluation (Signed)
Anesthesia Evaluation  Patient identified by MRN, date of birth, ID band Patient awake    Reviewed: Allergy & Precautions, NPO status , Patient's Chart, lab work & pertinent test results  History of Anesthesia Complications Negative for: history of anesthetic complications  Airway Mallampati: II  TM Distance: >3 FB Neck ROM: Full    Dental  (+) Dental Advisory Given, Missing, Chipped,    Pulmonary neg pulmonary ROS,    Pulmonary exam normal breath sounds clear to auscultation       Cardiovascular Exercise Tolerance: Good hypertension, Pt. on medications Normal cardiovascular exam Rhythm:Regular Rate:Normal     Neuro/Psych negative neurological ROS  negative psych ROS   GI/Hepatic negative GI ROS, Neg liver ROS,   Endo/Other  Hypothyroidism Morbid obesity  Renal/GU negative Renal ROS     Musculoskeletal  (+) Arthritis ,   Abdominal   Peds  Hematology   Anesthesia Other Findings Back pain.  Reproductive/Obstetrics                             Anesthesia Physical Anesthesia Plan  ASA: 3  Anesthesia Plan: General   Post-op Pain Management:    Induction: Intravenous  PONV Risk Score and Plan: Propofol infusion  Airway Management Planned: Nasal Cannula and Natural Airway  Additional Equipment:   Intra-op Plan:   Post-operative Plan:   Informed Consent: I have reviewed the patients History and Physical, chart, labs and discussed the procedure including the risks, benefits and alternatives for the proposed anesthesia with the patient or authorized representative who has indicated his/her understanding and acceptance.     Dental advisory given  Plan Discussed with: CRNA and Surgeon  Anesthesia Plan Comments:         Anesthesia Quick Evaluation

## 2021-09-08 NOTE — Anesthesia Postprocedure Evaluation (Signed)
Anesthesia Post Note  Patient: Erica Greer  Procedure(s) Performed: COLONOSCOPY WITH PROPOFOL POLYPECTOMY  Patient location during evaluation: PACU Anesthesia Type: General Level of consciousness: awake and alert and oriented Pain management: pain level controlled Vital Signs Assessment: post-procedure vital signs reviewed and stable Respiratory status: spontaneous breathing, nonlabored ventilation and respiratory function stable Cardiovascular status: blood pressure returned to baseline and stable Postop Assessment: no apparent nausea or vomiting Anesthetic complications: no   No notable events documented.   Last Vitals:  Vitals:   09/08/21 1051 09/08/21 1145  BP: (!) 103/49 126/89  Pulse: 69 73  Resp: 18 (!) 22  Temp: 36.7 C 36.9 C  SpO2: 99% 94%    Last Pain:  Vitals:   09/08/21 1145  TempSrc: Oral  PainSc: 0-No pain                 Sofie Schendel C Audrea Bolte

## 2021-09-08 NOTE — Transfer of Care (Signed)
Immediate Anesthesia Transfer of Care Note  Patient: Erica Greer  Procedure(s) Performed: COLONOSCOPY WITH PROPOFOL POLYPECTOMY  Patient Location: PACU  Anesthesia Type:General  Level of Consciousness: awake, alert , oriented and patient cooperative  Airway & Oxygen Therapy: Patient Spontanous Breathing  Post-op Assessment: Report given to RN, Post -op Vital signs reviewed and stable and Patient moving all extremities X 4  Post vital signs: Reviewed and stable  Last Vitals:  Vitals Value Taken Time  BP    Temp    Pulse    Resp    SpO2      Last Pain:  Vitals:   09/08/21 1051  TempSrc: Oral  PainSc: 0-No pain      Patients Stated Pain Goal: 5 (94/99/71 8209)  Complications: No notable events documented.

## 2021-09-08 NOTE — Discharge Instructions (Signed)
  Colonoscopy Discharge Instructions  Read the instructions outlined below and refer to this sheet in the next few weeks. These discharge instructions provide you with general information on caring for yourself after you leave the hospital. Your doctor may also give you specific instructions. While your treatment has been planned according to the most current medical practices available, unavoidable complications occasionally occur. If you have any problems or questions after discharge, call Dr. Gala Romney at [phone]. ACTIVITY You may resume your regular activity, but move at a slower pace for the next 24 hours.  Take frequent rest periods for the next 24 hours.  Walking will help get rid of the air and reduce the bloated feeling in your belly (abdomen).  No driving for 24 hours (because of the medicine (anesthesia) used during the test).   Do not sign any important legal documents or operate any machinery for 24 hours (because of the anesthesia used during the test).  NUTRITION Drink plenty of fluids.  You may resume your normal diet as instructed by your doctor.  Begin with a light meal and progress to your normal diet. Heavy or fried foods are harder to digest and may make you feel sick to your stomach (nauseated).  Avoid alcoholic beverages for 24 hours or as instructed.  MEDICATIONS You may resume your normal medications unless your doctor tells you otherwise.  WHAT YOU CAN EXPECT TODAY Some feelings of bloating in the abdomen.  Passage of more gas than usual.  Spotting of blood in your stool or on the toilet paper.  IF YOU HAD POLYPS REMOVED DURING THE COLONOSCOPY: No aspirin products for 7 days or as instructed.  No alcohol for 7 days or as instructed.  Eat a soft diet for the next 24 hours.  FINDING OUT THE RESULTS OF YOUR TEST Not all test results are available during your visit. If your test results are not back during the visit, make an appointment with your caregiver to find out the  results. Do not assume everything is normal if you have not heard from your caregiver or the medical facility. It is important for you to follow up on all of your test results.  SEEK IMMEDIATE MEDICAL ATTENTION IF: You have more than a spotting of blood in your stool.  Your belly is swollen (abdominal distention).  You are nauseated or vomiting.  You have a temperature over 101.  You have abdominal pain or discomfort that is severe or gets worse throughout the day.    3 polyps removed from your colon today   further recommendations to follow pending review of pathology report   at patient request, I called Sheila Oats at [phone] to make contact

## 2021-09-08 NOTE — H&P (Signed)
@LOGO @   Primary Care Physician:  Lindell Spar, MD Primary Gastroenterologist:  Dr. Gala Romney  Pre-Procedure History & Physical: HPI:  Erica Greer is a 55 y.o. female is here for a screening colonoscopy.  first ever average risk screening examination  Past Medical History:  Diagnosis Date   Arthritis    Phreesia 01/16/2021   Chronic knee pain    HLD (hyperlipidemia)    HTN (hypertension)    Ovarian cyst    right    Subclinical hypothyroidism     Past Surgical History:  Procedure Laterality Date   LAPAROSCOPIC SALPINGO OOPHERECTOMY Right 10/04/2015   Procedure: LAPAROSCOPIC RIGHT SALPINGO OOPHORECTOMY;  Surgeon: Jonnie Kind, MD;  Location: [facility];  Service: Gynecology;  Laterality: Right;   LAPAROSCOPIC UNILATERAL SALPINGECTOMY Left 10/04/2015   Procedure: LAPAROSCOPIC LEFT SALPINGECTOMY;  Surgeon: Jonnie Kind, MD;  Location: [facility];  Service: Gynecology;  Laterality: Left;   OVARIAN CYST REMOVAL Left    TUBAL LIGATION Left    Unilateral    Prior to Admission medications   Medication Sig Start Date End Date Taking? Authorizing Provider  atorvastatin (LIPITOR) 20 MG tablet Take 1 tablet (20 mg total) by mouth daily. 07/19/21  Yes Lindell Spar, MD  docusate sodium (COLACE) 100 MG capsule Take 1 capsule (100 mg total) by mouth daily as needed for mild constipation. 07/31/21  Yes Lindell Spar, MD  hydrocortisone (ANUSOL-HC) 2.5 % rectal cream Place 1 application rectally 2 (two) times daily. As needed 07/31/21  Yes Lindell Spar, MD  ondansetron (ZOFRAN) 4 MG tablet Take 1 tablet (4 mg total) by mouth every 8 (eight) hours as needed for nausea or vomiting. 03/28/21  Yes Lindell Spar, MD  polyethylene glycol-electrolytes (TRILYTE) 420 g solution Take 4,000 mLs by mouth as directed. 07/27/21  Yes Kerrie Latour, Cristopher Estimable, MD  telmisartan-hydrochlorothiazide (MICARDIS HCT) 40-12.5 MG tablet Take 1 tablet by mouth daily. 07/18/21  Yes Lindell Spar, MD  Vitamin D,  Ergocalciferol, (DRISDOL) 1.25 MG (50000 UNIT) CAPS capsule Take 1 capsule (50,000 Units total) by mouth every 7 (seven) days. 07/18/21  Yes Lindell Spar, MD    Allergies as of 07/27/2021 - Review Complete 07/27/2021  Allergen Reaction Noted   Hydrocodone Nausea And Vomiting 07/09/2011    Family History  Problem Relation Age of Onset   Diabetes Mother    Cancer Father    Seizures Sister    Colon cancer Neg Hx    Colon polyps Neg Hx     Social History   Socioeconomic History   Marital status: Legally Separated    Spouse name: Not on file   Number of children: Not on file   Years of education: Not on file   Highest education level: Not on file  Occupational History   Not on file  Tobacco Use   Smoking status: Never   Smokeless tobacco: Never  Substance and Sexual Activity   Alcohol use: No   Drug use: No   Sexual activity: Yes    Birth control/protection: Surgical  Other Topics Concern   Not on file  Social History Narrative   Not on file   Social Determinants of Health   Financial Resource Strain: Not on file  Food Insecurity: Not on file  Transportation Needs: Not on file  Physical Activity: Not on file  Stress: Not on file  Social Connections: Not on file  Intimate Partner Violence: Not on file    Review of Systems: See  HPI, otherwise negative ROS  Physical Exam: BP (!) 103/49   Pulse 69   Temp 98.1 F (36.7 C) (Oral)   Resp 18   LMP 02/11/2015   SpO2 99%  General:   Alert,  Well-developed, well-nourished, pleasant and cooperative in NAD Head:  Normocephalic and atraumatic. Heart:  Regular rate and rhythm; no murmurs, clicks, rubs,  or gallops. Abdomen:  Soft, nontender and nondistended. No masses, hepatosplenomegaly or hernias noted. Normal bowel sounds, without guarding, and without rebound.   Impression/Plan: [name] is now here to undergo a screening colonoscopy.   first ever average risk screening examination  Risks, benefits,  limitations, imponderables and alternatives regarding colonoscopy have been reviewed with the patient. Questions have been answered. All parties agreeable.     Notice:  This dictation was prepared with Dragon dictation along with smaller phrase technology. Any transcriptional errors that result from this process are unintentional and may not be corrected upon review.

## 2021-09-08 NOTE — Op Note (Signed)
[facility] Patient Name: Erica Greer Procedure Date: 09/08/2021 11:19 AM MRN: [id number] Date of Birth: December 12, 1965 Attending MD: Norvel Richards , MD CSN: [id number] Age: 55 Admit Type: Outpatient Procedure:                Colonoscopy Indications:              Screening for colorectal malignant neoplasm Providers:                Norvel Richards, MD, Lurline Del, RN, Raphael Gibney, Technician Referring MD:              Medicines:                Propofol per Anesthesia Complications:            No immediate complications. Estimated Blood Loss:     Estimated blood loss was minimal. Estimated blood                            loss was minimal. Procedure:                Pre-Anesthesia Assessment:                           - Prior to the procedure, a History and Physical                            was performed, and patient medications and                            allergies were reviewed. The patient's tolerance of                            previous anesthesia was also reviewed. The risks                            and benefits of the procedure and the sedation                            options and risks were discussed with the patient.                            All questions were answered, and informed consent                            was obtained. Prior Anticoagulants: The patient has                            taken no previous anticoagulant or antiplatelet                            agents. ASA Grade Assessment: III - A patient with  severe systemic disease. After reviewing the risks                            and benefits, the patient was deemed in                            satisfactory condition to undergo the procedure.                           After obtaining informed consent, the colonoscope                            was passed under direct vision. Throughout the                            procedure,  the patient's blood pressure, pulse, and                            oxygen saturations were monitored continuously. The                            [id number]) scope was introduced through the                            anus and advanced to the the cecum, identified by                            appendiceal orifice and ileocecal valve. The                            colonoscopy was performed without difficulty. The                            patient tolerated the procedure well. The quality                            of the bowel preparation was adequate. Scope In: 11:28:56 AM Scope Out: 11:40:48 AM Scope Withdrawal Time: 0 hours 8 minutes 35 seconds  Total Procedure Duration: 0 hours 11 minutes 52 seconds  Findings:      The perianal and digital rectal examinations were normal.      Two semi-sessile polyps were found in the hepatic flexure and cecum. The       polyps were 4 to 6 mm in size. These polyps were removed with a cold       snare. Resection and retrieval were complete. Estimated blood loss was       minimal.      A 2 mm polyp was found in the cecum. The polyp was sessile. The polyp       was removed with a cold biopsy forceps. Resection and retrieval were       complete. Estimated blood loss was minimal.      A few small-mouthed diverticula were found in the entire colon.      The exam was otherwise without abnormality on direct and retroflexion       views. Impression:               -  Two 4 to 6 mm polyps at the hepatic flexure and                            in the cecum, removed with a cold snare. Resected                            and retrieved.                           - One 2 mm polyp in the cecum, removed with a cold                            biopsy forceps. Resected and retrieved.                           - Diverticulosis in the entire examined colon.                           - The examination was otherwise normal on direct                            and  retroflexion views. Moderate Sedation:      Moderate (conscious) sedation was personally administered by an       anesthesia professional. The following parameters were monitored: oxygen       saturation, heart rate, blood pressure, respiratory rate, EKG, adequacy       of pulmonary ventilation, and response to care. Recommendation:           - Patient has a contact number available for                            emergencies. The signs and symptoms of potential                            delayed complications were discussed with the                            patient. Return to normal activities tomorrow.                            Written discharge instructions were provided to the                            patient.                           - Resume previous diet.                           - Continue present medications.                           - Repeat colonoscopy after studies are complete for  surveillance.                           - Return to GI office (date not yet determined). Procedure Code(s):        --- Professional ---                           [phone], Colonoscopy, flexible; with removal of                            tumor(s), polyp(s), or other lesion(s) by snare                            technique                           45380, 22, Colonoscopy, flexible; with biopsy,                            single or multiple Diagnosis Code(s):        --- Professional ---                           K63.5, Polyp of colon                           Z12.11, Encounter for screening for malignant                            neoplasm of colon                           K57.30, Diverticulosis of large intestine without                            perforation or abscess without bleeding CPT copyright 2019 American Medical Association. All rights reserved. The codes documented in this report are preliminary and upon coder review may  be revised to meet current  compliance requirements. Cristopher Estimable. Huntington Leverich, MD Norvel Richards, MD 09/08/2021 11:49:09 AM This report has been signed electronically. Number of Addenda: 0

## 2021-09-11 LAB — SURGICAL PATHOLOGY

## 2021-09-12 ENCOUNTER — Encounter (HOSPITAL_COMMUNITY): Payer: Self-pay | Admitting: Internal Medicine

## 2021-09-13 ENCOUNTER — Encounter: Payer: Self-pay | Admitting: Internal Medicine

## 2021-09-18 ENCOUNTER — Other Ambulatory Visit: Payer: Self-pay | Admitting: Internal Medicine

## 2021-09-18 ENCOUNTER — Encounter: Payer: Self-pay | Admitting: Internal Medicine

## 2021-09-18 ENCOUNTER — Telehealth: Payer: Self-pay | Admitting: Internal Medicine

## 2021-09-18 DIAGNOSIS — I10 Essential (primary) hypertension: Secondary | ICD-10-CM

## 2021-09-18 NOTE — Telephone Encounter (Signed)
Pt is calling to get her blood pressure medication refilled, I advised we needed the name.  She will call back

## 2021-10-25 ENCOUNTER — Telehealth: Payer: 59 | Admitting: Physician Assistant

## 2021-10-25 DIAGNOSIS — M545 Low back pain, unspecified: Secondary | ICD-10-CM

## 2021-10-25 MED ORDER — METHYLPREDNISOLONE 4 MG PO TBPK: ORAL_TABLET | ORAL | 0 refills | Status: DC

## 2021-10-25 MED ORDER — CYCLOBENZAPRINE HCL 10 MG PO TABS: 10.0000 mg | ORAL_TABLET | Freq: Three times a day (TID) | ORAL | 0 refills | Status: DC | PRN

## 2021-10-25 NOTE — Progress Notes (Signed)
Virtual Visit Consent   Erica Greer, you are scheduled for a virtual visit with a [facility] provider today.     Just as with appointments in the office, your consent must be obtained to participate.  Your consent will be active for this visit and any virtual visit you may have with one of our providers in the next 365 days.     If you have a MyChart account, a copy of this consent can be sent to you electronically.  All virtual visits are billed to your insurance company just like a traditional visit in the office.    As this is a virtual visit, video technology does not allow for your provider to perform a traditional examination.  This may limit your provider's ability to fully assess your condition.  If your provider identifies any concerns that need to be evaluated in person or the need to arrange testing (such as labs, EKG, etc.), we will make arrangements to do so.     Although advances in technology are sophisticated, we cannot ensure that it will always work on either your end or our end.  If the connection with a video visit is poor, the visit may have to be switched to a telephone visit.  With either a video or telephone visit, we are not always able to ensure that we have a secure connection.     I need to obtain your verbal consent now.   Are you willing to proceed with your visit today?    LAWAN NANEZ has provided verbal consent on 10/25/2021 for a virtual visit (video or telephone).   Mar Daring, PA-C   Date: 10/25/2021 6:49 PM   Virtual Visit via Video Note   I, Mar Daring, connected with  Erica Greer  ([id number], 07-15-1966) on 10/25/21 at  6:45 PM EST by a video-enabled telemedicine application and verified that I am speaking with the correct person using two identifiers.  Location: Patient: Virtual Visit Location Patient: Other: work; isolated Provider: Scientist, research (medical) Provider: Home Office   I discussed the limitations of  evaluation and management by telemedicine and the availability of in person appointments. The patient expressed understanding and agreed to proceed.    History of Present Illness: Erica Greer is a 55 y.o. who identifies as a female who was assigned female at birth, and is being seen today for back pain.  HPI: Back Pain This is a new problem. The current episode started yesterday. The problem occurs intermittently. The problem has been waxing and waning since onset. The pain is present in the lumbar spine. The quality of the pain is described as aching. The pain does not radiate. The pain is at a severity of 9/10. The pain is severe. The pain is The same all the time. The symptoms are aggravated by twisting, sitting and lying down. Stiffness is present All day. Pertinent negatives include no bladder incontinence, bowel incontinence, dysuria, fever, headaches, leg pain, numbness, paresis, paresthesias, pelvic pain, tingling or weakness. Treatments tried: tylenol, ibuprofen, aleve, salonpas patches, icy hot. The treatment provided no relief.     Problems:  Patient Active Problem List   Diagnosis Date Noted   Lower abdominal pain 07/27/2021   Colon cancer screening 07/27/2021   Subclinical hypothyroidism 07/18/2021   Vitamin D deficiency 07/18/2021   Rectal pain 05/19/2021   Annual physical exam 03/15/2021   Primary hypertension 01/18/2021   Osteoarthritis of left knee 01/18/2021   Cervical spondylosis  01/18/2021   Chronic low back pain 01/18/2021   Morbid obesity ([place]) 01/18/2021   Mass of right ovary 09/16/2015    Allergies:  Allergies  Allergen Reactions   Hydrocodone Nausea And Vomiting   Medications:  Current Outpatient Medications:    atorvastatin (LIPITOR) 20 MG tablet, Take 1 tablet (20 mg total) by mouth daily., Disp: 90 tablet, Rfl: 1   cyclobenzaprine (FLEXERIL) 10 MG tablet, Take 1 tablet (10 mg total) by mouth 3 (three) times daily as needed for muscle spasms., Disp: 30  tablet, Rfl: 0   docusate sodium (COLACE) 100 MG capsule, Take 1 capsule (100 mg total) by mouth daily as needed for mild constipation., Disp: 30 capsule, Rfl: 0   hydrocortisone (ANUSOL-HC) 2.5 % rectal cream, Place 1 application rectally 2 (two) times daily. As needed, Disp: 28 g, Rfl: 0   methylPREDNISolone (MEDROL DOSEPAK) 4 MG TBPK tablet, 6 day taper; take as directed package instructions, Disp: 21 tablet, Rfl: 0   ondansetron (ZOFRAN) 4 MG tablet, Take 1 tablet (4 mg total) by mouth every 8 (eight) hours as needed for nausea or vomiting., Disp: 20 tablet, Rfl: 0   polyethylene glycol-electrolytes (TRILYTE) 420 g solution, Take 4,000 mLs by mouth as directed., Disp: 4000 mL, Rfl: 0   telmisartan-hydrochlorothiazide (MICARDIS HCT) 40-12.5 MG tablet, TAKE 1 TABLET BY MOUTH DAILY, Disp: 30 tablet, Rfl: 5   Vitamin D, Ergocalciferol, (DRISDOL) 1.25 MG (50000 UNIT) CAPS capsule, Take 1 capsule (50,000 Units total) by mouth every 7 (seven) days., Disp: 5 capsule, Rfl: 5  Observations/Objective: Patient is well-developed, well-nourished in no acute distress.  Resting comfortably  Head is normocephalic, atraumatic.  No labored breathing.  Speech is clear and coherent with logical content.  Patient is alert and oriented at baseline.    Assessment and Plan: 1. Acute midline low back pain without sciatica - cyclobenzaprine (FLEXERIL) 10 MG tablet; Take 1 tablet (10 mg total) by mouth 3 (three) times daily as needed for muscle spasms.  Dispense: 30 tablet; Refill: 0 - methylPREDNISolone (MEDROL DOSEPAK) 4 MG TBPK tablet; 6 day taper; take as directed package instructions  Dispense: 21 tablet; Refill: 0  - Suspect low back strain - Medrol dose pak and flexeril prescribed - Heat - Back exercises and stretches provided via AVS - Seek in person evaluation if not improving or if symptoms worsen  Follow Up Instructions: I discussed the assessment and treatment plan with the patient. The patient was  provided an opportunity to ask questions and all were answered. The patient agreed with the plan and demonstrated an understanding of the instructions.  A copy of instructions were sent to the patient via MyChart unless otherwise noted below.    The patient was advised to call back or seek an in-person evaluation if the symptoms worsen or if the condition fails to improve as anticipated.  Time:  I spent 10 minutes with the patient via telehealth technology discussing the above problems/concerns.    Mar Daring, PA-C

## 2021-10-25 NOTE — Patient Instructions (Signed)
Erica Greer, thank you for joining Mar Daring, PA-C for today's virtual visit.  While this provider is not your primary care provider (PCP), if your PCP is located in our provider database this encounter information will be shared with them immediately following your visit.  Consent: (Patient) Erica Greer provided verbal consent for this virtual visit at the beginning of the encounter.  Current Medications:  Current Outpatient Medications:    atorvastatin (LIPITOR) 20 MG tablet, Take 1 tablet (20 mg total) by mouth daily., Disp: 90 tablet, Rfl: 1   cyclobenzaprine (FLEXERIL) 10 MG tablet, Take 1 tablet (10 mg total) by mouth 3 (three) times daily as needed for muscle spasms., Disp: 30 tablet, Rfl: 0   docusate sodium (COLACE) 100 MG capsule, Take 1 capsule (100 mg total) by mouth daily as needed for mild constipation., Disp: 30 capsule, Rfl: 0   hydrocortisone (ANUSOL-HC) 2.5 % rectal cream, Place 1 application rectally 2 (two) times daily. As needed, Disp: 28 g, Rfl: 0   methylPREDNISolone (MEDROL DOSEPAK) 4 MG TBPK tablet, 6 day taper; take as directed package instructions, Disp: 21 tablet, Rfl: 0   ondansetron (ZOFRAN) 4 MG tablet, Take 1 tablet (4 mg total) by mouth every 8 (eight) hours as needed for nausea or vomiting., Disp: 20 tablet, Rfl: 0   polyethylene glycol-electrolytes (TRILYTE) 420 g solution, Take 4,000 mLs by mouth as directed., Disp: 4000 mL, Rfl: 0   telmisartan-hydrochlorothiazide (MICARDIS HCT) 40-12.5 MG tablet, TAKE 1 TABLET BY MOUTH DAILY, Disp: 30 tablet, Rfl: 5   Vitamin D, Ergocalciferol, (DRISDOL) 1.25 MG (50000 UNIT) CAPS capsule, Take 1 capsule (50,000 Units total) by mouth every 7 (seven) days., Disp: 5 capsule, Rfl: 5   Medications ordered in this encounter:  Meds ordered this encounter  Medications   cyclobenzaprine (FLEXERIL) 10 MG tablet    Sig: Take 1 tablet (10 mg total) by mouth 3 (three) times daily as needed for muscle spasms.     Dispense:  30 tablet    Refill:  0    Order Specific Question:   Supervising Provider    Answer:   MILLER, BRIAN [[id number]]   methylPREDNISolone (MEDROL DOSEPAK) 4 MG TBPK tablet    Sig: 6 day taper; take as directed package instructions    Dispense:  21 tablet    Refill:  0    Order Specific Question:   Supervising Provider    Answer:   Sabra Heck, BRIAN [[id number]]     *If you need refills on other medications prior to your next appointment, please contact your pharmacy*  Follow-Up: Call back or seek an in-person evaluation if the symptoms worsen or if the condition fails to improve as anticipated.  Other Instructions Back Exercises These exercises help to make your trunk and back strong. They also help to keep the lower back flexible. Doing these exercises can help to prevent or lessen pain in your lower back. If you have back pain, try to do these exercises 2-3 times each day or as told by your doctor. As you get better, do the exercises once each day. Repeat the exercises more often as told by your doctor. To stop back pain from coming back, do the exercises once each day, or as told by your doctor. Do exercises exactly as told by your doctor. Stop right away if you feel sudden pain or your pain gets worse. Exercises Single knee to chest Do these steps 3-5 times in a row for each leg: Lie on  your back on a firm bed or the floor with your legs stretched out. Bring one knee to your chest. Grab your knee or thigh with both hands and hold it in place. Pull on your knee until you feel a gentle stretch in your lower back or butt. Keep doing the stretch for 10-30 seconds. Slowly let go of your leg and straighten it. Pelvic tilt Do these steps 5-10 times in a row: Lie on your back on a firm bed or the floor with your legs stretched out. Bend your knees so they point up to the ceiling. Your feet should be flat on the floor. Tighten your lower belly (abdomen) muscles to press your lower back  against the floor. This will make your tailbone point up to the ceiling instead of pointing down to your feet or the floor. Stay in this position for 5-10 seconds while you gently tighten your muscles and breathe evenly. Cat-cow Do these steps until your lower back bends more easily: Get on your hands and knees on a firm bed or the floor. Keep your hands under your shoulders, and keep your knees under your hips. You may put padding under your knees. Let your head hang down toward your chest. Tighten (contract) the muscles in your belly. Point your tailbone toward the floor so your lower back becomes rounded like the back of a cat. Stay in this position for 5 seconds. Slowly lift your head. Let the muscles of your belly relax. Point your tailbone up toward the ceiling so your back forms a sagging arch like the back of a cow. Stay in this position for 5 seconds.  Press-ups Do these steps 5-10 times in a row: Lie on your belly (face-down) on a firm bed or the floor. Place your hands near your head, about shoulder-width apart. While you keep your back relaxed and keep your hips on the floor, slowly straighten your arms to raise the top half of your body and lift your shoulders. Do not use your back muscles. You may change where you place your hands to make yourself more comfortable. Stay in this position for 5 seconds. Keep your back relaxed. Slowly return to lying flat on the floor.  Bridges Do these steps 10 times in a row: Lie on your back on a firm bed or the floor. Bend your knees so they point up to the ceiling. Your feet should be flat on the floor. Your arms should be flat at your sides, next to your body. Tighten your butt muscles and lift your butt off the floor until your waist is almost as high as your knees. If you do not feel the muscles working in your butt and the back of your thighs, slide your feet 1-2 inches (2.5-5 cm) farther away from your butt. Stay in this position for 3-5  seconds. Slowly lower your butt to the floor, and let your butt muscles relax. If this exercise is too easy, try doing it with your arms crossed over your chest. Belly crunches Do these steps 5-10 times in a row: Lie on your back on a firm bed or the floor with your legs stretched out. Bend your knees so they point up to the ceiling. Your feet should be flat on the floor. Cross your arms over your chest. Tip your chin a little bit toward your chest, but do not bend your neck. Tighten your belly muscles and slowly raise your chest just enough to lift your shoulder blades a  tiny bit off the floor. Avoid raising your body higher than that because it can put too much stress on your lower back. Slowly lower your chest and your head to the floor. Back lifts Do these steps 5-10 times in a row: Lie on your belly (face-down) with your arms at your sides, and rest your forehead on the floor. Tighten the muscles in your legs and your butt. Slowly lift your chest off the floor while you keep your hips on the floor. Keep the back of your head in line with the curve in your back. Look at the floor while you do this. Stay in this position for 3-5 seconds. Slowly lower your chest and your face to the floor. Contact a doctor if: Your back pain gets a lot worse when you do an exercise. Your back pain does not get better within 2 hours after you exercise. If you have any of these problems, stop doing the exercises. Do not do them again unless your doctor says it is okay. Get help right away if: You have sudden, very bad back pain. If this happens, stop doing the exercises. Do not do them again unless your doctor says it is okay. This information is not intended to replace advice given to you by your health care provider. Make sure you discuss any questions you have with your health care provider. Document Revised: 01/11/2021 Document Reviewed: 01/11/2021 Elsevier Patient Education  2022 [employer].    If you have been instructed to have an in-person evaluation today at a local Urgent Care facility, please use the link below. It will take you to a list of all of our available [facility], including address, phone number and hours of operation. Please do not delay care.  [facility]  If you or a family member do not have a primary care provider, use the link below to schedule a visit and establish care. When you choose a [facility] primary care physician or advanced practice provider, you gain a long-term partner in health. Find a Primary Care Provider  Learn more about [facility]'s in-office and virtual care options: [facility]

## 2021-11-17 ENCOUNTER — Other Ambulatory Visit: Payer: Self-pay

## 2021-11-17 ENCOUNTER — Encounter: Payer: Self-pay | Admitting: Internal Medicine

## 2021-11-17 ENCOUNTER — Ambulatory Visit: Payer: 59 | Admitting: Internal Medicine

## 2021-11-17 VITALS — BP 124/75 | HR 71 | Ht 63.0 in | Wt 251.0 lb

## 2021-11-17 DIAGNOSIS — Z23 Encounter for immunization: Secondary | ICD-10-CM | POA: Diagnosis not present

## 2021-11-17 DIAGNOSIS — E038 Other specified hypothyroidism: Secondary | ICD-10-CM | POA: Diagnosis not present

## 2021-11-17 DIAGNOSIS — I10 Essential (primary) hypertension: Secondary | ICD-10-CM | POA: Diagnosis not present

## 2021-11-17 DIAGNOSIS — N1831 Chronic kidney disease, stage 3a: Secondary | ICD-10-CM

## 2021-11-17 DIAGNOSIS — F5101 Primary insomnia: Secondary | ICD-10-CM

## 2021-11-17 MED ORDER — TELMISARTAN-HCTZ 40-12.5 MG PO TABS: 1.0000 | ORAL_TABLET | Freq: Every day | ORAL | 1 refills | Status: DC

## 2021-11-17 MED ORDER — HYDROXYZINE PAMOATE 25 MG PO CAPS: 25.0000 mg | ORAL_CAPSULE | Freq: Every evening | ORAL | 2 refills | Status: AC | PRN

## 2021-11-17 NOTE — Assessment & Plan Note (Signed)
Diet modification and moderate exercise advised - lost 15 lbs since last visit Will discuss about Bariatric surgery option later.

## 2021-11-17 NOTE — Assessment & Plan Note (Addendum)
Could due to late shifts Started Vistaril PRN Sleep hygiene discussed, material provided

## 2021-11-17 NOTE — Patient Instructions (Signed)
Please start taking Hydroxyzine as needed for insomnia.  Please maintain simple sleep hygiene. - Maintain dark and non-noisy environment in the bedroom. - Please use the bedroom for sleep and sexual activity only. - Do not use electronic devices in the bedroom. - Please take dinner at least 2 hours before bedtime. - Please avoid caffeinated products in the evening, including coffee, soft drinks. - Please try to maintain the regular sleep-wake cycle - Go to bed and wake up at the same time.  Please continue to take other medications as prescribed.

## 2021-11-17 NOTE — Assessment & Plan Note (Signed)
BP Readings from Last 1 Encounters:  11/17/21 124/75   Well-controlled with Telmisartan-HCTZ Counseled for compliance with the medications Advised DASH diet and moderate exercise/walking, at least 150 mins/week

## 2021-11-17 NOTE — Assessment & Plan Note (Signed)
Stable, last BMP reviewed Will recheck BMP, check PTH Advised to maintain adequate hydration Avoid nephrotoxic agents On ARB

## 2021-11-17 NOTE — Assessment & Plan Note (Signed)
Lab Results  Component Value Date   TSH 3.420 07/18/2021   Improved now Will recheck TSH and free T4 later

## 2021-11-17 NOTE — Progress Notes (Signed)
Established Patient Office Visit  Subjective:  Patient ID: Erica Greer, female    DOB: 1966-10-01  Age: 56 y.o. MRN: [id number]  CC:  Chief Complaint  Patient presents with   Follow-up    4 month follow up, trouble sleeping and staying asleep.    HPI Erica Greer is a 56 y.o. female with past medical history of HTN, chronic low back pain, OA of left knee and morbid obesity who presents for f/u of her chronic medical conditions.  BP is well-controlled. Takes medications regularly. Patient denies headache, dizziness, chest pain, dyspnea or palpitations.  She has been losing weight with DASH diet, lost about 15 lbs since last visit.  She c/o insomnia, especially maintaining sleep. 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 She gets up at 3 AM and has difficulty falling asleep after it. She denies anhedonia or excessive stress/anxiety.  She received flu and first dose of Shingrix vaccine in the office today.  Past Medical History:  Diagnosis Date   Arthritis    Phreesia 01/16/2021   Chronic knee pain    HLD (hyperlipidemia)    HTN (hypertension)    Ovarian cyst    right    Subclinical hypothyroidism     Past Surgical History:  Procedure Laterality Date   COLONOSCOPY WITH PROPOFOL N/A 09/08/2021   Procedure: COLONOSCOPY WITH PROPOFOL;  Surgeon: Daneil Dolin, MD;  Location: [facility];  Service: Endoscopy;  Laterality: N/A;  12:45pm   LAPAROSCOPIC SALPINGO OOPHERECTOMY Right 10/04/2015   Procedure: LAPAROSCOPIC RIGHT SALPINGO OOPHORECTOMY;  Surgeon: Jonnie Kind, MD;  Location: [facility];  Service: Gynecology;  Laterality: Right;   LAPAROSCOPIC UNILATERAL SALPINGECTOMY Left 10/04/2015   Procedure: LAPAROSCOPIC LEFT SALPINGECTOMY;  Surgeon: Jonnie Kind, MD;  Location: [facility];  Service: Gynecology;  Laterality: Left;   OVARIAN CYST REMOVAL Left    POLYPECTOMY  09/08/2021   Procedure: POLYPECTOMY;  Surgeon: Daneil Dolin, MD;  Location:  [facility];  Service: Endoscopy;;  cecal; hepatic flexure   TUBAL LIGATION Left    Unilateral    Family History  Problem Relation Age of Onset   Diabetes Mother    Cancer Father    Seizures Sister    Colon cancer Neg Hx    Colon polyps Neg Hx     Social History   Socioeconomic History   Marital status: Legally Separated    Spouse name: Not on file   Number of children: Not on file   Years of education: Not on file   Highest education level: Not on file  Occupational History   Not on file  Tobacco Use   Smoking status: Never   Smokeless tobacco: Never  Substance and Sexual Activity   Alcohol use: No   Drug use: No   Sexual activity: Yes    Birth control/protection: Surgical  Other Topics Concern   Not on file  Social History Narrative   Not on file   Social Determinants of Health   Financial Resource Strain: Not on file  Food Insecurity: Not on file  Transportation Needs: Not on file  Physical Activity: Not on file  Stress: Not on file  Social Connections: Not on file  Intimate Partner Violence: Not on file    Outpatient Medications Prior to Visit  Medication Sig Dispense Refill   atorvastatin (LIPITOR) 20 MG tablet Take 1 tablet (20 mg total) by mouth daily. 90 tablet 1  cyclobenzaprine (FLEXERIL) 10 MG tablet Take 1 tablet (10 mg total) by mouth 3 (three) times daily as needed for muscle spasms. 30 tablet 0   docusate sodium (COLACE) 100 MG capsule Take 1 capsule (100 mg total) by mouth daily as needed for mild constipation. 30 capsule 0   hydrocortisone (ANUSOL-HC) 2.5 % rectal cream Place 1 application rectally 2 (two) times daily. As needed 28 g 0   ondansetron (ZOFRAN) 4 MG tablet Take 1 tablet (4 mg total) by mouth every 8 (eight) hours as needed for nausea or vomiting. 20 tablet 0   polyethylene glycol-electrolytes (TRILYTE) 420 g solution Take 4,000 mLs by mouth as directed. 4000 mL 0   Vitamin D, Ergocalciferol, (DRISDOL) 1.25 MG (50000 UNIT) CAPS  capsule Take 1 capsule (50,000 Units total) by mouth every 7 (seven) days. 5 capsule 5   methylPREDNISolone (MEDROL DOSEPAK) 4 MG TBPK tablet 6 day taper; take as directed package instructions 21 tablet 0   telmisartan-hydrochlorothiazide (MICARDIS HCT) 40-12.5 MG tablet TAKE 1 TABLET BY MOUTH DAILY 30 tablet 5   No facility-administered medications prior to visit.    Allergies  Allergen Reactions   Hydrocodone Nausea And Vomiting    ROS Review of Systems  Constitutional:  Negative for chills and fever.  Respiratory:  Negative for cough and shortness of breath.   Cardiovascular:  Negative for chest pain and palpitations.  Gastrointestinal:  Positive for constipation. Negative for nausea and vomiting.  Genitourinary:  Negative for dysuria and hematuria.  Musculoskeletal:  Positive for back pain. Negative for neck pain and neck stiffness.  Skin:  Negative for rash.  Neurological:  Negative for dizziness and weakness.  Psychiatric/Behavioral:  Positive for sleep disturbance. Negative for agitation and behavioral problems.      Objective:    Physical Exam Vitals reviewed.  Constitutional:      General: She is not in acute distress.    Appearance: She is obese. She is not diaphoretic.  HENT:     Head: Normocephalic and atraumatic.     Nose: Nose normal.     Mouth/Throat:     Mouth: Mucous membranes are moist.  Eyes:     General: No scleral icterus.    Extraocular Movements: Extraocular movements intact.  Cardiovascular:     Rate and Rhythm: Normal rate and regular rhythm.     Pulses: Normal pulses.     Heart sounds: Normal heart sounds. No murmur heard. Pulmonary:     Breath sounds: Normal breath sounds. No wheezing or rales.  Musculoskeletal:        General: Tenderness (Lumbar spine area) present.     Cervical back: Neck supple. No tenderness.     Right lower leg: No edema.     Left lower leg: No edema.  Skin:    General: Skin is warm.     Findings: No rash.   Neurological:     General: No focal deficit present.     Mental Status: She is alert and oriented to person, place, and time.  Psychiatric:        Mood and Affect: Mood normal.        Behavior: Behavior normal.    BP 124/75    Pulse 71    Ht $R'5\' 3"'Bm$  (1.6 m)    Wt 251 lb 0.6 oz (113.9 kg)    LMP 02/11/2015    SpO2 97%    BMI 44.47 kg/m  Wt Readings from Last 3 Encounters:  11/17/21 251 lb 0.6 oz (  113.9 kg)  09/06/21 (P) 266 lb (120.7 kg)  07/31/21 264 lb 1.3 oz (119.8 kg)    Lab Results  Component Value Date   TSH 3.420 07/18/2021   Lab Results  Component Value Date   WBC 9.0 12/28/2020   HGB 12.4 12/28/2020   HCT 40.2 12/28/2020   MCV 80.1 12/28/2020   PLT 218 12/28/2020   Lab Results  Component Value Date   NA 142 07/18/2021   K 4.3 07/18/2021   CO2 23 07/18/2021   GLUCOSE 79 07/18/2021   BUN 17 07/18/2021   CREATININE 1.39 (H) 07/18/2021   BILITOT 0.3 03/15/2021   ALKPHOS 64 03/15/2021   AST 13 03/15/2021   ALT 21 03/15/2021   PROT 6.7 03/15/2021   ALBUMIN 4.1 03/15/2021   CALCIUM 9.6 07/18/2021   ANIONGAP 4 (L) 12/28/2020   EGFR 45 (L) 07/18/2021   Lab Results  Component Value Date   CHOL 202 (H) 07/18/2021   Lab Results  Component Value Date   HDL 40 07/18/2021   Lab Results  Component Value Date   LDLCALC 112 (H) 07/18/2021   Lab Results  Component Value Date   TRIG 290 (H) 07/18/2021   Lab Results  Component Value Date   CHOLHDL 5.1 (H) 07/18/2021   Lab Results  Component Value Date   HGBA1C 5.9 (H) 03/15/2021      Assessment & Plan:   Problem List Items Addressed This Visit       Cardiovascular and Mediastinum   Primary hypertension - Primary    BP Readings from Last 1 Encounters:  11/17/21 124/75  Well-controlled with Telmisartan-HCTZ Counseled for compliance with the medications Advised DASH diet and moderate exercise/walking, at least 150 mins/week      Relevant Medications   telmisartan-hydrochlorothiazide (MICARDIS  HCT) 40-12.5 MG tablet     Endocrine   Subclinical hypothyroidism    Lab Results  Component Value Date   TSH 3.420 07/18/2021  Improved now Will recheck TSH and free T4 later        Genitourinary   Stage 3a chronic kidney disease (HCC)    Stable, last BMP reviewed Will recheck BMP, check PTH Advised to maintain adequate hydration Avoid nephrotoxic agents On ARB      Relevant Orders   Basic Metabolic Panel (BMET)   Parathyroid hormone, intact (no Ca)     Other   Morbid obesity (HCC)    Diet modification and moderate exercise advised - lost 15 lbs since last visit Will discuss about Bariatric surgery option later.      Primary insomnia    Could due to late shifts Started Vistaril PRN Sleep hygiene discussed, material provided      Relevant Medications   hydrOXYzine (VISTARIL) 25 MG capsule    Meds ordered this encounter  Medications   hydrOXYzine (VISTARIL) 25 MG capsule    Sig: Take 1 capsule (25 mg total) by mouth at bedtime as needed (Insomnia).    Dispense:  30 capsule    Refill:  2   telmisartan-hydrochlorothiazide (MICARDIS HCT) 40-12.5 MG tablet    Sig: Take 1 tablet by mouth daily.    Dispense:  90 tablet    Refill:  1    Follow-up: Return in about 4 months (around 03/17/2022) for Annual physical, second dose of Shingrix.    Lindell Spar, MD

## 2021-11-19 LAB — BASIC METABOLIC PANEL
BUN/Creatinine Ratio: 7 — ABNORMAL LOW (ref 9–23)
BUN: 8 mg/dL (ref 6–24)
CO2: 23 mmol/L (ref 20–29)
Calcium: 9.7 mg/dL (ref 8.7–10.2)
Chloride: 107 mmol/L — ABNORMAL HIGH (ref 96–106)
Creatinine, Ser: 1.08 mg/dL — ABNORMAL HIGH (ref 0.57–1.00)
Glucose: 103 mg/dL — ABNORMAL HIGH (ref 70–99)
Potassium: 4 mmol/L (ref 3.5–5.2)
Sodium: 148 mmol/L — ABNORMAL HIGH (ref 134–144)
eGFR: 60 mL/min/{1.73_m2} (ref >59)

## 2021-11-19 LAB — PARATHYROID HORMONE, INTACT (NO CA): PTH: 120 pg/mL — ABNORMAL HIGH (ref 15–65)

## 2022-01-11 ENCOUNTER — Other Ambulatory Visit: Payer: Self-pay | Admitting: Internal Medicine

## 2022-01-11 DIAGNOSIS — E782 Mixed hyperlipidemia: Secondary | ICD-10-CM

## 2022-02-13 ENCOUNTER — Encounter: Payer: Self-pay | Admitting: Internal Medicine

## 2022-02-13 ENCOUNTER — Ambulatory Visit (INDEPENDENT_AMBULATORY_CARE_PROVIDER_SITE_OTHER): Payer: 59 | Admitting: Internal Medicine

## 2022-02-13 VITALS — BP 124/82 | HR 62 | Resp 18 | Ht 62.0 in | Wt 249.2 lb

## 2022-02-13 DIAGNOSIS — M5136 Other intervertebral disc degeneration, lumbar region: Secondary | ICD-10-CM | POA: Insufficient documentation

## 2022-02-13 DIAGNOSIS — M545 Low back pain, unspecified: Secondary | ICD-10-CM

## 2022-02-13 DIAGNOSIS — M51369 Other intervertebral disc degeneration, lumbar region without mention of lumbar back pain or lower extremity pain: Secondary | ICD-10-CM | POA: Insufficient documentation

## 2022-02-13 MED ORDER — METHYLPREDNISOLONE ACETATE 80 MG/ML IJ SUSP
80.0000 mg | Freq: Once | INTRAMUSCULAR | Status: AC
  Administered 2022-02-13: 80 mg via INTRAMUSCULAR

## 2022-02-13 MED ORDER — KETOROLAC TROMETHAMINE 60 MG/2ML IM SOLN
60.0000 mg | Freq: Once | INTRAMUSCULAR | Status: AC
  Administered 2022-02-13: 60 mg via INTRAMUSCULAR

## 2022-02-13 MED ORDER — METHYLPREDNISOLONE 4 MG PO TBPK: ORAL_TABLET | ORAL | 0 refills | Status: DC

## 2022-02-13 MED ORDER — CYCLOBENZAPRINE HCL 10 MG PO TABS: 10.0000 mg | ORAL_TABLET | Freq: Three times a day (TID) | ORAL | 0 refills | Status: DC | PRN

## 2022-02-13 NOTE — Assessment & Plan Note (Signed)
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

## 2022-02-13 NOTE — Progress Notes (Signed)
? ?Acute Office Visit ? ?Subjective:  ? ? Patient ID: Erica Greer, female    DOB: July 07, 1966, 56 y.o.   MRN: [id number] ? ?Chief Complaint  ?Patient presents with  ? Back Pain  ?  Back pain has been going on for at least two weeks lower back sharp pain comes and goes   ? ? ?HPI ?Patient is in today for complaint of acute on chronic low back pain, which is worse for the last 2 weeks.  Her pain is sharp, 8-10/10, intermittent and worse with movement.  She currently denies any radiation of the low back pain.  Denies any numbness or weakness of the LE.  She has not had any recent fall or injury.  Previous x-ray of the lumbar spine showed degenerative disc disease. ? ?Past Medical History:  ?Diagnosis Date  ? Arthritis   ? Phreesia 01/16/2021  ? Chronic knee pain   ? HLD (hyperlipidemia)   ? HTN (hypertension)   ? Ovarian cyst   ? right   ? Subclinical hypothyroidism   ? ? ?Past Surgical History:  ?Procedure Laterality Date  ? COLONOSCOPY WITH PROPOFOL N/A 09/08/2021  ? Procedure: COLONOSCOPY WITH PROPOFOL;  Surgeon: Daneil Dolin, MD;  [facility];  Service: Endoscopy;  Laterality: N/A;  12:45pm  ? LAPAROSCOPIC SALPINGO OOPHERECTOMY Right 10/04/2015  ? Procedure: LAPAROSCOPIC RIGHT SALPINGO OOPHORECTOMY;  Surgeon: Jonnie Kind, MD;  Location: [facility];  Service: Gynecology;  Laterality: Right;  ? LAPAROSCOPIC UNILATERAL SALPINGECTOMY Left 10/04/2015  ? Procedure: LAPAROSCOPIC LEFT SALPINGECTOMY;  Surgeon: Jonnie Kind, MD;  Location: [facility];  Service: Gynecology;  Laterality: Left;  ? OVARIAN CYST REMOVAL Left   ? POLYPECTOMY  09/08/2021  ? Procedure: POLYPECTOMY;  Surgeon: Daneil Dolin, MD;  [facility];  Service: Endoscopy;;  cecal; hepatic flexure  ? TUBAL LIGATION Left   ? Unilateral  ? ? ?Family History  ?Problem Relation Age of Onset  ? Diabetes Mother   ? Cancer Father   ? Seizures Sister   ? Colon cancer Neg Hx   ? Colon polyps Neg Hx   ? ? ?Social History  ? ?Socioeconomic  History  ? Marital status: Legally Separated  ?  Spouse name: Not on file  ? Number of children: Not on file  ? Years of education: Not on file  ? Highest education level: Not on file  ?Occupational History  ? Not on file  ?Tobacco Use  ? Smoking status: Never  ? Smokeless tobacco: Never  ?Substance and Sexual Activity  ? Alcohol use: No  ? Drug use: No  ? Sexual activity: Yes  ?  Birth control/protection: Surgical  ?Other Topics Concern  ? Not on file  ?Social History Narrative  ? Not on file  ? ?Social Determinants of Health  ? ?Financial Resource Strain: Not on file  ?Food Insecurity: Not on file  ?Transportation Needs: Not on file  ?Physical Activity: Not on file  ?Stress: Not on file  ?Social Connections: Not on file  ?Intimate Partner Violence: Not on file  ? ? ?Outpatient Medications Prior to Visit  ?Medication Sig Dispense Refill  ? atorvastatin (LIPITOR) 20 MG tablet TAKE 1 TABLET(20 MG) BY MOUTH DAILY 90 tablet 1  ? docusate sodium (COLACE) 100 MG capsule Take 1 capsule (100 mg total) by mouth daily as needed for mild constipation. 30 capsule 0  ? hydrocortisone (ANUSOL-HC) 2.5 % rectal cream Place 1 application rectally 2 (two) times daily. As  needed 28 g 0  ? hydrOXYzine (VISTARIL) 25 MG capsule Take 1 capsule (25 mg total) by mouth at bedtime as needed (Insomnia). 30 capsule 2  ? ondansetron (ZOFRAN) 4 MG tablet Take 1 tablet (4 mg total) by mouth every 8 (eight) hours as needed for nausea or vomiting. 20 tablet 0  ? polyethylene glycol-electrolytes (TRILYTE) 420 g solution Take 4,000 mLs by mouth as directed. 4000 mL 0  ? telmisartan-hydrochlorothiazide (MICARDIS HCT) 40-12.5 MG tablet Take 1 tablet by mouth daily. 90 tablet 1  ? Vitamin D, Ergocalciferol, (DRISDOL) 1.25 MG (50000 UNIT) CAPS capsule Take 1 capsule (50,000 Units total) by mouth every 7 (seven) days. 5 capsule 5  ? cyclobenzaprine (FLEXERIL) 10 MG tablet Take 1 tablet (10 mg total) by mouth 3 (three) times daily as needed for muscle  spasms. 30 tablet 0  ? ?No facility-administered medications prior to visit.  ? ? ?Allergies  ?Allergen Reactions  ? Hydrocodone Nausea And Vomiting  ? ? ?Review of Systems  ?Constitutional:  Negative for chills and fever.  ?Respiratory:  Negative for cough and shortness of breath.   ?Cardiovascular:  Negative for chest pain and palpitations.  ?Gastrointestinal:  Positive for constipation. Negative for nausea and vomiting.  ?Genitourinary:  Negative for dysuria and hematuria.  ?Musculoskeletal:  Positive for back pain. Negative for neck pain and neck stiffness.  ?Skin:  Negative for rash.  ?Neurological:  Negative for dizziness and weakness.  ?Psychiatric/Behavioral:  Positive for sleep disturbance. Negative for agitation and behavioral problems.   ? ?   ?Objective:  ?  ?Physical Exam ?Vitals reviewed.  ?Constitutional:   ?   General: She is not in acute distress. ?   Appearance: She is obese. She is not diaphoretic.  ?HENT:  ?   Head: Normocephalic and atraumatic.  ?   Nose: Nose normal.  ?   Mouth/Throat:  ?   Mouth: Mucous membranes are moist.  ?Eyes:  ?   General: No scleral icterus. ?   Extraocular Movements: Extraocular movements intact.  ?Cardiovascular:  ?   Rate and Rhythm: Normal rate and regular rhythm.  ?   Pulses: Normal pulses.  ?   Heart sounds: Normal heart sounds. No murmur heard. ?Pulmonary:  ?   Breath sounds: Normal breath sounds. No wheezing or rales.  ?Musculoskeletal:     ?   General: Tenderness (Lumbar spine area) present.  ?   Cervical back: Neck supple. No tenderness.  ?   Right lower leg: No edema.  ?   Left lower leg: No edema.  ?Skin: ?   General: Skin is warm.  ?   Findings: No rash.  ?Neurological:  ?   General: No focal deficit present.  ?   Mental Status: She is alert and oriented to person, place, and time.  ?Psychiatric:     ?   Mood and Affect: Mood normal.     ?   Behavior: Behavior normal.  ? ? ?BP 124/82 (BP Location: Right Arm, Patient Position: Sitting, Cuff Size: Normal)    Pulse 62   Resp 18   Ht '5\' 2"'$  (1.575 m)   Wt 249 lb 3.2 oz (113 kg)   LMP 02/11/2015   SpO2 98%   BMI 45.58 kg/m?  ?Wt Readings from Last 3 Encounters:  ?02/13/22 249 lb 3.2 oz (113 kg)  ?11/17/21 251 lb 0.6 oz (113.9 kg)  ?09/06/21 (P) 266 lb (120.7 kg)  ? ? ? ?   ?Assessment & Plan:  ? ?  Problem List Items Addressed This Visit   ? ?  ? Musculoskeletal and Integument  ? DDD (degenerative disc disease), lumbar  ? Relevant Medications  ? methylPREDNISolone (MEDROL DOSEPAK) 4 MG TBPK tablet  ? cyclobenzaprine (FLEXERIL) 10 MG tablet  ? Other Relevant Orders  ? MR Lumbar Spine Wo Contrast  ?  ? Other  ? Acute midline low back pain without sciatica - Primary  ?  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  ?  ? Relevant Medications  ? methylPREDNISolone (MEDROL DOSEPAK) 4 MG TBPK tablet  ? cyclobenzaprine (FLEXERIL) 10 MG tablet  ? Other Relevant Orders  ? MR Lumbar Spine Wo Contrast  ? ? ? ?Meds ordered this encounter  ?Medications  ? methylPREDNISolone (MEDROL DOSEPAK) 4 MG TBPK tablet  ?  Sig: Take as package instructions.  ?  Dispense:  1 each  ?  Refill:  0  ? cyclobenzaprine (FLEXERIL) 10 MG tablet  ?  Sig: Take 1 tablet (10 mg total) by mouth 3 (three) times daily as needed for muscle spasms.  ?  Dispense:  30 tablet  ?  Refill:  0  ? methylPREDNISolone acetate (DEPO-MEDROL) injection 80 mg  ? ketorolac (TORADOL) injection 60 mg  ? ? ? ?Lindell Spar, MD ?

## 2022-02-13 NOTE — Patient Instructions (Signed)
Please start taking Prednisone as prescribed. ? ?Please take Flexeril as needed for muscle spasms. ? ?You are being scheduled to get MRI of lumbar spine. ?

## 2022-02-13 NOTE — Telephone Encounter (Signed)
Pt made an appt 02-13-22  ?

## 2022-02-20 ENCOUNTER — Other Ambulatory Visit: Payer: Self-pay | Admitting: Internal Medicine

## 2022-03-06 ENCOUNTER — Encounter: Payer: Self-pay | Admitting: *Deleted

## 2022-03-15 ENCOUNTER — Ambulatory Visit (HOSPITAL_COMMUNITY)
Admission: RE | Admit: 2022-03-15 | Discharge: 2022-03-15 | Disposition: A | Discharge status: Home / Self Care | Payer: 59 | Source: Ambulatory Visit | Attending: Internal Medicine | Admitting: Internal Medicine

## 2022-03-15 ENCOUNTER — Other Ambulatory Visit: Payer: Self-pay | Admitting: *Deleted

## 2022-03-15 DIAGNOSIS — M5136 Other intervertebral disc degeneration, lumbar region: Secondary | ICD-10-CM | POA: Insufficient documentation

## 2022-03-15 DIAGNOSIS — M545 Low back pain, unspecified: Secondary | ICD-10-CM | POA: Insufficient documentation

## 2022-03-19 ENCOUNTER — Other Ambulatory Visit: Payer: Self-pay | Admitting: Internal Medicine

## 2022-03-19 DIAGNOSIS — M47812 Spondylosis without myelopathy or radiculopathy, cervical region: Secondary | ICD-10-CM

## 2022-03-19 DIAGNOSIS — M5136 Other intervertebral disc degeneration, lumbar region: Secondary | ICD-10-CM

## 2022-03-21 ENCOUNTER — Encounter: Payer: Self-pay | Admitting: Internal Medicine

## 2022-03-21 ENCOUNTER — Other Ambulatory Visit: Payer: Self-pay | Admitting: *Deleted

## 2022-03-21 ENCOUNTER — Ambulatory Visit (INDEPENDENT_AMBULATORY_CARE_PROVIDER_SITE_OTHER): Payer: 59 | Admitting: Internal Medicine

## 2022-03-21 VITALS — BP 138/78 | HR 58 | Resp 18 | Ht 64.0 in | Wt 252.4 lb

## 2022-03-21 DIAGNOSIS — E559 Vitamin D deficiency, unspecified: Secondary | ICD-10-CM

## 2022-03-21 DIAGNOSIS — Z0001 Encounter for general adult medical examination with abnormal findings: Secondary | ICD-10-CM | POA: Diagnosis not present

## 2022-03-21 DIAGNOSIS — E782 Mixed hyperlipidemia: Secondary | ICD-10-CM

## 2022-03-21 DIAGNOSIS — N1831 Chronic kidney disease, stage 3a: Secondary | ICD-10-CM

## 2022-03-21 DIAGNOSIS — I10 Essential (primary) hypertension: Secondary | ICD-10-CM | POA: Diagnosis not present

## 2022-03-21 DIAGNOSIS — Z23 Encounter for immunization: Secondary | ICD-10-CM

## 2022-03-21 DIAGNOSIS — E038 Other specified hypothyroidism: Secondary | ICD-10-CM

## 2022-03-21 DIAGNOSIS — R7303 Prediabetes: Secondary | ICD-10-CM

## 2022-03-21 DIAGNOSIS — Z1231 Encounter for screening mammogram for malignant neoplasm of breast: Secondary | ICD-10-CM

## 2022-03-21 DIAGNOSIS — M5136 Other intervertebral disc degeneration, lumbar region: Secondary | ICD-10-CM

## 2022-03-21 MED ORDER — NAPROXEN 500 MG PO TABS: 500.0000 mg | ORAL_TABLET | Freq: Two times a day (BID) | ORAL | 1 refills | Status: DC

## 2022-03-21 NOTE — Assessment & Plan Note (Addendum)
Noted on MRI of the lumbar spine, which disc osteophyte complex causing nerve root displacement at L5 ?Referred to Ortho care in [place] ?Naproxen as needed for pain for now, would avoid for long-term due to CKD ?Flexeril as needed for muscle spasms ?

## 2022-03-21 NOTE — Assessment & Plan Note (Signed)
BP Readings from Last 1 Encounters:  ?03/21/22 138/78  ? ?Well-controlled with Telmisartan-HCTZ ?Counseled for compliance with the medications ?Advised DASH diet and moderate exercise/walking, at least 150 mins/week ?

## 2022-03-21 NOTE — Assessment & Plan Note (Signed)
Stable, last BMP reviewed ?Advised to maintain adequate hydration ?Avoid nephrotoxic agents ?On ARB ?

## 2022-03-21 NOTE — Assessment & Plan Note (Signed)
Annual exam as documented. Counseling done  re healthy lifestyle involving commitment to 150 minutes exercise per week, heart healthy diet, and attaining healthy weight.The importance of adequate sleep also discussed. Changes in health habits are decided on by the patient with goals and time frames  set for achieving them. Immunization and cancer screening needs are specifically addressed at this visit. 

## 2022-03-21 NOTE — Patient Instructions (Addendum)
Please continue to take medications as prescribed. ? ?Please continue to follow low carb diet and perform moderate exercise/walking at least 150 mins/week. ?

## 2022-03-21 NOTE — Progress Notes (Signed)
? ?Established Patient Office Visit ? ?Subjective:  ?Patient ID: Erica Greer, female    DOB: 03-Nov-1966  Age: 56 y.o. MRN: [id number] ? ?CC:  ?Chief Complaint  ?Patient presents with  ? Annual Exam  ?  Annual exam pt still having back pain but seeing ortho appt this week   ? ? ?HPI ?Erica Greer is a 56 y.o. female with past medical history of HTN, chronic low back pain, OA of left knee and morbid obesity who presents for annual physical. ? ?BP is well-controlled. Takes medications regularly. Patient denies headache, dizziness, chest pain, dyspnea or palpitations. ?  ?DDD of lumbar spine: She has acute on chronic low back pain, for which she had MRI of lumbar spine, which showed - Right foraminal/far lateral disc osteophyte complex at L5-S1 ?causing mild displacement of the exiting right L5 nerve root. Advanced facet degenerative changes at L3-4 and L4-5 resulting in ?trace anterolisthesis at these levels. She is going to see Orthopedic surgery for it. ? ?She received second dose of Shingrix vaccine in the office today. ? ? ? ? ? ?Past Medical History:  ?Diagnosis Date  ? Arthritis   ? Phreesia 01/16/2021  ? Chronic knee pain   ? HLD (hyperlipidemia)   ? HTN (hypertension)   ? Ovarian cyst   ? right   ? Subclinical hypothyroidism   ? ? ?Past Surgical History:  ?Procedure Laterality Date  ? COLONOSCOPY WITH PROPOFOL N/A 09/08/2021  ? Procedure: COLONOSCOPY WITH PROPOFOL;  Surgeon: Daneil Dolin, MD;  [facility];  Service: Endoscopy;  Laterality: N/A;  12:45pm  ? LAPAROSCOPIC SALPINGO OOPHERECTOMY Right 10/04/2015  ? Procedure: LAPAROSCOPIC RIGHT SALPINGO OOPHORECTOMY;  Surgeon: Jonnie Kind, MD;  Location: [facility];  Service: Gynecology;  Laterality: Right;  ? LAPAROSCOPIC UNILATERAL SALPINGECTOMY Left 10/04/2015  ? Procedure: LAPAROSCOPIC LEFT SALPINGECTOMY;  Surgeon: Jonnie Kind, MD;  Location: [facility];  Service: Gynecology;  Laterality: Left;  ? OVARIAN CYST REMOVAL Left   ? POLYPECTOMY   09/08/2021  ? Procedure: POLYPECTOMY;  Surgeon: Daneil Dolin, MD;  [facility];  Service: Endoscopy;;  cecal; hepatic flexure  ? TUBAL LIGATION Left   ? Unilateral  ? ? ?Family History  ?Problem Relation Age of Onset  ? Diabetes Mother   ? Cancer Father   ? Seizures Sister   ? Colon cancer Neg Hx   ? Colon polyps Neg Hx   ? ? ?Social History  ? ?Socioeconomic History  ? Marital status: Legally Separated  ?  Spouse name: Not on file  ? Number of children: Not on file  ? Years of education: Not on file  ? Highest education level: Not on file  ?Occupational History  ? Not on file  ?Tobacco Use  ? Smoking status: Never  ? Smokeless tobacco: Never  ?Substance and Sexual Activity  ? Alcohol use: No  ? Drug use: No  ? Sexual activity: Yes  ?  Birth control/protection: Surgical  ?Other Topics Concern  ? Not on file  ?Social History Narrative  ? Not on file  ? ?Social Determinants of Health  ? ?Financial Resource Strain: Not on file  ?Food Insecurity: Not on file  ?Transportation Needs: Not on file  ?Physical Activity: Not on file  ?Stress: Not on file  ?Social Connections: Not on file  ?Intimate Partner Violence: Not on file  ? ? ?Outpatient Medications Prior to Visit  ?Medication Sig Dispense Refill  ? atorvastatin (LIPITOR) 20 MG tablet  TAKE 1 TABLET(20 MG) BY MOUTH DAILY 90 tablet 1  ? cyclobenzaprine (FLEXERIL) 10 MG tablet Take 1 tablet (10 mg total) by mouth 3 (three) times daily as needed for muscle spasms. 30 tablet 0  ? docusate sodium (COLACE) 100 MG capsule Take 1 capsule (100 mg total) by mouth daily as needed for mild constipation. 30 capsule 0  ? hydrocortisone (ANUSOL-HC) 2.5 % rectal cream Place 1 application rectally 2 (two) times daily. As needed 28 g 0  ? hydrOXYzine (VISTARIL) 25 MG capsule Take 1 capsule (25 mg total) by mouth at bedtime as needed (Insomnia). 30 capsule 2  ? ondansetron (ZOFRAN) 4 MG tablet Take 1 tablet (4 mg total) by mouth every 8 (eight) hours as needed for nausea or  vomiting. 20 tablet 0  ? telmisartan-hydrochlorothiazide (MICARDIS HCT) 40-12.5 MG tablet Take 1 tablet by mouth daily. 90 tablet 1  ? methylPREDNISolone (MEDROL DOSEPAK) 4 MG TBPK tablet Take as package instructions. 1 each 0  ? polyethylene glycol-electrolytes (TRILYTE) 420 g solution Take 4,000 mLs by mouth as directed. 4000 mL 0  ? Vitamin D, Ergocalciferol, (DRISDOL) 1.25 MG (50000 UNIT) CAPS capsule Take 1 capsule (50,000 Units total) by mouth every 7 (seven) days. 5 capsule 5  ? ?No facility-administered medications prior to visit.  ? ? ?Allergies  ?Allergen Reactions  ? Hydrocodone Nausea And Vomiting  ? ? ?ROS ?Review of Systems  ?Constitutional:  Negative for chills and fever.  ?HENT:  Negative for congestion, sinus pressure, sinus pain and sore throat.   ?Respiratory:  Negative for cough and shortness of breath.   ?Cardiovascular:  Negative for chest pain and palpitations.  ?Gastrointestinal:  Positive for constipation. Negative for nausea and vomiting.  ?Genitourinary:  Negative for dysuria and hematuria.  ?Musculoskeletal:  Positive for back pain. Negative for neck pain and neck stiffness.  ?Skin:  Negative for rash.  ?Neurological:  Negative for dizziness and weakness.  ?Psychiatric/Behavioral:  Positive for sleep disturbance. Negative for agitation and behavioral problems.   ? ?  ?Objective:  ?  ?Physical Exam ?Vitals reviewed.  ?Constitutional:   ?   General: She is not in acute distress. ?   Appearance: She is obese. She is not diaphoretic.  ?HENT:  ?   Head: Normocephalic and atraumatic.  ?   Nose: Nose normal.  ?   Mouth/Throat:  ?   Mouth: Mucous membranes are moist.  ?Eyes:  ?   General: No scleral icterus. ?   Extraocular Movements: Extraocular movements intact.  ?Cardiovascular:  ?   Rate and Rhythm: Normal rate and regular rhythm.  ?   Pulses: Normal pulses.  ?   Heart sounds: Normal heart sounds. No murmur heard. ?Pulmonary:  ?   Breath sounds: Normal breath sounds. No wheezing or rales.   ?Abdominal:  ?   Palpations: Abdomen is soft.  ?   Tenderness: There is no abdominal tenderness.  ?Musculoskeletal:     ?   General: Tenderness (Lumbar spine area) present.  ?   Cervical back: Neck supple. No tenderness.  ?   Right lower leg: No edema.  ?   Left lower leg: No edema.  ?Skin: ?   General: Skin is warm.  ?   Findings: No rash.  ?Neurological:  ?   General: No focal deficit present.  ?   Mental Status: She is alert and oriented to person, place, and time.  ?   Cranial Nerves: No cranial nerve deficit.  ?   Sensory: No  sensory deficit.  ?   Motor: No weakness.  ?Psychiatric:     ?   Mood and Affect: Mood normal.     ?   Behavior: Behavior normal.  ? ? ?BP 138/78 (BP Location: Left Arm, Patient Position: Sitting, Cuff Size: Normal)   Pulse (!) 58   Resp 18   Ht _0  (1.626 m)   Wt 252 lb 6.4 oz (114.5 kg)   LMP 02/11/2015   SpO2 100%   BMI 43.32 kg/m?  ?Wt Readings from Last 3 Encounters:  ?03/21/22 252 lb 6.4 oz (114.5 kg)  ?02/13/22 249 lb 3.2 oz (113 kg)  ?11/17/21 251 lb 0.6 oz (113.9 kg)  ? ? ?Lab Results  ?Component Value Date  ? TSH 3.420 07/18/2021  ? ?Lab Results  ?Component Value Date  ? WBC 9.0 12/28/2020  ? HGB 12.4 12/28/2020  ? HCT 40.2 12/28/2020  ? MCV 80.1 12/28/2020  ? PLT 218 12/28/2020  ? ?Lab Results  ?Component Value Date  ? NA 148 (H) 11/17/2021  ? K 4.0 11/17/2021  ? CO2 23 11/17/2021  ? GLUCOSE 103 (H) 11/17/2021  ? BUN 8 11/17/2021  ? CREATININE 1.08 (H) 11/17/2021  ? BILITOT 0.3 03/15/2021  ? ALKPHOS 64 03/15/2021  ? AST 13 03/15/2021  ? ALT 21 03/15/2021  ? PROT 6.7 03/15/2021  ? ALBUMIN 4.1 03/15/2021  ? CALCIUM 9.7 11/17/2021  ? ANIONGAP 4 (L) 12/28/2020  ? EGFR 60 11/17/2021  ? ?Lab Results  ?Component Value Date  ? CHOL 202 (H) 07/18/2021  ? ?Lab Results  ?Component Value Date  ? HDL 40 07/18/2021  ? ?Lab Results  ?Component Value Date  ? LDLCALC 112 (H) 07/18/2021  ? ?Lab Results  ?Component Value Date  ? TRIG 290 (H) 07/18/2021  ? ?Lab Results  ?Component Value  Date  ? CHOLHDL 5.1 (H) 07/18/2021  ? ?Lab Results  ?Component Value Date  ? HGBA1C 5.9 (H) 03/15/2021  ? ? ?  ?Assessment & Plan:  ? ?Problem List Items Addressed This Visit   ? ?  ? Cardiovascular and [place]

## 2022-03-22 LAB — VITAMIN D 25 HYDROXY (VIT D DEFICIENCY, FRACTURES): Vit D, 25-Hydroxy: 14.5 ng/mL — ABNORMAL LOW (ref 30.0–100.0)

## 2022-03-22 LAB — CMP14+EGFR
ALT: 11 IU/L (ref 0–32)
AST: 14 IU/L (ref 0–40)
Albumin/Globulin Ratio: 1.6 (ref 1.2–2.2)
Albumin: 4.1 g/dL (ref 3.8–4.9)
Alkaline Phosphatase: 59 IU/L (ref 44–121)
BUN/Creatinine Ratio: 13 (ref 9–23)
BUN: 14 mg/dL (ref 6–24)
Bilirubin Total: <0.2 mg/dL (ref 0.0–1.2)
CO2: 24 mmol/L (ref 20–29)
Calcium: 9.3 mg/dL (ref 8.7–10.2)
Chloride: 108 mmol/L — ABNORMAL HIGH (ref 96–106)
Creatinine, Ser: 1.05 mg/dL — ABNORMAL HIGH (ref 0.57–1.00)
Globulin, Total: 2.5 g/dL (ref 1.5–4.5)
Glucose: 91 mg/dL (ref 70–99)
Potassium: 4.7 mmol/L (ref 3.5–5.2)
Sodium: 144 mmol/L (ref 134–144)
Total Protein: 6.6 g/dL (ref 6.0–8.5)
eGFR: 62 mL/min/{1.73_m2} (ref >59)

## 2022-03-22 LAB — LIPID PANEL
Chol/HDL Ratio: 3.2 ratio (ref 0.0–4.4)
Cholesterol, Total: 169 mg/dL (ref 100–199)
HDL: 53 mg/dL (ref >39)
LDL Chol Calc (NIH): 90 mg/dL (ref 0–99)
Triglycerides: 150 mg/dL — ABNORMAL HIGH (ref 0–149)
VLDL Cholesterol Cal: 26 mg/dL (ref 5–40)

## 2022-03-22 LAB — CBC WITH DIFFERENTIAL/PLATELET
Basophils Absolute: 0.1 10*3/uL (ref 0.0–0.2)
Basos: 1 %
EOS (ABSOLUTE): 0.3 10*3/uL (ref 0.0–0.4)
Eos: 3 %
Hematocrit: 32.5 % — ABNORMAL LOW (ref 34.0–46.6)
Hemoglobin: 10.6 g/dL — ABNORMAL LOW (ref 11.1–15.9)
Immature Grans (Abs): 0 10*3/uL (ref 0.0–0.1)
Immature Granulocytes: 0 %
Lymphocytes Absolute: 2.3 10*3/uL (ref 0.7–3.1)
Lymphs: 22 %
MCH: 25.1 pg — ABNORMAL LOW (ref 26.6–33.0)
MCHC: 32.6 g/dL (ref 31.5–35.7)
MCV: 77 fL — ABNORMAL LOW (ref 79–97)
Monocytes Absolute: 0.7 10*3/uL (ref 0.1–0.9)
Monocytes: 7 %
Neutrophils Absolute: 7.3 10*3/uL — ABNORMAL HIGH (ref 1.4–7.0)
Neutrophils: 67 %
Platelets: 205 10*3/uL (ref 150–450)
RBC: 4.22 x10E6/uL (ref 3.77–5.28)
RDW: 13.3 % (ref 11.7–15.4)
WBC: 10.8 10*3/uL (ref 3.4–10.8)

## 2022-03-22 LAB — TSH+FREE T4
Free T4: 1.38 ng/dL (ref 0.82–1.77)
TSH: 2.38 u[IU]/mL (ref 0.450–4.500)

## 2022-03-22 LAB — HEMOGLOBIN A1C
Est. average glucose Bld gHb Est-mCnc: 111 mg/dL
Hgb A1c MFr Bld: 5.5 % (ref 4.8–5.6)

## 2022-03-23 ENCOUNTER — Encounter: Payer: Self-pay | Admitting: Internal Medicine

## 2022-03-28 ENCOUNTER — Ambulatory Visit (HOSPITAL_COMMUNITY)
Admission: RE | Admit: 2022-03-28 | Discharge: 2022-03-28 | Disposition: A | Discharge status: Home / Self Care | Payer: 59 | Source: Ambulatory Visit | Attending: Internal Medicine | Admitting: Internal Medicine

## 2022-03-28 DIAGNOSIS — Z1231 Encounter for screening mammogram for malignant neoplasm of breast: Secondary | ICD-10-CM | POA: Insufficient documentation

## 2022-03-29 ENCOUNTER — Ambulatory Visit (INDEPENDENT_AMBULATORY_CARE_PROVIDER_SITE_OTHER): Payer: 59 | Admitting: Orthopaedic Surgery

## 2022-03-29 ENCOUNTER — Encounter: Payer: Self-pay | Admitting: Orthopaedic Surgery

## 2022-03-29 VITALS — Ht 64.0 in | Wt 254.0 lb

## 2022-03-29 DIAGNOSIS — G8929 Other chronic pain: Secondary | ICD-10-CM | POA: Diagnosis not present

## 2022-03-29 DIAGNOSIS — M545 Low back pain, unspecified: Secondary | ICD-10-CM | POA: Diagnosis not present

## 2022-03-29 DIAGNOSIS — M5136 Other intervertebral disc degeneration, lumbar region: Secondary | ICD-10-CM

## 2022-03-29 NOTE — Progress Notes (Signed)
Office Visit Note   Patient: Erica Greer           Date of Birth: [birth year]           MRN: [id number] Visit Date: 03/29/2022              Requested by: Lindell Spar, MD [address] [place],  [place] [address] PCP: Lindell Spar, MD   Assessment & Plan: Visit Diagnoses:  1. Chronic bilateral low back pain, unspecified whether sciatica present   2. Morbid obesity ([place])   3. DDD (degenerative disc disease), lumbar     Plan: Pathophysiology discussed MRI scan reviewed she has some degenerative facets in the lumbar spine.  We will set her up for physical therapy at [facility] outpatient physical therapy.  Core strengthening.  [facility] referral.The patient meets the AMA guidelines for Morbid (severe) obesity with a BMI > 40.0 and I have recommended weight loss.   Follow-Up Instructions: Return in about 2 months (around 05/29/2022).   Orders:  Orders Placed This Encounter  Procedures   Amb Ref to Medical Weight Management   Ambulatory referral to Physical Therapy   No orders of the defined types were placed in this encounter.     Procedures: No procedures performed   Clinical Data: No additional findings.   Subjective: Chief Complaint  Patient presents with   Lower Back - Pain    HPI 56 year old female with 6 months history of back pain no radicular symptoms pain primarily midline more painful with turning twisting bending.  BMI is greater than 40.  She has had additional history of stage III kidney disease, cervical spondylosis and some left knee osteoarthritis.  No bowel bladder symptoms no fever or chills.  No previous lumbar procedures.  She has not been through physical therapy for her back.  Patient has been on Naprosyn and also Flexeril.  She takes medication for blood pressure and cholesterol.  Review of Systems all other systems noncontributory to HPI.   Objective: Vital Signs: Ht '5\' 4"'$  (1.626 m)   Wt 254 lb (115.2 kg)    LMP 02/11/2015   BMI 43.60 kg/m   Physical Exam Constitutional:      Appearance: She is well-developed.  HENT:     Head: Normocephalic.     Right Ear: External ear normal.     Left Ear: External ear normal. There is no impacted cerumen.  Eyes:     Pupils: Pupils are equal, round, and reactive to light.  Neck:     Thyroid: No thyromegaly.     Trachea: No tracheal deviation.  Cardiovascular:     Rate and Rhythm: Normal rate.  Pulmonary:     Effort: Pulmonary effort is normal.  Abdominal:     Palpations: Abdomen is soft.  Musculoskeletal:     Cervical back: No rigidity.  Skin:    General: Skin is warm and dry.  Neurological:     Mental Status: She is alert and oriented to person, place, and time.  Psychiatric:        Behavior: Behavior normal.    Ortho Exam tenderness with palpation lumbar spine some discomfort with flexion extension and rotation lumbar spine.  Knee and ankle jerk are intact negative straight leg raising 90 degrees.  Anterior tib gastrocsoleus is intact.  Palpable pulses.  Specialty Comments:  No specialty comments available.  Imaging:CLINICAL DATA:  Acute midline low back pain without sciatica M54.50 (ICD-10-CM). Lumbar radiculopathy, symptoms  persist with > 6 wks treatment. DDD (degenerative disc disease), lumbar M51.36 (ICD-10-CM).   EXAM: MRI LUMBAR SPINE WITHOUT CONTRAST   TECHNIQUE: Multiplanar, multisequence MR imaging of the lumbar spine was performed. No intravenous contrast was administered.   COMPARISON:  Radiograph February 13, 2021.   FINDINGS: Segmentation:  Standard.   Alignment:  Small anterolisthesis at L3-4 and L4-5.   Vertebrae:  No fracture, evidence of discitis, or bone lesion.   Conus medullaris and cauda equina: Conus extends to the L1-2 level. Conus and cauda equina appear normal.   Paraspinal and other soft tissues: Negative.   Disc levels:   T12-L1: Tiny central disc protrusion. No spinal canal or neural foraminal  stenosis.   L1-2 mild facet degenerative changes. No spinal canal or neural foraminal stenosis.   L2-3: Mild facet degenerative changes. No spinal canal or neural foraminal stenosis.   L3-4: Shallow disc bulge/disc uncovering, hypertrophic facet degenerative changes and ligamentum flavum redundancy resulting in mild spinal canal stenosis. No significant neural foraminal narrowing.   L4-5: Shallow disc bulge and hypertrophic facet degenerative changes. No significant spinal canal or neural foraminal stenosis.   L5-S1: Right foraminal disc protrusion with far lateral disc osteophyte component abutting the exiting right L5 nerve root in the neural foramen and causing mild lateral displacement of the extraforaminal segment of this nerve root (series 8, image 27). Mild facet degenerative changes. Mild right neural foraminal narrowing. No spinal canal stenosis.   IMPRESSION: 1. Right foraminal/far lateral disc osteophyte complex at L5-S1 causing mild displacement of the exiting right L5 nerve root, as detailed above. 2. Advanced facet degenerative changes at L3-4 and L4-5 resulting in trace anterolisthesis at these levels. 3. No high-grade spinal canal or neural foraminal stenosis at any level.     Electronically Signed   By: Pedro Earls M.D.   On: 03/15/2022 11:55      PMFS History: Patient Active Problem List   Diagnosis Date Noted   DDD (degenerative disc disease), lumbar 02/13/2022   Primary insomnia 11/17/2021   Stage 3a chronic kidney disease ([place]) 11/17/2021   Subclinical hypothyroidism 07/18/2021   Vitamin D deficiency 07/18/2021   Rectal pain 05/19/2021   Encounter for general adult medical examination with abnormal findings 03/15/2021   Primary hypertension 01/18/2021   Osteoarthritis of left knee 01/18/2021   Cervical spondylosis 01/18/2021   Acute midline low back pain without sciatica 01/18/2021   Morbid obesity ([place]) 01/18/2021   Mass  of right ovary 09/16/2015   Past Medical History:  Diagnosis Date   Arthritis    Phreesia 01/16/2021   Chronic knee pain    HLD (hyperlipidemia)    HTN (hypertension)    Ovarian cyst    right    Subclinical hypothyroidism     Family History  Problem Relation Age of Onset   Diabetes Mother    Cancer Father    Seizures Sister    Colon cancer Neg Hx    Colon polyps Neg Hx     Past Surgical History:  Procedure Laterality Date   COLONOSCOPY WITH PROPOFOL N/A 09/08/2021   Procedure: COLONOSCOPY WITH PROPOFOL;  Surgeon: Daneil Dolin, MD;  [facility];  Service: Endoscopy;  Laterality: N/A;  12:45pm   LAPAROSCOPIC SALPINGO OOPHERECTOMY Right 10/04/2015   Procedure: LAPAROSCOPIC RIGHT SALPINGO OOPHORECTOMY;  Surgeon: Jonnie Kind, MD;  Location: [facility];  Service: Gynecology;  Laterality: Right;   LAPAROSCOPIC UNILATERAL SALPINGECTOMY Left 10/04/2015   Procedure: LAPAROSCOPIC LEFT SALPINGECTOMY;  Surgeon: Jonnie Kind, MD;  Location: [facility];  Service: Gynecology;  Laterality: Left;   OVARIAN CYST REMOVAL Left    POLYPECTOMY  09/08/2021   Procedure: POLYPECTOMY;  Surgeon: Daneil Dolin, MD;  [facility];  Service: Endoscopy;;  cecal; hepatic flexure   TUBAL LIGATION Left    Unilateral   Social History   Occupational History   Not on file  Tobacco Use   Smoking status: Never   Smokeless tobacco: Never  Substance and Sexual Activity   Alcohol use: No   Drug use: No   Sexual activity: Yes    Birth control/protection: Surgical

## 2022-04-22 NOTE — Therapy (Signed)
OUTPATIENT PHYSICAL THERAPY THORACOLUMBAR EVALUATION   Patient Name: Erica Greer MRN: [id number] DOB:29-Jul-1966, 56 y.o., female Today's Date: 04/23/2022   PT End of Session - 04/23/22 0812     Visit Number 1    Number of Visits 8    Authorization Type [employer]; 30 visits combined; no auth; no copay, no ded.    Authorization - Visit Number 1    Authorization - Number of Visits 30    Progress Note Due on Visit 8    PT Start Time 0810    PT Stop Time 0846    PT Time Calculation (min) 36 min             Past Medical History:  Diagnosis Date   Arthritis    [employer] 01/16/2021   Chronic knee pain    HLD (hyperlipidemia)    HTN (hypertension)    Ovarian cyst    right    Subclinical hypothyroidism    Past Surgical History:  Procedure Laterality Date   COLONOSCOPY WITH PROPOFOL N/A 09/08/2021   Procedure: COLONOSCOPY WITH PROPOFOL;  Surgeon: Daneil Dolin, MD;  [facility];  Service: Endoscopy;  Laterality: N/A;  12:45pm   LAPAROSCOPIC SALPINGO OOPHERECTOMY Right 10/04/2015   Procedure: LAPAROSCOPIC RIGHT SALPINGO OOPHORECTOMY;  Surgeon: Jonnie Kind, MD;  Location: [facility];  Service: Gynecology;  Laterality: Right;   LAPAROSCOPIC UNILATERAL SALPINGECTOMY Left 10/04/2015   Procedure: LAPAROSCOPIC LEFT SALPINGECTOMY;  Surgeon: Jonnie Kind, MD;  Location: [facility];  Service: Gynecology;  Laterality: Left;   OVARIAN CYST REMOVAL Left    POLYPECTOMY  09/08/2021   Procedure: POLYPECTOMY;  Surgeon: Daneil Dolin, MD;  Location: [facility];  Service: Endoscopy;;  cecal; hepatic flexure   TUBAL LIGATION Left    Unilateral   Patient Active Problem List   Diagnosis Date Noted   DDD (degenerative disc disease), lumbar 02/13/2022   Primary insomnia 11/17/2021   Stage 3a chronic kidney disease ([place]) 11/17/2021   Subclinical hypothyroidism 07/18/2021   Vitamin D deficiency 07/18/2021   Rectal pain 05/19/2021   Encounter for general adult  medical examination with abnormal findings 03/15/2021   Primary hypertension 01/18/2021   Osteoarthritis of left knee 01/18/2021   Cervical spondylosis 01/18/2021   Acute midline low back pain without sciatica 01/18/2021   Morbid obesity ([place]) 01/18/2021   Mass of right ovary 09/16/2015    PCP:   Lindell Spar, MD     REFERRING PROVIDER: Marybelle Killings, MD   REFERRING DIAG: LBP, degenerative facets Eval and treat, core strengthening LBP, degenerative facets Eval and treat, core strengthening  Rationale for Evaluation and Treatment Rehabilitation  THERAPY DIAG:  Low back pain, unspecified back pain laterality, unspecified chronicity, unspecified whether sciatica present  Facet arthritis of lumbar region  Other symptoms and signs involving the musculoskeletal system  ONSET DATE: 1.5 years  SUBJECTIVE:  SUBJECTIVE STATEMENT: Pain in the back for several years but worsening noticed in the last 1.5 years. Can only stand about 15-20 min at a time and has to sit down. Dr. Posey Pronto referred her to [facility].  MRI and referred to therapy.  Returns to [place] end of July PERTINENT HISTORY:  OA Left knee pain; sees Dr. Luna Glasgow  PAIN:  Are you having pain? Yes: NPRS scale: 8/10 Pain location: mid to low back Pain description: throbbing Aggravating factors: standing/ walking; has to stand at work Relieving factors: sitting down   PRECAUTIONS: None  WEIGHT BEARING RESTRICTIONS No  FALLS:  Has patient fallen in last 6 months? No  LIVING ENVIRONMENT: Lives with: lives with an adult companion Lives in: House/apartment Stairs: No Has following equipment at home: None  OCCUPATION: works at [employer]: [place] less pain in the back   OBJECTIVE:   DIAGNOSTIC FINDINGS:   CLINICAL DATA:  Acute midline low back pain without sciatica M54.50 (ICD-10-CM). Lumbar radiculopathy, symptoms persist with > 6 wks treatment. DDD (degenerative disc disease), lumbar M51.36 (ICD-10-CM).   EXAM: MRI LUMBAR SPINE WITHOUT CONTRAST   TECHNIQUE: Multiplanar, multisequence MR imaging of the lumbar spine was performed. No intravenous contrast was administered.   COMPARISON:  Radiograph February 13, 2021.   FINDINGS: Segmentation:  Standard.   Alignment:  Small anterolisthesis at L3-4 and L4-5.   Vertebrae:  No fracture, evidence of discitis, or bone lesion.   Conus medullaris and cauda equina: Conus extends to the L1-2 level. Conus and cauda equina appear normal.   Paraspinal and other soft tissues: Negative.   Disc levels:   T12-L1: Tiny central disc protrusion. No spinal canal or neural foraminal stenosis.   L1-2 mild facet degenerative changes. No spinal canal or neural foraminal stenosis.   L2-3: Mild facet degenerative changes. No spinal canal or neural foraminal stenosis.   L3-4: Shallow disc bulge/disc uncovering, hypertrophic facet degenerative changes and ligamentum flavum redundancy resulting in mild spinal canal stenosis. No significant neural foraminal narrowing.   L4-5: Shallow disc bulge and hypertrophic facet degenerative changes. No significant spinal canal or neural foraminal stenosis.   L5-S1: Right foraminal disc protrusion with far lateral disc osteophyte component abutting the exiting right L5 nerve root in the neural foramen and causing mild lateral displacement of the extraforaminal segment of this nerve root (series 8, image 27). Mild facet degenerative changes. Mild right neural foraminal narrowing. No spinal canal stenosis.   IMPRESSION: 1. Right foraminal/far lateral disc osteophyte complex at L5-S1 causing mild displacement of the exiting right L5 nerve root, as detailed above. 2. Advanced facet degenerative changes at L3-4  and L4-5 resulting in trace anterolisthesis at these levels. 3. No high-grade spinal canal or neural foraminal stenosis at any level.  PATIENT SURVEYS:  FOTO 40  COGNITION:  Overall cognitive status: Within functional limits for tasks assessed     SENSATION: WFL   POSTURE: flexed trunk   PALPATION: Tender bilateral paraspinals low thoracic spine to S1  LUMBAR ROM:   Active  A/PROM  eval  Flexion wnl  Extension 90% limited *  Right lateral flexion 20% limited*  Left lateral flexion 20% limited*  Right rotation wfl  Left rotation Wfl   (Blank rows = not tested) * painful   LOWER EXTREMITY MMT:    MMT Right eval Left eval  Hip flexion 5 4+  Hip extension Unable to test cramping Unable to test cramping  Hip abduction    Hip adduction  Hip internal rotation    Hip external rotation    Knee flexion 4+ 4+  Knee extension 5 4+  Ankle dorsiflexion 5 5  Ankle plantarflexion    Ankle inversion    Ankle eversion     (Blank rows = not tested) GAIT: Distance walked: 55 Assistive device utilized: None Level of assistance: Modified independence Comments: takes extra time; slight antalgic gait    TODAY'S TREATMENT  Physical therapy evaluation, HEP instruction, moist heat to low back x 8' to decrease pain   PATIENT EDUCATION:  Education details: HEP instruction, POC Person educated: Patient Education method: Explanation, Demonstration, and Handouts Education comprehension: verbalized understanding, returned demonstration, and needs further education   HOME EXERCISE PROGRAM: Access Code: [email or web address] URL: [email or web address] Date: 04/23/2022 Prepared by: [facility]  Exercises - Static Prone on Elbows  - 3 x daily - 7 x weekly - 1 sets - 1 reps - 5 min hold - Correct Standing Posture  - 1 x daily - 7 x weekly - 3 sets - 10 reps - Seated Correct Posture  - 1 x daily - 7 x weekly - 3 sets - 10 reps  ASSESSMENT:  CLINICAL IMPRESSION: Patient  is a 56  y.o. female who was seen today for physical therapy evaluation and treatment for LBP. She reports ongoing pain for several years worsening in the last 1.5 years.  Patient presents with limited lumbar mobility and lower extremity weakness; impaired posturing that negatively affects her ability to work and to perform tasks around her home. Patient will benefit from skilled PT interventions to address low back pain, decreased lumbar mobility and core weakness, LE weakness, postural and gait training and the instruction, development and modification of HEP.During treatment today, patient becomes emotional; upset about back pain.   OBJECTIVE IMPAIRMENTS Abnormal gait, decreased activity tolerance, decreased endurance, decreased knowledge of condition, decreased mobility, difficulty walking, decreased ROM, decreased strength, hypomobility, impaired perceived functional ability, increased muscle spasms, impaired flexibility, and pain.   ACTIVITY LIMITATIONS carrying, lifting, bending, sitting, standing, squatting, sleeping, stairs, transfers, bed mobility, reach over head, and locomotion level  PARTICIPATION LIMITATIONS: meal prep, cleaning, laundry, shopping, community activity, and occupation  [place] and 1 comorbidity: HBP  are also affecting patient's functional outcome.   REHAB POTENTIAL: Good  CLINICAL DECISION MAKING: Stable/uncomplicated  EVALUATION COMPLEXITY: Low   GOALS: Goals reviewed with patient? No  SHORT TERM GOALS: Target date: 05/07/2022   patient will be independent with initial HEP  Baseline: Goal status: INITIAL  2.  Patient will demonstrate good sitting and standing posture to decrease back strain Baseline: forward head, rounded shoulders, flexed trunk Goal status: INITIAL  LONG TERM GOALS: Target date: 05/21/2022  Patient will report at least 50% improvement in overall symptoms and/or function to demonstrate improved functional  mobility  Baseline:  Goal status: INITIAL  2.   Patient will improve FOTO score to predicted value to demonstrate improved functional mobility. Baseline:  Goal status: INITIAL  3.  Patient will be able to stand x 30 min to cook a meal at home without back pain greater than 2/10 Baseline: 15-20 minutes Goal status: INITIAL  4.  Patient will be independent in self management strategies to improve quality of life and functional outcomes.  Baseline:  Goal status: INITIAL  5.  Patient will improve lumbar extension AROM to 50% available or better to improve ability to reach overhead for tasks at work. Baseline:  Active  A/PROM  eval  Flexion wnl  Extension 90% limited *  Right lateral flexion 20% limited*  Left lateral flexion 20% limited*  Right rotation wfl  Left rotation Wfl   Goal status: INITIAL   PLAN: PT FREQUENCY: 2x/week  PT DURATION: 4 weeks  PLANNED INTERVENTIONS: Therapeutic exercises, Therapeutic activity, Neuromuscular re-education, Balance training, Gait training, Patient/Family education, Joint manipulation, Joint mobilization, Stair training, Vestibular training, Canalith repositioning, Orthotic/Fit training, DME instructions, Aquatic Therapy, Dry Needling, Electrical stimulation, Spinal manipulation, Spinal mobilization, Cryotherapy, Moist heat, Taping, Traction, Ultrasound, Parrafin, Contrast bath, Biofeedback, Ionotophoresis '4mg'$ /ml Dexamethasone, Manual therapy, and Re-evaluation.  PLAN FOR NEXT SESSION: Review goals and HEP; progress lumbar mobility and core strengthening as able; hip extension, bridge, abdominal bracing.    8:58 AM, 04/23/22 Aprill Banko Small Berit Raczkowski MPT [facility] [place] [phone]

## 2022-04-23 ENCOUNTER — Encounter: Payer: Self-pay | Admitting: Internal Medicine

## 2022-04-23 ENCOUNTER — Ambulatory Visit (HOSPITAL_COMMUNITY): Payer: 59 | Attending: Orthopaedic Surgery

## 2022-04-23 DIAGNOSIS — G8929 Other chronic pain: Secondary | ICD-10-CM | POA: Diagnosis not present

## 2022-04-23 DIAGNOSIS — M545 Low back pain, unspecified: Secondary | ICD-10-CM | POA: Insufficient documentation

## 2022-04-23 DIAGNOSIS — M47816 Spondylosis without myelopathy or radiculopathy, lumbar region: Secondary | ICD-10-CM | POA: Insufficient documentation

## 2022-04-23 DIAGNOSIS — R29898 Other symptoms and signs involving the musculoskeletal system: Secondary | ICD-10-CM | POA: Insufficient documentation

## 2022-04-26 ENCOUNTER — Encounter: Payer: Self-pay | Admitting: Family Medicine

## 2022-04-26 ENCOUNTER — Other Ambulatory Visit: Payer: Self-pay | Admitting: Family Medicine

## 2022-04-26 ENCOUNTER — Ambulatory Visit (INDEPENDENT_AMBULATORY_CARE_PROVIDER_SITE_OTHER): Payer: 59 | Admitting: Family Medicine

## 2022-04-26 VITALS — BP 129/82 | HR 65 | Resp 16 | Ht 64.0 in | Wt 251.0 lb

## 2022-04-26 DIAGNOSIS — J309 Allergic rhinitis, unspecified: Secondary | ICD-10-CM | POA: Diagnosis not present

## 2022-04-26 DIAGNOSIS — M25522 Pain in left elbow: Secondary | ICD-10-CM

## 2022-04-26 DIAGNOSIS — I10 Essential (primary) hypertension: Secondary | ICD-10-CM

## 2022-04-26 DIAGNOSIS — M5136 Other intervertebral disc degeneration, lumbar region: Secondary | ICD-10-CM

## 2022-04-26 MED ORDER — MONTELUKAST SODIUM 10 MG PO TABS: 10.0000 mg | ORAL_TABLET | Freq: Every day | ORAL | 1 refills | Status: DC

## 2022-04-26 MED ORDER — CHLORPHENIRAMINE MALEATE 4 MG PO TABS: ORAL_TABLET | ORAL | 0 refills | Status: DC

## 2022-04-26 NOTE — Progress Notes (Unsigned)
   Erica Greer     MRN: [id number]      DOB: 05-01-1966   HPI Erica Greer is here with a 3 day h/o increased post nasal drainage , and tickle / sore throat, also watetry eyes. No fever, chills, sinus pressure , ear pain or cough 1 month h/o anterior left el bow pain and tenderness aggravated by direct pressure and movement rated at 7 ROS Denies recent fever or chills. Denies sinus pressure, nasal congestion, ear pain or sore throat. Denies chest congestion, productive cough or wheezing. Denies chest pains, palpitations and leg swelling Denies abdominal pain, nausea, vomiting,diarrhea or constipation.   Denies dysuria, frequency, hesitancy or incontinence. Denies joint pain, swelling and limitation in mobility. Denies headaches, seizures, numbness, or tingling. Denies depression, anxiety or insomnia. Denies skin break down or rash.   PE  BP 129/82   Pulse 65   Resp 16   Ht '5\' 4"'$  (1.626 m)   Wt 251 lb (113.9 kg)   LMP 02/11/2015   SpO2 99%   BMI 43.08 kg/m   Patient alert and oriented and in no cardiopulmonary distress.  HEENT: No facial asymmetry, EOMI,     Neck supple .  Chest: Clear to auscultation bilaterally.  CVS: S1, S2 no murmurs, no S3.Regular rate.  ABD: Soft non tender.   Ext: No edema  MS: Adequate ROM spine, shoulders, hips and knees.  Skin: Intact, no ulcerations or rash noted.  Psych: Good eye contact, normal affect. Memory intact not anxious or depressed appearing.  CNS: CN 2-12 intact, power,  normal throughout.no focal deficits noted.   Assessment & Plan  ***

## 2022-04-26 NOTE — Assessment & Plan Note (Signed)
Singulair, chlorpheniramine and predniosone short term

## 2022-04-26 NOTE — Patient Instructions (Signed)
F/U with Dr Posey Pronto as before, cal;l if you need to be seen sooner  You are referred to Ortho re left elbow pain  Continue to follow with ortho re back pain  For allergic rhinitis/ stuffy nose, singulair and short term chlorpheniramine are prescribed  It is important that you exercise regularly at least 30 minutes 5 times a week. If you develop chest pain, have severe difficulty breathing, or feel very tired, stop exercising immediately and seek medical attention    Thanks for choosing [facility], we consider it a privelige to serve you.

## 2022-05-01 ENCOUNTER — Encounter (HOSPITAL_COMMUNITY): Payer: 59 | Admitting: Physical Therapy

## 2022-05-03 ENCOUNTER — Ambulatory Visit (HOSPITAL_COMMUNITY): Payer: 59 | Admitting: Physical Therapy

## 2022-05-03 ENCOUNTER — Telehealth (HOSPITAL_COMMUNITY): Payer: Self-pay | Admitting: Physical Therapy

## 2022-05-03 NOTE — Telephone Encounter (Signed)
Today was pt's 2nd consecutive NS for physical therapy.  Pt did not show/call on 6/20 and today on 6/22.  Called given number without answer or voice mail available.  Pt has one remaining appointment next week, 7/6 and if she fails to show she will be discharged.  All appts scheduled after 7/6 were cancelled according to NS policy.  Teena Irani, PTA/CLT [place] Ph: [phone]

## 2022-05-06 ENCOUNTER — Encounter: Payer: Self-pay | Admitting: Family Medicine

## 2022-05-06 NOTE — Assessment & Plan Note (Signed)
Continue mnagament directed by Ortho

## 2022-05-06 NOTE — Assessment & Plan Note (Signed)
  Patient re-educated about  the importance of commitment to a  minimum of 150 minutes of exercise per week as able.  The importance of healthy food choices with portion control discussed, as well as eating regularly and within a 12 hour window most days. The need to choose "clean , green" food 50 to 75% of the time is discussed, as well as to make water the primary drink and set a goal of 64 ounces water daily.       04/26/2022    8:38 AM 03/29/2022   10:07 AM 03/21/2022    9:39 AM  Weight /BMI  Weight 251 lb 254 lb 252 lb 6.4 oz  Height 5\' 4"  (1.626 m) 5\' 4"  (1.626 m) 5\' 4"  (1.626 m)  BMI 43.08 kg/m2 43.6 kg/m2 43.32 kg/m2

## 2022-05-10 ENCOUNTER — Ambulatory Visit: Payer: 59 | Admitting: Orthopaedic Surgery

## 2022-05-17 ENCOUNTER — Encounter (HOSPITAL_COMMUNITY): Payer: Self-pay

## 2022-05-17 ENCOUNTER — Telehealth (HOSPITAL_COMMUNITY): Payer: Self-pay | Admitting: Physical Therapy

## 2022-05-17 ENCOUNTER — Encounter (HOSPITAL_COMMUNITY): Payer: 59 | Admitting: Physical Therapy

## 2022-05-17 NOTE — Telephone Encounter (Signed)
Pt now with 3 consecutive NS and no further appt scheduled.  Pt will now be discharged from therapy per NS policy.  Teena Irani, PTA/CLT [place] Ph: [phone]

## 2022-05-17 NOTE — Therapy (Signed)
Patient discharged after 3 no show appointments and no response to calls from the clinic.  2:17 PM, 05/17/22 Orilla Templeman Small Caulder Wehner MPT [place] [facility] [facility] [place] [phone]

## 2022-05-21 ENCOUNTER — Encounter (HOSPITAL_COMMUNITY): Payer: 59

## 2022-05-23 ENCOUNTER — Encounter (HOSPITAL_COMMUNITY): Payer: 59 | Admitting: Physical Therapy

## 2022-05-31 ENCOUNTER — Ambulatory Visit: Payer: 59 | Admitting: Orthopaedic Surgery

## 2022-08-09 ENCOUNTER — Other Ambulatory Visit: Payer: Self-pay | Admitting: Internal Medicine

## 2022-08-09 DIAGNOSIS — I10 Essential (primary) hypertension: Secondary | ICD-10-CM

## 2022-08-29 ENCOUNTER — Encounter: Payer: Self-pay | Admitting: Orthopaedic Surgery

## 2022-08-29 ENCOUNTER — Ambulatory Visit (INDEPENDENT_AMBULATORY_CARE_PROVIDER_SITE_OTHER): Payer: 59 | Admitting: Orthopaedic Surgery

## 2022-08-29 DIAGNOSIS — M25562 Pain in left knee: Secondary | ICD-10-CM

## 2022-08-29 DIAGNOSIS — G8929 Other chronic pain: Secondary | ICD-10-CM

## 2022-08-29 MED ORDER — METHYLPREDNISOLONE ACETATE 40 MG/ML IJ SUSP
40.0000 mg | Freq: Once | INTRAMUSCULAR | Status: AC
  Administered 2022-08-29: 40 mg via INTRA_ARTICULAR

## 2022-08-29 MED ORDER — TRAMADOL HCL 50 MG PO TABS: 50.0000 mg | ORAL_TABLET | Freq: Four times a day (QID) | ORAL | 0 refills | Status: AC | PRN

## 2022-08-29 NOTE — Progress Notes (Signed)
PROCEDURE NOTE:  The patient requests injections of the left knee , verbal consent was obtained.  The left knee was prepped appropriately after time out was performed.   Sterile technique was observed and injection of 1 cc of DepoMedrol 40 mg with several cc's of plain xylocaine. Anesthesia was provided by ethyl chloride and a 20-gauge needle was used to inject the knee area. The injection was tolerated well.  A band aid dressing was applied.  The patient was advised to apply ice later today and tomorrow to the injection sight as needed.  Encounter Diagnosis  Name Primary?   Chronic pain of left knee Yes   Return prn  Call if any problem.  Precautions discussed.  I will call in Ultram for pain.  I have reviewed the [facility] web site prior to prescribing narcotic medicine for this patient.    Electronically Signed Sanjuana Kava, MD 10/18/202310:21 AM

## 2022-09-24 ENCOUNTER — Ambulatory Visit: Payer: 59 | Admitting: Internal Medicine

## 2022-10-09 ENCOUNTER — Ambulatory Visit: Payer: 59 | Admitting: Internal Medicine

## 2022-10-11 ENCOUNTER — Other Ambulatory Visit: Payer: Self-pay | Admitting: Internal Medicine

## 2022-10-11 DIAGNOSIS — M545 Low back pain, unspecified: Secondary | ICD-10-CM

## 2022-10-11 DIAGNOSIS — M5136 Other intervertebral disc degeneration, lumbar region: Secondary | ICD-10-CM

## 2022-10-11 MED ORDER — CYCLOBENZAPRINE HCL 10 MG PO TABS: 10.0000 mg | ORAL_TABLET | Freq: Three times a day (TID) | ORAL | 0 refills | Status: DC | PRN

## 2022-10-11 MED ORDER — NAPROXEN 500 MG PO TABS: 500.0000 mg | ORAL_TABLET | Freq: Two times a day (BID) | ORAL | 1 refills | Status: DC

## 2022-10-25 ENCOUNTER — Ambulatory Visit: Payer: 59 | Admitting: Internal Medicine

## 2022-10-25 ENCOUNTER — Encounter: Payer: Self-pay | Admitting: Internal Medicine

## 2022-12-21 ENCOUNTER — Encounter (HOSPITAL_COMMUNITY): Payer: Self-pay | Admitting: *Deleted

## 2022-12-21 ENCOUNTER — Emergency Department (HOSPITAL_COMMUNITY)
Admission: EM | Admit: 2022-12-21 | Discharge: 2022-12-22 | Disposition: A | Discharge status: Home / Self Care | Payer: 59 | Attending: Emergency Medicine | Admitting: Emergency Medicine

## 2022-12-21 ENCOUNTER — Other Ambulatory Visit: Payer: Self-pay

## 2022-12-21 DIAGNOSIS — G8929 Other chronic pain: Secondary | ICD-10-CM | POA: Insufficient documentation

## 2022-12-21 DIAGNOSIS — Z79899 Other long term (current) drug therapy: Secondary | ICD-10-CM | POA: Diagnosis not present

## 2022-12-21 DIAGNOSIS — M545 Low back pain, unspecified: Secondary | ICD-10-CM | POA: Diagnosis present

## 2022-12-21 DIAGNOSIS — I10 Essential (primary) hypertension: Secondary | ICD-10-CM | POA: Insufficient documentation

## 2022-12-21 MED ORDER — DEXTROSE 5 % IV SOLN
1000.0000 mg | Freq: Once | INTRAVENOUS | Status: AC
  Administered 2022-12-22: 1000 mg via INTRAVENOUS
  Filled 2022-12-21: qty 10

## 2022-12-21 MED ORDER — ACETAMINOPHEN 325 MG PO TABS
650.0000 mg | ORAL_TABLET | Freq: Once | ORAL | Status: AC
  Administered 2022-12-22: 650 mg via ORAL
  Filled 2022-12-21: qty 2

## 2022-12-21 MED ORDER — KETOROLAC TROMETHAMINE 30 MG/ML IJ SOLN
30.0000 mg | Freq: Once | INTRAMUSCULAR | Status: AC
  Administered 2022-12-22: 30 mg via INTRAVENOUS
  Filled 2022-12-21: qty 1

## 2022-12-21 NOTE — ED Triage Notes (Signed)
Pt with lower back pain for couple of months, gradually gotten worse. Denies any radiation down either leg.

## 2022-12-21 NOTE — ED Provider Notes (Signed)
[facility] Provider Note   CSN: [id number] Arrival date & time: 12/21/22  2229     History {Add pertinent medical, surgical, social history, OB history to HPI:1} Chief Complaint  Patient presents with  . Back Pain    Erica Greer is a 57 y.o. female.  The history is provided by the patient.  Back Pain She has history of hypertension, hyperlipidemia, chronic back pain and comes in because of worsening low back pain over the last 2 weeks.  She denies any unusual trauma or overuse.  She has been using topical measures like IcyHot without any relief.  She had been sent for physical therapy last year which did not give her any relief.  She denies any radiation of pain and denies any weakness or numbness.  She denies bowel or bladder dysfunction.   Home Medications Prior to Admission medications   Medication Sig Start Date End Date Taking? Authorizing Provider  atorvastatin (LIPITOR) 20 MG tablet TAKE 1 TABLET(20 MG) BY MOUTH DAILY 01/11/22   Lindell Spar, MD  chlorpheniramine (CHLOR-TRIMETON) 4 MG tablet Take one tablet by mouth once daily for 3 days, then as needed, for excessive nasal congestion and drainage 04/26/22   Fayrene Helper, MD  cyclobenzaprine (FLEXERIL) 10 MG tablet Take 1 tablet (10 mg total) by mouth 3 (three) times daily as needed for muscle spasms. 10/11/22   Lindell Spar, MD  docusate sodium (COLACE) 100 MG capsule Take 1 capsule (100 mg total) by mouth daily as needed for mild constipation. 07/31/21   Lindell Spar, MD  hydrOXYzine (VISTARIL) 25 MG capsule Take 1 capsule (25 mg total) by mouth at bedtime as needed (Insomnia). 11/17/21   Lindell Spar, MD  montelukast (SINGULAIR) 10 MG tablet Take 1 tablet (10 mg total) by mouth at bedtime. 04/26/22   Fayrene Helper, MD  naproxen (NAPROSYN) 500 MG tablet Take 1 tablet (500 mg total) by mouth 2 (two) times daily with a meal. 10/11/22   Lindell Spar, MD   telmisartan-hydrochlorothiazide (MICARDIS HCT) 40-12.5 MG tablet TAKE 1 TABLET BY MOUTH DAILY 08/09/22   Lindell Spar, MD      Allergies    Hydrocodone    Review of Systems   Review of Systems  Musculoskeletal:  Positive for back pain.  All other systems reviewed and are negative.   Physical Exam Updated Vital Signs BP 134/66 (BP Location: Right Arm)   Pulse 73   Temp 98.2 F (36.8 C) (Oral)   Resp 18   Ht 5' 4"$  (1.626 m)   Wt 114.8 kg   LMP 02/11/2015   SpO2 100%   BMI 43.45 kg/m  Physical Exam Vitals and nursing note reviewed.  57 year old female, resting comfortably and in no acute distress. Vital signs are normal. Oxygen saturation is 100%, which is normal. Head is normocephalic and atraumatic. PERRLA, EOMI. Oropharynx is clear. Neck is nontender and supple without adenopathy or JVD. Back is moderately tender in the mid and lower lumbar area.  There is bilateral paralumbar spasm and tenderness which is worse on the right.  Straight leg raise is negative. Lungs are clear without rales, wheezes, or rhonchi. Chest is nontender. Heart has regular rate and rhythm without murmur. Abdomen is soft, flat, nontender without masses or hepatosplenomegaly and peristalsis is normoactive. Extremities have no cyanosis or edema, full range of motion is present. Skin is warm and dry without rash. Neurologic: Mental status  is normal, cranial nerves are intact, strength is 5/5 in all 4 extremities, no sensory deficits.  ED Results / Procedures / Treatments   Labs (all labs ordered are listed, but only abnormal results are displayed) Labs Reviewed - No data to display  EKG None  Radiology No results found.  Procedures Procedures  {Document cardiac monitor, telemetry assessment procedure when appropriate:1}  Medications Ordered in ED Medications - No data to display  ED Course/ Medical Decision Making/ A&P   {   Click here for ABCD2, HEART and other calculatorsREFRESH  Note before signing :1}                          Medical Decision Making  Exacerbation of chronic low back pain.  No evidence of any neurologic injury, spinal cord compression, cauda equina syndrome.  No indication for imaging.  I have ordered intravenous ketorolac and methocarbamol, oral acetaminophen.  I reviewed her past records, and on 10/11/2022 she had been prescribed naproxen and cyclobenzaprine which she says did give some slight, temporary relief but she has run out of both prescriptions.  {Document critical care time when appropriate:1} {Document review of labs and clinical decision tools ie heart score, Chads2Vasc2 etc:1}  {Document your independent review of radiology images, and any outside records:1} {Document your discussion with family members, caretakers, and with consultants:1} {Document social determinants of health affecting pt's care:1} {Document your decision making why or why not admission, treatments were needed:1} Final Clinical Impression(s) / ED Diagnoses Final diagnoses:  None    Rx / DC Orders ED Discharge Orders     None

## 2022-12-22 MED ORDER — METHOCARBAMOL 1000 MG/10ML IJ SOLN
INTRAMUSCULAR | Status: AC
  Filled 2022-12-22: qty 10

## 2022-12-22 MED ORDER — NAPROXEN 500 MG PO TABS: 500.0000 mg | ORAL_TABLET | Freq: Two times a day (BID) | ORAL | 1 refills | Status: DC

## 2022-12-22 MED ORDER — METHOCARBAMOL 500 MG PO TABS: 500.0000 mg | ORAL_TABLET | Freq: Four times a day (QID) | ORAL | 1 refills | Status: DC | PRN

## 2022-12-22 NOTE — Discharge Instructions (Signed)
Apply ice for 30 minutes at a time, 4 times a day.  In addition to the prescribed medications, you may take acetaminophen for additional pain relief.

## 2023-01-10 ENCOUNTER — Encounter: Payer: Self-pay | Admitting: Radiology

## 2023-01-30 ENCOUNTER — Encounter: Payer: Self-pay | Admitting: Internal Medicine

## 2023-01-30 ENCOUNTER — Ambulatory Visit (INDEPENDENT_AMBULATORY_CARE_PROVIDER_SITE_OTHER): Payer: 59 | Admitting: Internal Medicine

## 2023-01-30 VITALS — BP 148/92 | HR 71 | Ht 64.0 in | Wt 253.8 lb

## 2023-01-30 DIAGNOSIS — I10 Essential (primary) hypertension: Secondary | ICD-10-CM

## 2023-01-30 DIAGNOSIS — E782 Mixed hyperlipidemia: Secondary | ICD-10-CM | POA: Diagnosis not present

## 2023-01-30 DIAGNOSIS — M5136 Other intervertebral disc degeneration, lumbar region: Secondary | ICD-10-CM

## 2023-01-30 DIAGNOSIS — R7303 Prediabetes: Secondary | ICD-10-CM

## 2023-01-30 DIAGNOSIS — E559 Vitamin D deficiency, unspecified: Secondary | ICD-10-CM

## 2023-01-30 MED ORDER — TELMISARTAN-HCTZ 40-12.5 MG PO TABS: 1.0000 | ORAL_TABLET | Freq: Every day | ORAL | 5 refills | Status: DC

## 2023-01-30 MED ORDER — MELOXICAM 7.5 MG PO TABS: 7.5000 mg | ORAL_TABLET | Freq: Every day | ORAL | 2 refills | Status: DC

## 2023-01-30 MED ORDER — ATORVASTATIN CALCIUM 20 MG PO TABS: 20.0000 mg | ORAL_TABLET | Freq: Every day | ORAL | 1 refills | Status: DC

## 2023-01-30 MED ORDER — AMLODIPINE BESYLATE 5 MG PO TABS: 5.0000 mg | ORAL_TABLET | Freq: Every day | ORAL | 1 refills | Status: DC

## 2023-01-30 NOTE — Assessment & Plan Note (Addendum)
Diet modification and moderate exercise advised Will discuss about Bariatric surgery option later. 

## 2023-01-30 NOTE — Assessment & Plan Note (Signed)
BP Readings from Last 1 Encounters:  01/30/23 (!) 148/92   Uncontrolled with Telmisartan-HCTZ Added amlodipine 5 mg daily Counseled for compliance with the medications Advised DASH diet and moderate exercise/walking, at least 150 mins/week

## 2023-01-30 NOTE — Patient Instructions (Addendum)
Please start taking Amlodipine 5 mg in addition to Telmisartan-HCTZ 40-12.5 mg once daily for blood pressure.  Please take Atorvastatin for cholesterol.  Please take Meloxicam for back pain. Please take Robaxin as needed for muscle spasms.  Please avoid heavy lifting (> 15 lbs) and frequent bending.  Please continue to follow low salt diet and perform moderate exercise/walking at least 150 mins/week.  Please get fasting blood tests done before the next visit.

## 2023-01-30 NOTE — Progress Notes (Signed)
Established Patient Office Visit  Subjective:  Patient ID: Erica Greer, female    DOB: 1965-12-26  Age: 57 y.o. MRN: [id number]  CC:  Chief Complaint  Patient presents with   Back Pain    Patient states her whole back hurts. She was going to physical therapy and does not feel it was helping her.    HPI Erica Greer is a 57 y.o. female with past medical history of HTN, chronic low back pain, OA of left knee and morbid obesity who presents for f/u of her chronic medical conditions.  HTN: Her BP was elevated today.  She has been taking telmisartan-HCTZ regularly.  She denies any headache, dizziness, chest pain, dyspnea or palpitations currently.  Chronic low back pain: She reports chronic low back pain, which is constant, worse with bending and heavy lifting at her workplace and radiates towards her LE.  She denies any numbness or tingling of the LE currently.  MRI of the lumbar spine showed DDD of lumbar spine and mild neural foraminal narrowing.  She has tried physical therapy without much relief.  She was given naproxen and Robaxin from ER recently, but did not get much relief.    Past Medical History:  Diagnosis Date   Arthritis    Phreesia 01/16/2021   Chronic knee pain    HLD (hyperlipidemia)    HTN (hypertension)    Ovarian cyst    right    Subclinical hypothyroidism     Past Surgical History:  Procedure Laterality Date   COLONOSCOPY WITH PROPOFOL N/A 09/08/2021   Procedure: COLONOSCOPY WITH PROPOFOL;  Surgeon: Daneil Dolin, MD;  [facility];  Service: Endoscopy;  Laterality: N/A;  12:45pm   LAPAROSCOPIC SALPINGO OOPHERECTOMY Right 10/04/2015   Procedure: LAPAROSCOPIC RIGHT SALPINGO OOPHORECTOMY;  Surgeon: Jonnie Kind, MD;  Location: [facility];  Service: Gynecology;  Laterality: Right;   LAPAROSCOPIC UNILATERAL SALPINGECTOMY Left 10/04/2015   Procedure: LAPAROSCOPIC LEFT SALPINGECTOMY;  Surgeon: Jonnie Kind, MD;  Location: [facility];  Service:  Gynecology;  Laterality: Left;   OVARIAN CYST REMOVAL Left    POLYPECTOMY  09/08/2021   Procedure: POLYPECTOMY;  Surgeon: Daneil Dolin, MD;  [facility]: [facility];  Service: Endoscopy;;  cecal; hepatic flexure   TUBAL LIGATION Left    Unilateral    Family History  Problem Relation Age of Onset   Diabetes Mother    Cancer Father    Seizures Sister    Colon cancer Neg Hx    Colon polyps Neg Hx     Social History   Socioeconomic History   Marital status: Legally Separated    Spouse name: Not on file   Number of children: Not on file   Years of education: Not on file   Highest education level: Not on file  Occupational History   Not on file  Tobacco Use   Smoking status: Never   Smokeless tobacco: Never  Substance and Sexual Activity   Alcohol use: No   Drug use: No   Sexual activity: Yes    Birth control/protection: Surgical  Other Topics Concern   Not on file  Social History Narrative   Not on file   Social Determinants of Health   Financial Resource Strain: Not on file  Food Insecurity: Not on file  Transportation Needs: Not on file  Physical Activity: Not on file  Stress: Not on file  Social Connections: Not on file  Intimate Partner Violence: Not on file  Outpatient Medications Prior to Visit  Medication Sig Dispense Refill   chlorpheniramine (CHLOR-TRIMETON) 4 MG tablet Take one tablet by mouth once daily for 3 days, then as needed, for excessive nasal congestion and drainage 14 tablet 0   docusate sodium (COLACE) 100 MG capsule Take 1 capsule (100 mg total) by mouth daily as needed for mild constipation. 30 capsule 0   hydrOXYzine (VISTARIL) 25 MG capsule Take 1 capsule (25 mg total) by mouth at bedtime as needed (Insomnia). 30 capsule 2   methocarbamol (ROBAXIN) 500 MG tablet Take 1-2 tablets (500-1,000 mg total) by mouth every 6 (six) hours as needed for muscle spasms. 100 tablet 1   montelukast (SINGULAIR) 10 MG tablet Take 1 tablet (10 mg total)  by mouth at bedtime. 30 tablet 1   atorvastatin (LIPITOR) 20 MG tablet TAKE 1 TABLET(20 MG) BY MOUTH DAILY 90 tablet 1   naproxen (NAPROSYN) 500 MG tablet Take 1 tablet (500 mg total) by mouth 2 (two) times daily. 30 tablet 1   telmisartan-hydrochlorothiazide (MICARDIS HCT) 40-12.5 MG tablet TAKE 1 TABLET BY MOUTH DAILY 30 tablet 2   No facility-administered medications prior to visit.    Allergies  Allergen Reactions   Hydrocodone Nausea And Vomiting    ROS Review of Systems  Constitutional:  Negative for chills and fever.  HENT:  Negative for congestion, sinus pressure, sinus pain and sore throat.   Respiratory:  Negative for cough and shortness of breath.   Cardiovascular:  Negative for chest pain and palpitations.  Gastrointestinal:  Positive for constipation. Negative for nausea and vomiting.  Genitourinary:  Negative for dysuria and hematuria.  Musculoskeletal:  Positive for back pain. Negative for neck pain and neck stiffness.  Skin:  Negative for rash.  Neurological:  Negative for dizziness and weakness.  Psychiatric/Behavioral:  Positive for sleep disturbance. Negative for agitation and behavioral problems.       Objective:    Physical Exam Vitals reviewed.  Constitutional:      General: She is not in acute distress.    Appearance: She is obese. She is not diaphoretic.  HENT:     Head: Normocephalic and atraumatic.     Nose: Nose normal.     Mouth/Throat:     Mouth: Mucous membranes are moist.  Eyes:     General: No scleral icterus.    Extraocular Movements: Extraocular movements intact.  Cardiovascular:     Rate and Rhythm: Normal rate and regular rhythm.     Pulses: Normal pulses.     Heart sounds: Normal heart sounds. No murmur heard. Pulmonary:     Breath sounds: Normal breath sounds. No wheezing or rales.  Musculoskeletal:        General: Tenderness (Lumbar spine area) present.     Cervical back: Neck supple. No tenderness.     Right lower leg: No  edema.     Left lower leg: No edema.  Skin:    General: Skin is warm.     Findings: No rash.  Neurological:     General: No focal deficit present.     Mental Status: She is alert and oriented to person, place, and time.     Cranial Nerves: No cranial nerve deficit.     Sensory: No sensory deficit.     Motor: No weakness.  Psychiatric:        Mood and Affect: Mood normal.        Behavior: Behavior normal.     BP (!) 148/92 (BP  Location: Right Arm, Cuff Size: Normal)   Pulse 71   Ht 5\' 4"  (1.626 m)   Wt 253 lb 12.8 oz (115.1 kg)   LMP 02/11/2015   SpO2 99%   BMI 43.56 kg/m  Wt Readings from Last 3 Encounters:  01/30/23 253 lb 12.8 oz (115.1 kg)  12/21/22 253 lb 2 oz (114.8 kg)  04/26/22 251 lb (113.9 kg)    Lab Results  Component Value Date   TSH 2.380 03/21/2022   Lab Results  Component Value Date   WBC 10.8 03/21/2022   HGB 10.6 (L) 03/21/2022   HCT 32.5 (L) 03/21/2022   MCV 77 (L) 03/21/2022   PLT 205 03/21/2022   Lab Results  Component Value Date   NA 144 03/21/2022   K 4.7 03/21/2022   CO2 24 03/21/2022   GLUCOSE 91 03/21/2022   BUN 14 03/21/2022   CREATININE 1.05 (H) 03/21/2022   BILITOT <0.2 03/21/2022   ALKPHOS 59 03/21/2022   AST 14 03/21/2022   ALT 11 03/21/2022   PROT 6.6 03/21/2022   ALBUMIN 4.1 03/21/2022   CALCIUM 9.3 03/21/2022   ANIONGAP 4 (L) 12/28/2020   EGFR 62 03/21/2022   Lab Results  Component Value Date   CHOL 169 03/21/2022   Lab Results  Component Value Date   HDL 53 03/21/2022   Lab Results  Component Value Date   LDLCALC 90 03/21/2022   Lab Results  Component Value Date   TRIG 150 (H) 03/21/2022   Lab Results  Component Value Date   CHOLHDL 3.2 03/21/2022   Lab Results  Component Value Date   HGBA1C 5.5 03/21/2022      Assessment & Plan:   Problem List Items Addressed This Visit       Cardiovascular and Mediastinum   Primary hypertension - Primary    BP Readings from Last 1 Encounters:  01/30/23  (!) 148/92  Uncontrolled with Telmisartan-HCTZ Added amlodipine 5 mg daily Counseled for compliance with the medications Advised DASH diet and moderate exercise/walking, at least 150 mins/week      Relevant Medications   telmisartan-hydrochlorothiazide (MICARDIS HCT) 40-12.5 MG tablet   amLODipine (NORVASC) 5 MG tablet   atorvastatin (LIPITOR) 20 MG tablet   Other Relevant Orders   TSH   CMP14+EGFR   CBC with Differential/Platelet     Musculoskeletal and Integument   DDD (degenerative disc disease), lumbar    Noted on MRI of the lumbar spine - disc osteophyte complex causing nerve root displacement at L5 Referred to Spine surgery Mobic as needed for pain for now, would avoid for long-term due to CKD Robaxin as needed for muscle spasms Avoid heavy lifting and frequent bending Work note provided to avoid heavy lifting      Relevant Medications   meloxicam (MOBIC) 7.5 MG tablet   Other Relevant Orders   Ambulatory referral to Spine Surgery   CBC with Differential/Platelet     Other   Morbid obesity ([place])    Diet modification and moderate exercise advised Will discuss about Bariatric surgery option later      Relevant Orders   CBC with Differential/Platelet   Vitamin D deficiency   Relevant Orders   VITAMIN D 25 Hydroxy (Vit-D Deficiency, Fractures)   Mixed hyperlipidemia    On Lipitor      Relevant Medications   telmisartan-hydrochlorothiazide (MICARDIS HCT) 40-12.5 MG tablet   amLODipine (NORVASC) 5 MG tablet   atorvastatin (LIPITOR) 20 MG tablet   Other Relevant Orders  Lipid panel   CBC with Differential/Platelet   Other Visit Diagnoses     Prediabetes       Relevant Orders   Hemoglobin A1c   CMP14+EGFR   CBC with Differential/Platelet       Meds ordered this encounter  Medications   meloxicam (MOBIC) 7.5 MG tablet    Sig: Take 1 tablet (7.5 mg total) by mouth daily.    Dispense:  30 tablet    Refill:  2   telmisartan-hydrochlorothiazide  (MICARDIS HCT) 40-12.5 MG tablet    Sig: Take 1 tablet by mouth daily.    Dispense:  30 tablet    Refill:  5   amLODipine (NORVASC) 5 MG tablet    Sig: Take 1 tablet (5 mg total) by mouth daily.    Dispense:  90 tablet    Refill:  1   atorvastatin (LIPITOR) 20 MG tablet    Sig: Take 1 tablet (20 mg total) by mouth at bedtime.    Dispense:  90 tablet    Refill:  1    Follow-up: Return in about 6 weeks (around 03/13/2023) for Annual physical.    Lindell Spar, MD

## 2023-01-30 NOTE — Assessment & Plan Note (Deleted)
Last vitamin D Lab Results  Component Value Date   VD25OH 14.5 (L) 03/21/2022   Had started Vitamin D 50,000 IU qw

## 2023-01-30 NOTE — Assessment & Plan Note (Signed)
Noted on MRI of the lumbar spine - disc osteophyte complex causing nerve root displacement at L5 Referred to Spine surgery Mobic as needed for pain for now, would avoid for long-term due to CKD Robaxin as needed for muscle spasms Avoid heavy lifting and frequent bending Work note provided to avoid heavy lifting

## 2023-01-30 NOTE — Assessment & Plan Note (Signed)
On Lipitor 

## 2023-02-05 ENCOUNTER — Other Ambulatory Visit: Payer: Self-pay | Admitting: Internal Medicine

## 2023-02-05 ENCOUNTER — Encounter: Payer: Self-pay | Admitting: Internal Medicine

## 2023-02-05 DIAGNOSIS — M5136 Other intervertebral disc degeneration, lumbar region: Secondary | ICD-10-CM

## 2023-02-05 MED ORDER — PREDNISONE 10 MG (21) PO TBPK: ORAL_TABLET | ORAL | 0 refills | Status: DC

## 2023-03-16 ENCOUNTER — Other Ambulatory Visit: Payer: Self-pay | Admitting: Internal Medicine

## 2023-03-16 DIAGNOSIS — M5136 Other intervertebral disc degeneration, lumbar region: Secondary | ICD-10-CM

## 2023-03-22 ENCOUNTER — Encounter: Payer: 59 | Admitting: Internal Medicine

## 2023-04-29 ENCOUNTER — Other Ambulatory Visit: Payer: Self-pay | Admitting: Internal Medicine

## 2023-04-29 ENCOUNTER — Encounter: Payer: 59 | Admitting: Internal Medicine

## 2023-04-29 DIAGNOSIS — M5136 Other intervertebral disc degeneration, lumbar region: Secondary | ICD-10-CM

## 2023-05-06 ENCOUNTER — Encounter: Payer: Self-pay | Admitting: Internal Medicine

## 2023-05-09 NOTE — Telephone Encounter (Signed)
Called patient left voicemail to contact our office to schedule an in office

## 2023-05-20 ENCOUNTER — Ambulatory Visit (INDEPENDENT_AMBULATORY_CARE_PROVIDER_SITE_OTHER): Payer: BC Managed Care – PPO | Admitting: Internal Medicine

## 2023-05-20 ENCOUNTER — Encounter: Payer: Self-pay | Admitting: Internal Medicine

## 2023-05-20 VITALS — BP 136/76 | HR 76 | Ht 64.0 in | Wt 276.2 lb

## 2023-05-20 DIAGNOSIS — I10 Essential (primary) hypertension: Secondary | ICD-10-CM

## 2023-05-20 DIAGNOSIS — R7303 Prediabetes: Secondary | ICD-10-CM

## 2023-05-20 DIAGNOSIS — Z0001 Encounter for general adult medical examination with abnormal findings: Secondary | ICD-10-CM

## 2023-05-20 DIAGNOSIS — Z23 Encounter for immunization: Secondary | ICD-10-CM | POA: Diagnosis not present

## 2023-05-20 DIAGNOSIS — E782 Mixed hyperlipidemia: Secondary | ICD-10-CM | POA: Diagnosis not present

## 2023-05-20 DIAGNOSIS — M5136 Other intervertebral disc degeneration, lumbar region: Secondary | ICD-10-CM | POA: Diagnosis not present

## 2023-05-20 DIAGNOSIS — E559 Vitamin D deficiency, unspecified: Secondary | ICD-10-CM

## 2023-05-20 MED ORDER — CYCLOBENZAPRINE HCL 5 MG PO TABS
5.0000 mg | ORAL_TABLET | Freq: Two times a day (BID) | ORAL | 1 refills | Status: DC | PRN
Start: 2023-05-20 — End: 2023-06-25

## 2023-05-20 MED ORDER — ATORVASTATIN CALCIUM 20 MG PO TABS
20.0000 mg | ORAL_TABLET | Freq: Every day | ORAL | 1 refills | Status: DC
Start: 2023-05-20 — End: 2024-03-30

## 2023-05-20 MED ORDER — TELMISARTAN-HCTZ 40-12.5 MG PO TABS: 1.0000 | ORAL_TABLET | Freq: Every day | ORAL | 1 refills | Status: DC

## 2023-05-20 NOTE — Patient Instructions (Addendum)
You are being referred to Spine surgery and physical therapy for lumbar spine pain.  Please take Flexeril as needed for back muscle spasms.  Please continue to take medications as prescribed.  Please continue to follow low carb diet and perform moderate exercise/walking at least 150 mins/week.

## 2023-05-20 NOTE — Assessment & Plan Note (Addendum)
Noted on MRI of the lumbar spine - disc osteophyte complex causing nerve root displacement at L5 Referred to Spine surgery again Referred to PT Mobic as needed for pain for now, would avoid for long-term due to CKD Flexeril as needed for muscle spasms Avoid heavy lifting and frequent bending Work note provided to avoid heavy lifting

## 2023-05-20 NOTE — Assessment & Plan Note (Signed)
BP Readings from Last 1 Encounters:  05/20/23 136/76   Well-controlled with Telmisartan-hydrochlorothiazide and amlodipine 5 mg daily Counseled for compliance with the medications Advised DASH diet and moderate exercise/walking, at least 150 mins/week

## 2023-05-20 NOTE — Assessment & Plan Note (Signed)
On Lipitor 

## 2023-05-20 NOTE — Assessment & Plan Note (Signed)
Diet modification and moderate exercise advised Will discuss about Bariatric surgery option later. 

## 2023-05-20 NOTE — Assessment & Plan Note (Signed)
Annual exam as documented. Counseling done  re healthy lifestyle involving commitment to 150 minutes exercise per week, heart healthy diet, and attaining healthy weight.The importance of adequate sleep also discussed. Changes in health habits are decided on by the patient with goals and time frames  set for achieving them. Immunization and cancer screening needs are specifically addressed at this visit. 

## 2023-05-20 NOTE — Assessment & Plan Note (Signed)
Last vitamin D Lab Results  Component Value Date   VD25OH 14.5 (L) 03/21/2022   Has taken Vitamin D 50,000 IU qw

## 2023-05-20 NOTE — Progress Notes (Signed)
Established Patient Office Visit  Subjective:  Patient ID: Erica Greer, female    DOB: 07-20-1966  Age: 57 y.o. MRN: [id number]  CC:  Chief Complaint  Patient presents with   Annual Exam    HPI TWYLAH NUDING is a 57 y.o. female with past medical history of HTN, chronic low back pain, OA of left knee and morbid obesity who presents for annual physical.  BP is well-controlled. Takes medications regularly. Patient denies headache, dizziness, chest pain, dyspnea or palpitations.   DDD of lumbar spine: She has chronic low back pain, for which she had MRI of lumbar spine, which showed - Right foraminal/far lateral disc osteophyte complex at L5-S1 causing mild displacement of the exiting right L5 nerve root. Advanced facet degenerative changes at L3-4 and L4-5 resulting in trace anterolisthesis at these levels.  She recently went to care for acute low back pain, and was given muscle relaxer, which improved her pain.  Denies any recent heavy lifting or frequent bending.  She has stopped working at [employer] and has started working at [employer] center in the [employer].  She was previously referred to spine surgeon, but could not see them due to insurance coverage concern.        Past Medical History:  Diagnosis Date   Arthritis    [employer]reesia 01/16/2021   Chronic knee pain    HLD (hyperlipidemia)    HTN (hypertension)    Ovarian cyst    right    Subclinical hypothyroidism     Past Surgical History:  Procedure Laterality Date   COLONOSCOPY WITH PROPOFOL N/A 09/08/2021   Procedure: COLONOSCOPY WITH PROPOFOL;  Surgeon: Corbin Ade, MD;  [facility];  Service: Endoscopy;  Laterality: N/A;  12:45pm   LAPAROSCOPIC SALPINGO OOPHERECTOMY Right 10/04/2015   Procedure: LAPAROSCOPIC RIGHT SALPINGO OOPHORECTOMY;  Surgeon: Tilda Burrow, MD;  Location: [facility];  Service: Gynecology;  Laterality: Right;   LAPAROSCOPIC UNILATERAL SALPINGECTOMY Left  10/04/2015   Procedure: LAPAROSCOPIC LEFT SALPINGECTOMY;  Surgeon: Tilda Burrow, MD;  Location: [facility];  Service: Gynecology;  Laterality: Left;   OVARIAN CYST REMOVAL Left    POLYPECTOMY  09/08/2021   Procedure: POLYPECTOMY;  Surgeon: Corbin Ade, MD;  [facility];  Service: Endoscopy;;  cecal; hepatic flexure   TUBAL LIGATION Left    Unilateral    Family History  Problem Relation Age of Onset   Diabetes Mother    Cancer Father    Seizures Sister    Colon cancer Neg Hx    Colon polyps Neg Hx     Social History   Socioeconomic History   Marital status: Legally Separated    Spouse name: Not on file   Number of children: Not on file   Years of education: Not on file   Highest education level: Not on file  Occupational History   Not on file  Tobacco Use   Smoking status: Never   Smokeless tobacco: Never  Substance and Sexual Activity   Alcohol use: No   Drug use: No   Sexual activity: Yes    Birth control/protection: Surgical  Other Topics Concern   Not on file  Social History Narrative   Not on file   Social Determinants of Health   Financial Resource Strain: Not on file  Food Insecurity: Not on file  Transportation Needs: Not on file  Physical Activity: Not on file  Stress: Not on file  Social Connections: Not  on file  Intimate Partner Violence: Not on file    Outpatient Medications Prior to Visit  Medication Sig Dispense Refill   amLODipine (NORVASC) 5 MG tablet Take 1 tablet (5 mg total) by mouth daily. 90 tablet 1   docusate sodium (COLACE) 100 MG capsule Take 1 capsule (100 mg total) by mouth daily as needed for mild constipation. 30 capsule 0   hydrOXYzine (VISTARIL) 25 MG capsule Take 1 capsule (25 mg total) by mouth at bedtime as needed (Insomnia). 30 capsule 2   meloxicam (MOBIC) 7.5 MG tablet TAKE 1 TABLET BY MOUTH EVERY DAY 30 tablet 2   montelukast (SINGULAIR) 10 MG tablet Take 1 tablet (10 mg total) by mouth at bedtime. 30 tablet  1   atorvastatin (LIPITOR) 20 MG tablet Take 1 tablet (20 mg total) by mouth at bedtime. 90 tablet 1   chlorpheniramine (CHLOR-TRIMETON) 4 MG tablet Take one tablet by mouth once daily for 3 days, then as needed, for excessive nasal congestion and drainage 14 tablet 0   methocarbamol (ROBAXIN) 500 MG tablet Take 1-2 tablets (500-1,000 mg total) by mouth every 6 (six) hours as needed for muscle spasms. 100 tablet 1   predniSONE (STERAPRED UNI-PAK 21 TAB) 10 MG (21) TBPK tablet Take as package instructions. 1 each 0   telmisartan-hydrochlorothiazide (MICARDIS HCT) 40-12.5 MG tablet Take 1 tablet by mouth daily. 30 tablet 5   No facility-administered medications prior to visit.    Allergies  Allergen Reactions   Hydrocodone Nausea And Vomiting    ROS Review of Systems  Constitutional:  Negative for chills and fever.  HENT:  Negative for congestion, sinus pressure, sinus pain and sore throat.   Respiratory:  Negative for cough and shortness of breath.   Cardiovascular:  Negative for chest pain and palpitations.  Gastrointestinal:  Positive for constipation. Negative for nausea and vomiting.  Genitourinary:  Negative for dysuria and hematuria.  Musculoskeletal:  Positive for back pain. Negative for neck pain and neck stiffness.  Skin:  Negative for rash.  Neurological:  Negative for dizziness and weakness.  Psychiatric/Behavioral:  Positive for sleep disturbance. Negative for agitation and behavioral problems.       Objective:    Physical Exam Vitals reviewed.  Constitutional:      General: She is not in acute distress.    Appearance: She is obese. She is not diaphoretic.  HENT:     Head: Normocephalic and atraumatic.     Nose: Nose normal.     Mouth/Throat:     Mouth: Mucous membranes are moist.  Eyes:     General: No scleral icterus.    Extraocular Movements: Extraocular movements intact.  Cardiovascular:     Rate and Rhythm: Normal rate and regular rhythm.     Pulses:  Normal pulses.     Heart sounds: Normal heart sounds. No murmur heard. Pulmonary:     Breath sounds: Normal breath sounds. No wheezing or rales.  Abdominal:     Palpations: Abdomen is soft.     Tenderness: There is no abdominal tenderness.  Musculoskeletal:        General: Tenderness (Lumbar spine area) present.     Cervical back: Neck supple. No tenderness.     Right lower leg: No edema.     Left lower leg: No edema.  Skin:    General: Skin is warm.     Findings: No rash.  Neurological:     General: No focal deficit present.     Mental  Status: She is alert and oriented to person, place, and time.     Cranial Nerves: No cranial nerve deficit.     Sensory: No sensory deficit.     Motor: No weakness.  Psychiatric:        Mood and Affect: Mood normal.        Behavior: Behavior normal.     BP 136/76 (BP Location: Left Arm, Patient Position: Sitting, Cuff Size: Large)   Pulse 76   Ht 5\' 4"  (1.626 m)   Wt 276 lb 3.2 oz (125.3 kg)   LMP 02/11/2015   SpO2 99%   BMI 47.41 kg/m  Wt Readings from Last 3 Encounters:  05/20/23 276 lb 3.2 oz (125.3 kg)  01/30/23 253 lb 12.8 oz (115.1 kg)  12/21/22 253 lb 2 oz (114.8 kg)    Lab Results  Component Value Date   TSH 2.380 03/21/2022   Lab Results  Component Value Date   WBC 10.8 03/21/2022   HGB 10.6 (L) 03/21/2022   HCT 32.5 (L) 03/21/2022   MCV 77 (L) 03/21/2022   PLT 205 03/21/2022   Lab Results  Component Value Date   NA 144 03/21/2022   K 4.7 03/21/2022   CO2 24 03/21/2022   GLUCOSE 91 03/21/2022   BUN 14 03/21/2022   CREATININE 1.05 (H) 03/21/2022   BILITOT <0.2 03/21/2022   ALKPHOS 59 03/21/2022   AST 14 03/21/2022   ALT 11 03/21/2022   PROT 6.6 03/21/2022   ALBUMIN 4.1 03/21/2022   CALCIUM 9.3 03/21/2022   ANIONGAP 4 (L) 12/28/2020   EGFR 62 03/21/2022   Lab Results  Component Value Date   CHOL 169 03/21/2022   Lab Results  Component Value Date   HDL 53 03/21/2022   Lab Results  Component Value  Date   LDLCALC 90 03/21/2022   Lab Results  Component Value Date   TRIG 150 (H) 03/21/2022   Lab Results  Component Value Date   CHOLHDL 3.2 03/21/2022   Lab Results  Component Value Date   HGBA1C 5.5 03/21/2022      Assessment & Plan:   Problem List Items Addressed This Visit       Cardiovascular and Mediastinum   Primary hypertension    BP Readings from Last 1 Encounters:  05/20/23 136/76  Well-controlled with Telmisartan-hydrochlorothiazide and amlodipine 5 mg daily Counseled for compliance with the medications Advised DASH diet and moderate exercise/walking, at least 150 mins/week      Relevant Medications   telmisartan-hydrochlorothiazide (MICARDIS HCT) 40-12.5 MG tablet   atorvastatin (LIPITOR) 20 MG tablet   Other Relevant Orders   TSH   CBC with Differential/Platelet     Musculoskeletal and Integument   DDD (degenerative disc disease), lumbar    Noted on MRI of the lumbar spine - disc osteophyte complex causing nerve root displacement at L5 Referred to Spine surgery again Referred to PT Mobic as needed for pain for now, would avoid for long-term due to CKD Flexeril as needed for muscle spasms Avoid heavy lifting and frequent bending Work note provided to avoid heavy lifting      Relevant Medications   cyclobenzaprine (FLEXERIL) 5 MG tablet   Other Relevant Orders   Ambulatory referral to Spine Surgery   Ambulatory referral to Physical Therapy     Other   Morbid obesity ([facility])    Diet modification and moderate exercise advised Will discuss about Bariatric surgery option later      Encounter for general adult medical  examination with abnormal findings - Primary    Annual exam as documented. Counseling done  re healthy lifestyle involving commitment to 150 minutes exercise per week, heart healthy diet, and attaining healthy weight.The importance of adequate sleep also discussed. Changes in health habits are decided on by the patient with goals and  time frames  set for achieving them. Immunization and cancer screening needs are specifically addressed at this visit.      Vitamin D deficiency    Last vitamin D Lab Results  Component Value Date   VD25OH 14.5 (L) 03/21/2022  Has taken Vitamin D 50,000 IU qw      Relevant Orders   VITAMIN D 25 Hydroxy (Vit-D Deficiency, Fractures)   Mixed hyperlipidemia    On Lipitor      Relevant Medications   telmisartan-hydrochlorothiazide (MICARDIS HCT) 40-12.5 MG tablet   atorvastatin (LIPITOR) 20 MG tablet   Other Relevant Orders   Lipid panel   Other Visit Diagnoses     Prediabetes       Relevant Orders   Hemoglobin A1c   CMP14+EGFR   Encounter for immunization       Relevant Orders   Tdap vaccine greater than or equal to 7yo IM (Completed)       Meds ordered this encounter  Medications   cyclobenzaprine (FLEXERIL) 5 MG tablet    Sig: Take 1 tablet (5 mg total) by mouth 2 (two) times daily as needed for muscle spasms.    Dispense:  30 tablet    Refill:  1   telmisartan-hydrochlorothiazide (MICARDIS HCT) 40-12.5 MG tablet    Sig: Take 1 tablet by mouth daily.    Dispense:  90 tablet    Refill:  1   atorvastatin (LIPITOR) 20 MG tablet    Sig: Take 1 tablet (20 mg total) by mouth at bedtime.    Dispense:  90 tablet    Refill:  1    Follow-up: Return in about 6 months (around 11/20/2023) for HTN and back pain.    Anabel Halon, MD

## 2023-05-21 LAB — CBC WITH DIFFERENTIAL/PLATELET
Basophils Absolute: 0.1 10*3/uL (ref 0.0–0.2)
EOS (ABSOLUTE): 0.4 10*3/uL (ref 0.0–0.4)
Eos: 3 %
Immature Grans (Abs): 0 10*3/uL (ref 0.0–0.1)
Immature Granulocytes: 0 %
MCH: 24.2 pg — ABNORMAL LOW (ref 26.6–33.0)
Monocytes Absolute: 1 10*3/uL — ABNORMAL HIGH (ref 0.1–0.9)
Monocytes: 9 %
Neutrophils Absolute: 6.8 10*3/uL (ref 1.4–7.0)
RBC: 4.58 x10E6/uL (ref 3.77–5.28)
RDW: 12.9 % (ref 11.7–15.4)

## 2023-05-21 LAB — CMP14+EGFR
AST: 16 IU/L (ref 0–40)
Alkaline Phosphatase: 74 IU/L (ref 44–121)
BUN/Creatinine Ratio: 13 (ref 9–23)
BUN: 16 mg/dL (ref 6–24)
Bilirubin Total: <0.2 mg/dL (ref 0.0–1.2)
CO2: 25 mmol/L (ref 20–29)
Sodium: 144 mmol/L (ref 134–144)

## 2023-05-21 LAB — LIPID PANEL
Chol/HDL Ratio: 2.8 ratio (ref 0.0–4.4)
Cholesterol, Total: 135 mg/dL (ref 100–199)
HDL: 48 mg/dL (ref >39)
LDL Chol Calc (NIH): 67 mg/dL (ref 0–99)
Triglycerides: 113 mg/dL (ref 0–149)

## 2023-05-21 LAB — VITAMIN D 25 HYDROXY (VIT D DEFICIENCY, FRACTURES): Vit D, 25-Hydroxy: 18.8 ng/mL — ABNORMAL LOW (ref 30.0–100.0)

## 2023-05-21 LAB — HEMOGLOBIN A1C

## 2023-05-22 LAB — CMP14+EGFR
ALT: 32 IU/L (ref 0–32)
Albumin: 4.3 g/dL (ref 3.8–4.9)
Calcium: 9.4 mg/dL (ref 8.7–10.2)
Chloride: 106 mmol/L (ref 96–106)
Creatinine, Ser: 1.24 mg/dL — ABNORMAL HIGH (ref 0.57–1.00)
Globulin, Total: 2.5 g/dL (ref 1.5–4.5)
Glucose: 80 mg/dL (ref 70–99)
Potassium: 5.4 mmol/L — ABNORMAL HIGH (ref 3.5–5.2)
Total Protein: 6.8 g/dL (ref 6.0–8.5)
eGFR: 50 mL/min/{1.73_m2} — ABNORMAL LOW (ref >59)

## 2023-05-22 LAB — LIPID PANEL: VLDL Cholesterol Cal: 20 mg/dL (ref 5–40)

## 2023-05-22 LAB — CBC WITH DIFFERENTIAL/PLATELET
Basos: 1 %
Hematocrit: 35.9 % (ref 34.0–46.6)
Hemoglobin: 11.1 g/dL (ref 11.1–15.9)
Lymphocytes Absolute: 3.2 10*3/uL — ABNORMAL HIGH (ref 0.7–3.1)
Lymphs: 28 %
MCHC: 30.9 g/dL — ABNORMAL LOW (ref 31.5–35.7)
MCV: 78 fL — ABNORMAL LOW (ref 79–97)
Neutrophils: 59 %
Platelets: 216 10*3/uL (ref 150–450)
WBC: 11.4 10*3/uL — ABNORMAL HIGH (ref 3.4–10.8)

## 2023-05-22 LAB — TSH: TSH: 2.86 u[IU]/mL (ref 0.450–4.500)

## 2023-05-22 LAB — HEMOGLOBIN A1C: Est. average glucose Bld gHb Est-mCnc: 111 mg/dL

## 2023-06-25 ENCOUNTER — Other Ambulatory Visit: Payer: Self-pay | Admitting: Internal Medicine

## 2023-06-25 DIAGNOSIS — M5136 Other intervertebral disc degeneration, lumbar region: Secondary | ICD-10-CM

## 2023-07-25 ENCOUNTER — Other Ambulatory Visit: Payer: Self-pay | Admitting: Internal Medicine

## 2023-07-25 DIAGNOSIS — M5136 Other intervertebral disc degeneration, lumbar region: Secondary | ICD-10-CM

## 2023-07-25 DIAGNOSIS — I10 Essential (primary) hypertension: Secondary | ICD-10-CM

## 2023-08-07 ENCOUNTER — Other Ambulatory Visit: Payer: Self-pay | Admitting: Internal Medicine

## 2023-08-07 DIAGNOSIS — M5136 Other intervertebral disc degeneration, lumbar region: Secondary | ICD-10-CM

## 2023-09-02 ENCOUNTER — Encounter: Payer: Self-pay | Admitting: Internal Medicine

## 2023-09-02 ENCOUNTER — Other Ambulatory Visit: Payer: Self-pay | Admitting: Internal Medicine

## 2023-09-02 DIAGNOSIS — M51369 Other intervertebral disc degeneration, lumbar region without mention of lumbar back pain or lower extremity pain: Secondary | ICD-10-CM

## 2023-09-02 MED ORDER — CYCLOBENZAPRINE HCL 10 MG PO TABS
10.0000 mg | ORAL_TABLET | Freq: Every evening | ORAL | 1 refills | Status: DC | PRN
Start: 2023-09-02 — End: 2023-10-02

## 2023-09-04 ENCOUNTER — Emergency Department (HOSPITAL_COMMUNITY): Payer: BC Managed Care – PPO

## 2023-09-04 ENCOUNTER — Emergency Department (HOSPITAL_COMMUNITY)
Admission: EM | Admit: 2023-09-04 | Discharge: 2023-09-04 | Disposition: A | Discharge status: Home / Self Care | Payer: BC Managed Care – PPO | Attending: Emergency Medicine | Admitting: Emergency Medicine

## 2023-09-04 ENCOUNTER — Encounter (HOSPITAL_COMMUNITY): Payer: Self-pay | Admitting: Radiology

## 2023-09-04 ENCOUNTER — Other Ambulatory Visit: Payer: Self-pay

## 2023-09-04 DIAGNOSIS — N189 Chronic kidney disease, unspecified: Secondary | ICD-10-CM | POA: Diagnosis not present

## 2023-09-04 DIAGNOSIS — M545 Low back pain, unspecified: Secondary | ICD-10-CM | POA: Diagnosis present

## 2023-09-04 LAB — I-STAT CHEM 8, ED
BUN: 22 mg/dL — ABNORMAL HIGH (ref 6–20)
Calcium, Ion: 1.23 mmol/L (ref 1.15–1.40)
Chloride: 108 mmol/L (ref 98–111)
Creatinine, Ser: 1.6 mg/dL — ABNORMAL HIGH (ref 0.44–1.00)
Glucose, Bld: 103 mg/dL — ABNORMAL HIGH (ref 70–99)
HCT: 33 % — ABNORMAL LOW (ref 36.0–46.0)
Hemoglobin: 11.2 g/dL — ABNORMAL LOW (ref 12.0–15.0)
Potassium: 4.2 mmol/L (ref 3.5–5.1)
Sodium: 143 mmol/L (ref 135–145)
TCO2: 26 mmol/L (ref 22–32)

## 2023-09-04 MED ORDER — LIDOCAINE 5 % EX PTCH
1.0000 | MEDICATED_PATCH | CUTANEOUS | Status: DC
  Administered 2023-09-04: 1 via TRANSDERMAL
  Filled 2023-09-04: qty 1

## 2023-09-04 MED ORDER — OXYCODONE-ACETAMINOPHEN 5-325 MG PO TABS
1.0000 | ORAL_TABLET | ORAL | 0 refills | Status: DC | PRN
Start: 2023-09-04 — End: 2023-09-12

## 2023-09-04 MED ORDER — KETOROLAC TROMETHAMINE 30 MG/ML IJ SOLN: 60.0000 mg | Freq: Once | INTRAMUSCULAR | Status: DC

## 2023-09-04 MED ORDER — OXYCODONE-ACETAMINOPHEN 5-325 MG PO TABS
1.0000 | ORAL_TABLET | Freq: Four times a day (QID) | ORAL | 0 refills | Status: DC | PRN
Start: 2023-09-04 — End: 2023-09-12

## 2023-09-04 NOTE — Discharge Instructions (Signed)
You have been prescribed medication for your back pain.  Avoid twisting bending or heavy lifting for at least 1 week.  Also as discussed apply over-the-counter lidocaine patches.  They are 4% and you can buy these without prescription.  Use as directed.  Please follow-up with your primary care provider for recheck if needed.  Return to emergency department for any new or worsening symptoms.

## 2023-09-04 NOTE — ED Provider Notes (Signed)
[place] EMERGENCY DEPARTMENT AT [facility] Provider Note   CSN: [id number] Arrival date & time: 09/04/23  1624     History  No chief complaint on file.   Erica Greer is a 57 y.o. female.  HPI      Erica Greer is a 57 y.o. female past medical history of chronic kidney disease, chronic knee pain, arthritis, hypertension, who presents to the Emergency Department complaining of low back pain.  Pain has been present for the last week.  Had similar symptoms several years ago but spontaneously resolved.  She takes muscle relaxers.  PCP recently increased her Flexeril from 5 mg to 10 mg patient states no improvement with this.  She denies any urine or bowel changes, numbness or weakness of her lower extremities.  No radiating pain.  Pain is worse with movement and appears at rest.  No abdominal pain or saddle anesthesias.   Home Medications Prior to Admission medications   Medication Sig Start Date End Date Taking? Authorizing Provider  amLODipine (NORVASC) 5 MG tablet TAKE 1 TABLET (5 MG TOTAL) BY MOUTH DAILY. 07/25/23   Anabel Halon, MD  atorvastatin (LIPITOR) 20 MG tablet Take 1 tablet (20 mg total) by mouth at bedtime. 05/20/23   Anabel Halon, MD  cyclobenzaprine (FLEXERIL) 10 MG tablet Take 1 tablet (10 mg total) by mouth at bedtime as needed for muscle spasms. 09/02/23   Anabel Halon, MD  docusate sodium (COLACE) 100 MG capsule Take 1 capsule (100 mg total) by mouth daily as needed for mild constipation. 07/31/21   Anabel Halon, MD  hydrOXYzine (VISTARIL) 25 MG capsule Take 1 capsule (25 mg total) by mouth at bedtime as needed (Insomnia). 11/17/21   Anabel Halon, MD  meloxicam (MOBIC) 7.5 MG tablet TAKE 1 TABLET BY MOUTH EVERY DAY 07/25/23   Anabel Halon, MD  montelukast (SINGULAIR) 10 MG tablet Take 1 tablet (10 mg total) by mouth at bedtime. 04/26/22   Kerri Perches, MD  telmisartan-hydrochlorothiazide (MICARDIS HCT) 40-12.5 MG tablet Take 1  tablet by mouth daily. 05/20/23   Anabel Halon, MD      Allergies    Hydrocodone    Review of Systems   Review of Systems  Constitutional:  Negative for fever.  Respiratory:  Negative for shortness of breath.   Cardiovascular:  Negative for chest pain.  Gastrointestinal:  Negative for abdominal pain, diarrhea, nausea and vomiting.  Genitourinary:  Negative for decreased urine volume, difficulty urinating, dysuria and flank pain.  Musculoskeletal:  Positive for back pain. Negative for neck pain.  Skin:  Negative for color change.  Neurological:  Negative for dizziness, weakness, numbness and headaches.    Physical Exam Updated Vital Signs BP (!) 109/53   Pulse 82   Temp 98.5 F (36.9 C) (Oral)   Resp 20   Ht 5\' 3"  (1.6 m)   Wt 117.9 kg   LMP 02/11/2015   SpO2 100%   BMI 46.06 kg/m  Physical Exam Vitals and nursing note reviewed.  Constitutional:      General: She is not in acute distress.    Appearance: Normal appearance. She is not ill-appearing or toxic-appearing.  HENT:     Mouth/Throat:     Mouth: Mucous membranes are moist.  Cardiovascular:     Rate and Rhythm: Normal rate and regular rhythm.     Pulses: Normal pulses.  Pulmonary:     Effort: Pulmonary effort is normal.  Chest:  Chest wall: No tenderness.  Abdominal:     Palpations: Abdomen is soft.     Tenderness: There is no abdominal tenderness.  Musculoskeletal:        General: Tenderness present.     Cervical back: Normal range of motion.     Lumbar back: Tenderness present. No swelling. Normal range of motion. Negative right straight leg raise test and negative left straight leg raise test.     Comments: Tender to palpation left thoracic and upper lumbar paraspinal muscles.  Mild midline tenderness to the upper lumbar spine.  No bony step-offs.  Negative straight leg raise bilaterally.  Skin:    General: Skin is warm.     Capillary Refill: Capillary refill takes less than 2 seconds.   Neurological:     General: No focal deficit present.     Mental Status: She is alert.     Sensory: No sensory deficit.     Motor: No weakness.     ED Results / Procedures / Treatments   Labs (all labs ordered are listed, but only abnormal results are displayed) Labs Reviewed - No data to display  EKG None  Radiology DG Lumbar Spine Complete  Result Date: 09/04/2023 CLINICAL DATA:  Back pain EXAM: LUMBAR SPINE - COMPLETE 4+ VIEW COMPARISON:  Lumbar spine x-ray 02/13/2021 FINDINGS: There is no evidence of lumbar spine fracture. Alignment is normal. Intervertebral disc spaces are maintained. There are mild degenerative changes of lower lumbar facet joints. IMPRESSION: Mild degenerative changes of lower lumbar facet joints. Electronically Signed   By: Darliss Cheney M.D.   On: 09/04/2023 19:37    Procedures Procedures    Medications Ordered in ED Medications  ketorolac (TORADOL) 30 MG/ML injection 60 mg (has no administration in time range)  lidocaine (LIDODERM) 5 % 1 patch (has no administration in time range)    ED Course/ Medical Decision Making/ A&P                                 Medical Decision Making Patient here with low back pain.  No known injury.  Chronically takes muscle relaxer with dose recently increased.  No improvement of her symptoms.  Pain reproducible with movement and palpation, improves at rest.  Exam without red flags for cauda equina.  No saddle anesthesias.  Ambulatory in the department neurovascularly intact.  I suspect musculoskeletal injury cauda equina, UTI also considered.  Amount and/or Complexity of Data Reviewed Labs: ordered.    Details: Labs show serum creatinine of 1.6 this is mild increase of her serum creatinine from 3 months ago.  No evidence of AKI. Radiology: ordered.    Details: X-ray of the lumbar spine with mild degenerative changes Discussion of management or test interpretation with external provider(s): Patient ambulatory in  the department.  Suspect musculoskeletal pain.  Will avoid NSAIDs due to chronic kidney disease.  Will provide short course of pain medication, recommended over-the-counter lidocaine patches as well for symptomatic relief patient will follow-up closely with PCP for close monitoring of progression of kidney disease.  Risk Prescription drug management.           Final Clinical Impression(s) / ED Diagnoses Final diagnoses:  Acute midline low back pain without sciatica    Rx / DC Orders ED Discharge Orders     None         Pauline Aus, PA-C 09/05/23 1211    Franne Forts,  DO 09/12/23 [id number]

## 2023-09-04 NOTE — ED Triage Notes (Signed)
Pt states her right lower back has been hurting for the past week. Pt states she has had this pain several years ago but it went away. Her provider called in a prescription for flexeril and has been taking 10mg  but it isnt helping. Pt denies any injury.

## 2023-09-05 MED FILL — Oxycodone w/ Acetaminophen Tab 5-325 MG: ORAL | Qty: 6 | Status: AC

## 2023-09-12 ENCOUNTER — Encounter: Payer: Self-pay | Admitting: Internal Medicine

## 2023-09-12 ENCOUNTER — Ambulatory Visit: Payer: BC Managed Care – PPO | Admitting: Internal Medicine

## 2023-09-12 VITALS — BP 135/74 | HR 89 | Ht 63.0 in | Wt 282.4 lb

## 2023-09-12 DIAGNOSIS — N1831 Chronic kidney disease, stage 3a: Secondary | ICD-10-CM | POA: Diagnosis not present

## 2023-09-12 DIAGNOSIS — N179 Acute kidney failure, unspecified: Secondary | ICD-10-CM | POA: Insufficient documentation

## 2023-09-12 DIAGNOSIS — M5136 Other intervertebral disc degeneration, lumbar region with discogenic back pain only: Secondary | ICD-10-CM

## 2023-09-12 DIAGNOSIS — Z09 Encounter for follow-up examination after completed treatment for conditions other than malignant neoplasm: Secondary | ICD-10-CM | POA: Diagnosis not present

## 2023-09-12 NOTE — Assessment & Plan Note (Signed)
ER chart reviewed, including imaging Had acute on chronic low back pain and AKI Back pain improved now, but has chronic low back pain-work note provided

## 2023-09-12 NOTE — Assessment & Plan Note (Addendum)
Last BMP reviewed - had AKI on CKD Advised to maintain adequate hydration Avoid nephrotoxic agents On ARB Check BMP and urine protein creatinine ratio

## 2023-09-12 NOTE — Patient Instructions (Signed)
Please take Flexeril as needed for back muscle spasms.  Avoid heavy lifting and frequent bending.

## 2023-09-12 NOTE — Progress Notes (Signed)
Acute Office Visit  Subjective:    Patient ID: Erica Greer, female    DOB: 11-20-1965, 57 y.o.   MRN: [id number]  Chief Complaint  Patient presents with   Flank Pain    Kidney/ back pain    Ear Fullness    Ear full, can't hear     HPI Patient is in today for follow-up of recent ER visit for c/o back pain.  She had an x-ray of lumbar spine, which showed mild degenerative changes of lower lumbar facet joints.  She was given Toradol injection, and later was discharged with oral oxycodone.  She had drowsiness with oxycodone.  Her back pain is better now compared to prior.  DDD of lumbar spine: She has chronic low back pain, for which she had MRI of lumbar spine in 05/23, which showed - Right foraminal/far lateral disc osteophyte complex at L5-S1 causing mild displacement of the exiting right L5 nerve root. Advanced facet degenerative changes at L3-4 and L4-5 resulting in trace anterolisthesis at these levels.  She recently went to ER for acute low back pain. She has been given muscle relaxer, which improved her pain initially in the past.  Denies any recent heavy lifting or frequent bending.  She has stopped working at [employer] and has started working at [employer] center in the [employer].  She was previously referred to spine surgeon, but could not see them due to insurance coverage concern.  CKD: Her serum creatinine was elevated at 1.60 during ER visit. Her S. Cr. Was 1.24 in 07/24.  She has been advised to increase fluid intake in the past.  She also reports bilateral ear fullness.  Denies any nasal congestion, ear pain or discharge currently.  Past Medical History:  Diagnosis Date   Arthritis    [employer] 01/16/2021   Chronic knee pain    HLD (hyperlipidemia)    HTN (hypertension)    Ovarian cyst    right    Subclinical hypothyroidism     Past Surgical History:  Procedure Laterality Date   COLONOSCOPY WITH PROPOFOL N/A 09/08/2021   Procedure:  COLONOSCOPY WITH PROPOFOL;  Surgeon: Corbin Ade, MD;  Location: [facility];  Service: Endoscopy;  Laterality: N/A;  12:45pm   LAPAROSCOPIC SALPINGO OOPHERECTOMY Right 10/04/2015   Procedure: LAPAROSCOPIC RIGHT SALPINGO OOPHORECTOMY;  Surgeon: Tilda Burrow, MD;  Location: [facility];  Service: Gynecology;  Laterality: Right;   LAPAROSCOPIC UNILATERAL SALPINGECTOMY Left 10/04/2015   Procedure: LAPAROSCOPIC LEFT SALPINGECTOMY;  Surgeon: Tilda Burrow, MD;  Location: [facility];  Service: Gynecology;  Laterality: Left;   OVARIAN CYST REMOVAL Left    POLYPECTOMY  09/08/2021   Procedure: POLYPECTOMY;  Surgeon: Corbin Ade, MD;  Location: [facility];  Service: Endoscopy;;  cecal; hepatic flexure   TUBAL LIGATION Left    Unilateral    Family History  Problem Relation Age of Onset   Diabetes Mother    Cancer Father    Seizures Sister    Colon cancer Neg Hx    Colon polyps Neg Hx     Social History   Socioeconomic History   Marital status: Legally Separated    Spouse name: Not on file   Number of children: Not on file   Years of education: Not on file   Highest education level: Not on file  Occupational History   Not on file  Tobacco Use   Smoking status: Never   Smokeless tobacco: Never  Vaping Use  Vaping status: Never Used  Substance and Sexual Activity   Alcohol use: No   Drug use: No   Sexual activity: Yes    Birth control/protection: Surgical  Other Topics Concern   Not on file  Social History Narrative   Not on file   Social Determinants of Health   Financial Resource Strain: Not on file  Food Insecurity: Not on file  Transportation Needs: Not on file  Physical Activity: Not on file  Stress: Not on file  Social Connections: Not on file  Intimate Partner Violence: Not on file    Outpatient Medications Prior to Visit  Medication Sig Dispense Refill   amLODipine (NORVASC) 5 MG tablet TAKE 1 TABLET (5 MG TOTAL) BY MOUTH DAILY. 90 tablet 1    atorvastatin (LIPITOR) 20 MG tablet Take 1 tablet (20 mg total) by mouth at bedtime. 90 tablet 1   cyclobenzaprine (FLEXERIL) 10 MG tablet Take 1 tablet (10 mg total) by mouth at bedtime as needed for muscle spasms. 30 tablet 1   docusate sodium (COLACE) 100 MG capsule Take 1 capsule (100 mg total) by mouth daily as needed for mild constipation. 30 capsule 0   hydrOXYzine (VISTARIL) 25 MG capsule Take 1 capsule (25 mg total) by mouth at bedtime as needed (Insomnia). 30 capsule 2   meloxicam (MOBIC) 7.5 MG tablet TAKE 1 TABLET BY MOUTH EVERY DAY 30 tablet 2   montelukast (SINGULAIR) 10 MG tablet Take 1 tablet (10 mg total) by mouth at bedtime. 30 tablet 1   telmisartan-hydrochlorothiazide (MICARDIS HCT) 40-12.5 MG tablet Take 1 tablet by mouth daily. 90 tablet 1   oxyCODONE-acetaminophen (PERCOCET/ROXICET) 5-325 MG tablet Take 1 tablet by mouth every 6 (six) hours as needed for severe pain (pain score 7-10). 6 tablet 0   oxyCODONE-acetaminophen (PERCOCET/ROXICET) 5-325 MG tablet Take 1 tablet by mouth every 4 (four) hours as needed. 6 tablet 0   No facility-administered medications prior to visit.    Allergies  Allergen Reactions   Hydrocodone Nausea And Vomiting    Review of Systems  Constitutional:  Negative for chills and fever.  HENT:  Negative for congestion, sinus pressure, sinus pain and sore throat.        Bilateral ear fullness  Respiratory:  Negative for cough and shortness of breath.   Cardiovascular:  Negative for chest pain and palpitations.  Gastrointestinal:  Positive for constipation. Negative for nausea and vomiting.  Genitourinary:  Negative for dysuria and hematuria.  Musculoskeletal:  Positive for back pain. Negative for neck pain and neck stiffness.  Skin:  Negative for rash.  Neurological:  Negative for dizziness and weakness.  Psychiatric/Behavioral:  Positive for sleep disturbance. Negative for agitation and behavioral problems.        Objective:    Physical  Exam Vitals reviewed.  Constitutional:      General: She is not in acute distress.    Appearance: She is obese. She is not diaphoretic.  HENT:     Head: Normocephalic and atraumatic.     Right Ear: There is impacted cerumen.     Left Ear: There is impacted cerumen.     Nose: Nose normal.     Mouth/Throat:     Mouth: Mucous membranes are moist.  Eyes:     General: No scleral icterus.    Extraocular Movements: Extraocular movements intact.  Cardiovascular:     Rate and Rhythm: Normal rate and regular rhythm.     Pulses: Normal pulses.     Heart  sounds: Normal heart sounds. No murmur heard. Pulmonary:     Breath sounds: Normal breath sounds. No wheezing or rales.  Abdominal:     Palpations: Abdomen is soft.     Tenderness: There is no abdominal tenderness.  Musculoskeletal:        General: Tenderness (Lumbar spine area) present.     Cervical back: Neck supple. No tenderness.     Right lower leg: No edema.     Left lower leg: No edema.  Skin:    General: Skin is warm.     Findings: No rash.  Neurological:     General: No focal deficit present.     Mental Status: She is alert and oriented to person, place, and time.     Cranial Nerves: No cranial nerve deficit.     Sensory: No sensory deficit.     Motor: No weakness.  Psychiatric:        Mood and Affect: Mood normal.        Behavior: Behavior normal.     BP 135/74 (BP Location: Right Arm, Patient Position: Sitting, Cuff Size: Large)   Pulse 89   Ht 5\' 3"  (1.6 m)   Wt 282 lb 6.4 oz (128.1 kg)   LMP 02/11/2015   SpO2 95%   BMI 50.02 kg/m  Wt Readings from Last 3 Encounters:  09/12/23 282 lb 6.4 oz (128.1 kg)  09/04/23 260 lb (117.9 kg)  05/20/23 276 lb 3.2 oz (125.3 kg)        Assessment & Plan:   Problem List Items Addressed This Visit       Musculoskeletal and Integument   DDD (degenerative disc disease), lumbar    Noted on MRI of the lumbar spine - disc osteophyte complex causing nerve root displacement  at L5 Referred to Spine surgery again Referred to PT Mobic as needed for pain for now, would avoid for long-term due to CKD Flexeril as needed for muscle spasms Avoid heavy lifting and frequent bending Work note provided to avoid heavy lifting      Relevant Orders   Ambulatory referral to Spine Surgery     Genitourinary   Stage 3a chronic kidney disease (HCC)    Last BMP reviewed - had AKI on CKD Advised to maintain adequate hydration Avoid nephrotoxic agents On ARB Check BMP and urine protein creatinine ratio      Relevant Orders   Protein / creatinine ratio, urine   AKI (acute kidney injury) (HCC)    Her serum creatinine was elevated recently Likely due to dehydration Advised to maintain adequate hydration Recheck BMP      Relevant Orders   Basic Metabolic Panel (BMET)     Other   Encounter for examination following treatment at hospital - Primary    ER chart reviewed, including imaging Had acute on chronic low back pain and AKI Back pain improved now, but has chronic low back pain-work note provided         No orders of the defined types were placed in this encounter.    Anabel Halon, MD

## 2023-09-12 NOTE — Assessment & Plan Note (Deleted)
Acute on chronic low back pain Previous x-ray of lumbar spine showed DDD of lumbar spine Atraumatic chronic low back pain Flexeril as needed for muscle spasms Avoid heavy lifting and frequent bending We will check MRI of lumbar spine

## 2023-09-12 NOTE — Assessment & Plan Note (Signed)
Her serum creatinine was elevated recently Likely due to dehydration Advised to maintain adequate hydration Recheck BMP

## 2023-09-12 NOTE — Assessment & Plan Note (Signed)
Noted on MRI of the lumbar spine - disc osteophyte complex causing nerve root displacement at L5 Referred to Spine surgery again Referred to PT Mobic as needed for pain for now, would avoid for long-term due to CKD Flexeril as needed for muscle spasms Avoid heavy lifting and frequent bending Work note provided to avoid heavy lifting

## 2023-09-13 ENCOUNTER — Encounter: Payer: Self-pay | Admitting: Internal Medicine

## 2023-09-13 ENCOUNTER — Other Ambulatory Visit: Payer: Self-pay | Admitting: Internal Medicine

## 2023-09-13 DIAGNOSIS — N179 Acute kidney failure, unspecified: Secondary | ICD-10-CM

## 2023-09-14 LAB — BASIC METABOLIC PANEL
BUN/Creatinine Ratio: 15 (ref 9–23)
BUN: 23 mg/dL (ref 6–24)
CO2: 24 mmol/L (ref 20–29)
Calcium: 10.2 mg/dL (ref 8.7–10.2)
Chloride: 104 mmol/L (ref 96–106)
Creatinine, Ser: 1.58 mg/dL — ABNORMAL HIGH (ref 0.57–1.00)
Glucose: 100 mg/dL — ABNORMAL HIGH (ref 70–99)
Potassium: 5.1 mmol/L (ref 3.5–5.2)
Sodium: 141 mmol/L (ref 134–144)
eGFR: 38 mL/min/{1.73_m2} — ABNORMAL LOW (ref >59)

## 2023-09-14 LAB — PROTEIN / CREATININE RATIO, URINE
Creatinine, Urine: 96 mg/dL
Protein, Ur: 4.1 mg/dL
Protein/Creat Ratio: 43 mg/g{creat} (ref 0–200)

## 2023-09-20 ENCOUNTER — Other Ambulatory Visit (HOSPITAL_COMMUNITY): Payer: Self-pay | Admitting: Neurosurgery

## 2023-09-20 DIAGNOSIS — M4310 Spondylolisthesis, site unspecified: Secondary | ICD-10-CM

## 2023-09-24 ENCOUNTER — Ambulatory Visit (HOSPITAL_COMMUNITY)
Admission: RE | Admit: 2023-09-24 | Discharge: 2023-09-24 | Disposition: A | Discharge status: Home / Self Care | Payer: BC Managed Care – PPO | Source: Ambulatory Visit | Attending: Neurosurgery | Admitting: Neurosurgery

## 2023-09-24 DIAGNOSIS — M4310 Spondylolisthesis, site unspecified: Secondary | ICD-10-CM | POA: Diagnosis present

## 2023-10-01 ENCOUNTER — Other Ambulatory Visit: Payer: Self-pay | Admitting: Internal Medicine

## 2023-10-01 DIAGNOSIS — M51369 Other intervertebral disc degeneration, lumbar region without mention of lumbar back pain or lower extremity pain: Secondary | ICD-10-CM

## 2023-10-01 LAB — BASIC METABOLIC PANEL
BUN/Creatinine Ratio: 13 (ref 9–23)
BUN: 20 mg/dL (ref 6–24)
CO2: 22 mmol/L (ref 20–29)
Calcium: 9.2 mg/dL (ref 8.7–10.2)
Chloride: 107 mmol/L — ABNORMAL HIGH (ref 96–106)
Creatinine, Ser: 1.53 mg/dL — ABNORMAL HIGH (ref 0.57–1.00)
Glucose: 94 mg/dL (ref 70–99)
Potassium: 5.2 mmol/L (ref 3.5–5.2)
Sodium: 143 mmol/L (ref 134–144)
eGFR: 39 mL/min/{1.73_m2} — ABNORMAL LOW (ref >59)

## 2023-10-02 ENCOUNTER — Ambulatory Visit: Payer: BC Managed Care – PPO | Admitting: Internal Medicine

## 2023-10-02 ENCOUNTER — Encounter: Payer: Self-pay | Admitting: Internal Medicine

## 2023-10-02 VITALS — BP 120/73 | HR 91 | Ht 63.0 in | Wt 278.6 lb

## 2023-10-02 DIAGNOSIS — I10 Essential (primary) hypertension: Secondary | ICD-10-CM | POA: Diagnosis not present

## 2023-10-02 DIAGNOSIS — N1831 Chronic kidney disease, stage 3a: Secondary | ICD-10-CM | POA: Diagnosis not present

## 2023-10-02 DIAGNOSIS — N179 Acute kidney failure, unspecified: Secondary | ICD-10-CM | POA: Diagnosis not present

## 2023-10-02 DIAGNOSIS — M5136 Other intervertebral disc degeneration, lumbar region with discogenic back pain only: Secondary | ICD-10-CM

## 2023-10-02 MED ORDER — AMLODIPINE BESYLATE 10 MG PO TABS: 10.0000 mg | ORAL_TABLET | Freq: Every day | ORAL | 1 refills | Status: DC

## 2023-10-02 NOTE — Assessment & Plan Note (Addendum)
Noted on MRI of the lumbar spine - disc osteophyte complex causing nerve root displacement at L5 Has been referred to Spine surgery Referred to PT DC Mobic due to CKD, and advised to take Tylenol as needed Flexeril as needed for muscle spasms Avoid heavy lifting and frequent bending Work note provided to avoid heavy lifting

## 2023-10-02 NOTE — Assessment & Plan Note (Addendum)
Her serum creatinine was elevated recently Likely due to dehydration and recent NSAID use (?) Due to recent AKI, discontinued telmisartan-HCTZ and increased dose of amlodipine to 10 mg QD Advised to maintain adequate hydration

## 2023-10-02 NOTE — Assessment & Plan Note (Addendum)
Discontinue telmisartan-HCTZ and Mobic Check urine protein/creatinine ratio Check US renal Advised to maintain adequate hydration Recheck BMP after 4 weeks, check PTH and phosphorus

## 2023-10-02 NOTE — Patient Instructions (Signed)
Please start taking Amlodipine 10 mg once daily. Stop taking Telmisartan-hydrochlorothiazide.  Maintain at least 64 ounces of fluid intake.  Stop taking Meloxicam. Okay to take Tylenol arthritis for knee and back pain.  Please continue to take medications as prescribed.  Please continue to follow low salt diet and perform moderate exercise/walking at least 150 mins/week.  Please get blood tests done before the next visit.

## 2023-10-02 NOTE — Assessment & Plan Note (Signed)
BP Readings from Last 1 Encounters:  10/02/23 120/73   Well-controlled with Telmisartan-hydrochlorothiazide 40-12.5 mg QD and amlodipine 5 mg once daily Due to recent AKI, discontinued telmisartan-HCTZ and increased dose of amlodipine to 10 mg QD Counseled for compliance with the medications Advised DASH diet and moderate exercise/walking, at least 150 mins/week

## 2023-10-02 NOTE — Progress Notes (Signed)
Established Patient Office Visit  Subjective:  Patient ID: Erica Greer, female    DOB: Jun 06, 1966  Age: 57 y.o. MRN: [id number]  CC:  Chief Complaint  Patient presents with   medication adjustment    Medication adjustment due to recent blood work     HPI Erica Greer is a 57 y.o. female with past medical history of HTN, CKD, chronic low back pain, OA of left knee and morbid obesity who presents for f/u of her chronic medical conditions and recent AKI.  HTN: Her BP was wnl today.  She has been taking telmisartan-HCTZ 40-12.5 mg QD and amlodipine 5 mg QD regularly.  She denies any headache, dizziness, chest pain, dyspnea or palpitations currently.  AKI on CKD: She has history of CKD stage IIIa, GFR used to stay around 50-55.  She had AKI during ER visit, and repeat BMP also showed GFR 39.  She has been trying to increase fluid intake.  Denies any urinary hesitancy or resistance.  Chronic low back pain: She reports chronic low back pain, which is constant, worse with bending and heavy lifting at her workplace and radiates towards her LE.  She denies any numbness or tingling of the LE currently.  MRI of the lumbar spine showed DDD of lumbar spine and mild neural foraminal narrowing.  She has tried physical therapy without much relief.  She has been taking Meloxicam for pain.    Past Medical History:  Diagnosis Date   Arthritis    Phreesia 01/16/2021   Chronic knee pain    HLD (hyperlipidemia)    HTN (hypertension)    Ovarian cyst    right    Subclinical hypothyroidism     Past Surgical History:  Procedure Laterality Date   COLONOSCOPY WITH PROPOFOL N/A 09/08/2021   Procedure: COLONOSCOPY WITH PROPOFOL;  Surgeon: Corbin Ade, MD;  Location: [facility];  Service: Endoscopy;  Laterality: N/A;  12:45pm   LAPAROSCOPIC SALPINGO OOPHERECTOMY Right 10/04/2015   Procedure: LAPAROSCOPIC RIGHT SALPINGO OOPHORECTOMY;  Surgeon: Tilda Burrow, MD;  Location: [facility];  Service:  Gynecology;  Laterality: Right;   LAPAROSCOPIC UNILATERAL SALPINGECTOMY Left 10/04/2015   Procedure: LAPAROSCOPIC LEFT SALPINGECTOMY;  Surgeon: Tilda Burrow, MD;  Location: [facility];  Service: Gynecology;  Laterality: Left;   OVARIAN CYST REMOVAL Left    POLYPECTOMY  09/08/2021   Procedure: POLYPECTOMY;  Surgeon: Corbin Ade, MD;  Location: [facility];  Service: Endoscopy;;  cecal; hepatic flexure   TUBAL LIGATION Left    Unilateral    Family History  Problem Relation Age of Onset   Diabetes Mother    Cancer Father    Seizures Sister    Colon cancer Neg Hx    Colon polyps Neg Hx     Social History   Socioeconomic History   Marital status: Legally Separated    Spouse name: Not on file   Number of children: Not on file   Years of education: Not on file   Highest education level: Not on file  Occupational History   Not on file  Tobacco Use   Smoking status: Never   Smokeless tobacco: Never  Vaping Use   Vaping status: Never Used  Substance and Sexual Activity   Alcohol use: No   Drug use: No   Sexual activity: Yes    Birth control/protection: Surgical  Other Topics Concern   Not on file  Social History Narrative   Not on file   Social  Determinants of Health   Financial Resource Strain: Not on file  Food Insecurity: Not on file  Transportation Needs: Not on file  Physical Activity: Not on file  Stress: Not on file  Social Connections: Not on file  Intimate Partner Violence: Not on file    Outpatient Medications Prior to Visit  Medication Sig Dispense Refill   atorvastatin (LIPITOR) 20 MG tablet Take 1 tablet (20 mg total) by mouth at bedtime. 90 tablet 1   docusate sodium (COLACE) 100 MG capsule Take 1 capsule (100 mg total) by mouth daily as needed for mild constipation. 30 capsule 0   hydrOXYzine (VISTARIL) 25 MG capsule Take 1 capsule (25 mg total) by mouth at bedtime as needed (Insomnia). 30 capsule 2   montelukast (SINGULAIR) 10 MG tablet Take 1  tablet (10 mg total) by mouth at bedtime. 30 tablet 1   amLODipine (NORVASC) 5 MG tablet TAKE 1 TABLET (5 MG TOTAL) BY MOUTH DAILY. 90 tablet 1   cyclobenzaprine (FLEXERIL) 10 MG tablet Take 1 tablet (10 mg total) by mouth at bedtime as needed for muscle spasms. 30 tablet 1   meloxicam (MOBIC) 7.5 MG tablet TAKE 1 TABLET BY MOUTH EVERY DAY 30 tablet 2   telmisartan-hydrochlorothiazide (MICARDIS HCT) 40-12.5 MG tablet Take 1 tablet by mouth daily. 90 tablet 1   No facility-administered medications prior to visit.    Allergies  Allergen Reactions   Hydrocodone Nausea And Vomiting    ROS Review of Systems  Constitutional:  Negative for chills and fever.  HENT:  Negative for congestion, sinus pressure, sinus pain and sore throat.   Respiratory:  Negative for cough and shortness of breath.   Cardiovascular:  Negative for chest pain and palpitations.  Gastrointestinal:  Positive for constipation. Negative for nausea and vomiting.  Genitourinary:  Negative for dysuria and hematuria.  Musculoskeletal:  Positive for back pain. Negative for neck pain and neck stiffness.  Skin:  Negative for rash.  Neurological:  Negative for dizziness and weakness.  Psychiatric/Behavioral:  Positive for sleep disturbance. Negative for agitation and behavioral problems.       Objective:    Physical Exam Vitals reviewed.  Constitutional:      General: She is not in acute distress.    Appearance: She is obese. She is not diaphoretic.  HENT:     Head: Normocephalic and atraumatic.     Nose: Nose normal.     Mouth/Throat:     Mouth: Mucous membranes are moist.  Eyes:     General: No scleral icterus.    Extraocular Movements: Extraocular movements intact.  Cardiovascular:     Rate and Rhythm: Normal rate and regular rhythm.     Pulses: Normal pulses.     Heart sounds: Normal heart sounds. No murmur heard. Pulmonary:     Breath sounds: Normal breath sounds. No wheezing or rales.  Musculoskeletal:         General: Tenderness (Lumbar spine area) present.     Cervical back: Neck supple. No tenderness.     Right lower leg: No edema.     Left lower leg: No edema.  Skin:    General: Skin is warm.     Findings: No rash.  Neurological:     General: No focal deficit present.     Mental Status: She is alert and oriented to person, place, and time.     Cranial Nerves: No cranial nerve deficit.     Sensory: No sensory deficit.  Motor: No weakness.  Psychiatric:        Mood and Affect: Mood normal.        Behavior: Behavior normal.     BP 120/73 (BP Location: Right Arm, Patient Position: Sitting, Cuff Size: Large)   Pulse 91   Ht 5\' 3"  (1.6 m)   Wt 278 lb 9.6 oz (126.4 kg)   LMP 02/11/2015   SpO2 97%   BMI 49.35 kg/m  Wt Readings from Last 3 Encounters:  10/02/23 278 lb 9.6 oz (126.4 kg)  09/12/23 282 lb 6.4 oz (128.1 kg)  09/04/23 260 lb (117.9 kg)    Lab Results  Component Value Date   TSH 2.860 05/20/2023   Lab Results  Component Value Date   WBC 11.4 (H) 05/20/2023   HGB 11.2 (L) 09/04/2023   HCT 33.0 (L) 09/04/2023   MCV 78 (L) 05/20/2023   PLT 216 05/20/2023   Lab Results  Component Value Date   NA 143 09/30/2023   K 5.2 09/30/2023   CO2 22 09/30/2023   GLUCOSE 94 09/30/2023   BUN 20 09/30/2023   CREATININE 1.53 (H) 09/30/2023   BILITOT <0.2 05/20/2023   ALKPHOS 74 05/20/2023   AST 16 05/20/2023   ALT 32 05/20/2023   PROT 6.8 05/20/2023   ALBUMIN 4.3 05/20/2023   CALCIUM 9.2 09/30/2023   ANIONGAP 4 (L) 12/28/2020   EGFR 39 (L) 09/30/2023   Lab Results  Component Value Date   CHOL 135 05/20/2023   Lab Results  Component Value Date   HDL 48 05/20/2023   Lab Results  Component Value Date   LDLCALC 67 05/20/2023   Lab Results  Component Value Date   TRIG 113 05/20/2023   Lab Results  Component Value Date   CHOLHDL 2.8 05/20/2023   Lab Results  Component Value Date   HGBA1C 5.5 05/20/2023      Assessment & Plan:   Problem List  Items Addressed This Visit       Cardiovascular and Mediastinum   Primary hypertension    BP Readings from Last 1 Encounters:  10/02/23 120/73   Well-controlled with Telmisartan-hydrochlorothiazide 40-12.5 mg QD and amlodipine 5 mg once daily Due to recent AKI, discontinued telmisartan-HCTZ and increased dose of amlodipine to 10 mg QD Counseled for compliance with the medications Advised DASH diet and moderate exercise/walking, at least 150 mins/week      Relevant Medications   amLODipine (NORVASC) 10 MG tablet   Other Relevant Orders   Protein / creatinine ratio, urine     Musculoskeletal and Integument   DDD (degenerative disc disease), lumbar    Noted on MRI of the lumbar spine - disc osteophyte complex causing nerve root displacement at L5 Has been referred to Spine surgery Referred to PT DC Mobic due to CKD, and advised to take Tylenol as needed Flexeril as needed for muscle spasms Avoid heavy lifting and frequent bending Work note provided to avoid heavy lifting        Genitourinary   Stage 3a chronic kidney disease (HCC)    Discontinue telmisartan-HCTZ and Mobic Check urine protein/creatinine ratio Check US renal Advised to maintain adequate hydration Recheck BMP after 4 weeks, check PTH and phosphorus      Relevant Orders   Protein / creatinine ratio, urine   Basic Metabolic Panel (BMET)   CBC   Parathyroid hormone, intact (no Ca)   Phosphorus   US Renal   AKI (acute kidney injury) (HCC) - Primary  Her serum creatinine was elevated recently Likely due to dehydration and recent NSAID use (?) Due to recent AKI, discontinued telmisartan-HCTZ and increased dose of amlodipine to 10 mg QD Advised to maintain adequate hydration      Relevant Orders   Protein / creatinine ratio, urine    Meds ordered this encounter  Medications   amLODipine (NORVASC) 10 MG tablet    Sig: Take 1 tablet (10 mg total) by mouth daily.    Dispense:  90 tablet    Refill:  1     Follow-up: Return if symptoms worsen or fail to improve.    Anabel Halon, MD

## 2023-10-04 LAB — PROTEIN / CREATININE RATIO, URINE
Creatinine, Urine: 73.6 mg/dL
Protein, Ur: 4.1 mg/dL
Protein/Creat Ratio: 56 mg/g{creat} (ref 0–200)

## 2023-10-09 ENCOUNTER — Ambulatory Visit (HOSPITAL_COMMUNITY): Payer: BC Managed Care – PPO | Attending: Internal Medicine

## 2023-10-22 ENCOUNTER — Other Ambulatory Visit: Payer: Self-pay | Admitting: Internal Medicine

## 2023-10-22 DIAGNOSIS — M51369 Other intervertebral disc degeneration, lumbar region without mention of lumbar back pain or lower extremity pain: Secondary | ICD-10-CM

## 2023-11-20 ENCOUNTER — Ambulatory Visit: Payer: BC Managed Care – PPO | Admitting: Internal Medicine

## 2023-11-25 ENCOUNTER — Ambulatory Visit
Admission: RE | Admit: 2023-11-25 | Discharge: 2023-11-25 | Disposition: A | Discharge status: Home / Self Care | Payer: 59 | Source: Ambulatory Visit | Attending: Nurse Practitioner | Admitting: Nurse Practitioner

## 2023-11-25 VITALS — BP 152/78 | HR 106 | Temp 100.6°F | Resp 18

## 2023-11-25 DIAGNOSIS — U071 COVID-19: Secondary | ICD-10-CM

## 2023-11-25 LAB — POC COVID19/FLU A&B COMBO
Covid Antigen, POC: POSITIVE — AB
Influenza A Antigen, POC: NEGATIVE
Influenza B Antigen, POC: NEGATIVE

## 2023-11-25 MED ORDER — PAXLOVID (150/100) 10 X 150 MG & 10 X 100MG PO TBPK: 2.0000 | ORAL_TABLET | Freq: Two times a day (BID) | ORAL | 0 refills | Status: DC

## 2023-11-25 MED ORDER — PAXLOVID (150/100) 10 X 150 MG & 10 X 100MG PO TBPK
2.0000 | ORAL_TABLET | Freq: Two times a day (BID) | ORAL | 0 refills | Status: AC
Start: 2023-11-25 — End: 2023-11-30

## 2023-11-25 MED ORDER — PROMETHAZINE-DM 6.25-15 MG/5ML PO SYRP: 5.0000 mL | ORAL_SOLUTION | Freq: Every evening | ORAL | 0 refills | Status: DC | PRN

## 2023-11-25 MED ORDER — FLUTICASONE PROPIONATE 50 MCG/ACT NA SUSP: 2.0000 | Freq: Every day | NASAL | 0 refills | Status: AC

## 2023-11-25 NOTE — Discharge Instructions (Addendum)
 You tested positive for COVID today. Take medication as prescribed. Increase fluids and allow for plenty of rest. Continue over-the-counter Tylenol  as needed for pain, fever, general discomfort. Warm salt water gargles throughout the day to help with sore throat. Normal saline nasal spray for nasal congestion and runny nose.  You may use the nasal spray you have at home to help with the nasal congestion and runny nose. For the cough, recommend using a humidifier in the bedroom at nighttime during sleep and sleeping elevated on pillows while cough symptoms persist. You can return to your normal activities when she has been fever free for 24 hours with no medication.  If you have symptoms, you can return to your normal activities as long as you are wearing a mask.  If you continue to experience symptoms after completing the medication, continue to wear your mask for an additional 5 days. Follow-up in the emergency department immediately if you experience fevers, shortness of breath, difficulty breathing, or other concerns. Please notify your primary care physician of your recent positive COVID test. Follow-up as needed.

## 2023-11-25 NOTE — ED Provider Notes (Signed)
 [facility]    CSN: [id number] Arrival date & time: 11/25/23  1657      History   Chief Complaint Chief Complaint  Patient presents with   Chills    I think I have the flu or something I feel terrible my whole body aching - Entered by patient    HPI Erica Greer is a 58 y.o. female.   The history is provided by the patient.   Patient presents for complaints of bodyaches, fever, chills, nasal congestion, and cough.  Patient states symptoms started over the past 48 hours.  Denies headache, ear pain, ear drainage, wheezing, difficulty breathing, chest pain, abdominal pain, nausea, vomiting, diarrhea, or rash.  Patient reports she has been taking over-the-counter cough and cold medications for her symptoms, along with increasing her fluid intake.  Patient reports that her grandchild was diagnosed with COVID recently. Past Medical History:  Diagnosis Date   Arthritis    Phreesia 01/16/2021   Chronic knee pain    HLD (hyperlipidemia)    HTN (hypertension)    Ovarian cyst    right    Subclinical hypothyroidism     Patient Active Problem List   Diagnosis Date Noted   AKI (acute kidney injury) (HCC) 09/12/2023   Encounter for examination following treatment at hospital 09/12/2023   Mixed hyperlipidemia 01/30/2023   Left elbow pain 04/26/2022   Allergic rhinitis 04/26/2022   DDD (degenerative disc disease), lumbar 02/13/2022   Primary insomnia 11/17/2021   Stage 3a chronic kidney disease (HCC) 11/17/2021   Subclinical hypothyroidism 07/18/2021   Vitamin D  deficiency 07/18/2021   Rectal pain 05/19/2021   Encounter for general adult medical examination with abnormal findings 03/15/2021   Primary hypertension 01/18/2021   Osteoarthritis of left knee 01/18/2021   Cervical spondylosis 01/18/2021   Acute midline low back pain without sciatica 01/18/2021   Morbid obesity (HCC) 01/18/2021   Mass of right ovary 09/16/2015    Past Surgical History:  Procedure  Laterality Date   COLONOSCOPY WITH PROPOFOL  N/A 09/08/2021   Procedure: COLONOSCOPY WITH PROPOFOL ;  Surgeon: Shaaron Lamar HERO, MD;  Location: [facility];  Service: Endoscopy;  Laterality: N/A;  12:45pm   LAPAROSCOPIC SALPINGO OOPHERECTOMY Right 10/04/2015   Procedure: LAPAROSCOPIC RIGHT SALPINGO OOPHORECTOMY;  Surgeon: Norleen Edsel GAILS, MD;  Location: [facility];  Service: Gynecology;  Laterality: Right;   LAPAROSCOPIC UNILATERAL SALPINGECTOMY Left 10/04/2015   Procedure: LAPAROSCOPIC LEFT SALPINGECTOMY;  Surgeon: Norleen Edsel GAILS, MD;  Location: [facility];  Service: Gynecology;  Laterality: Left;   OVARIAN CYST REMOVAL Left    POLYPECTOMY  09/08/2021   Procedure: POLYPECTOMY;  Surgeon: Shaaron Lamar HERO, MD;  Location: [facility];  Service: Endoscopy;;  cecal; hepatic flexure   TUBAL LIGATION Left    Unilateral    OB History     Gravida  5   Para  5   Term  5   Preterm      AB      Living  5      SAB      IAB      Ectopic      Multiple      Live Births               Home Medications    Prior to Admission medications   Medication Sig Start Date End Date Taking? Authorizing Provider  fluticasone  (FLONASE ) 50 MCG/ACT nasal spray Place 2 sprays into both nostrils daily. 11/25/23  Yes Leath-Warren, Etta  J, NP  nirmatrelvir/ritonavir, renal dosing, (PAXLOVID , 150/100,) 10 x 150 MG & 10 x 100MG  TBPK Take 2 tablets by mouth 2 (two) times daily for 5 days. Dosage for moderate renal impairment (eGFR >/= 30 to <60 mL/min): 150 mg nirmatrelvir (one 150 mg tablet) with 100 mg ritonavir (one 100 mg tablet), with both tablets taken together twice daily for 5 days. Not recommended if eGFR < 30 mL/min.  PAXLOVID  is not recommend in patients with severe hepatic impairment (Child-Pugh Class C). 11/25/23 11/30/23 Yes Leath-Warren, Etta PARAS, NP  promethazine -dextromethorphan (PROMETHAZINE -DM) 6.25-15 MG/5ML syrup Take 5 mLs by mouth at bedtime as needed. 11/25/23  Yes Leath-Warren,  Etta PARAS, NP  amLODipine  (NORVASC ) 10 MG tablet Take 1 tablet (10 mg total) by mouth daily. 10/02/23   Tobie Suzzane POUR, MD  atorvastatin  (LIPITOR) 20 MG tablet Take 1 tablet (20 mg total) by mouth at bedtime. 05/20/23   Tobie Suzzane POUR, MD  cyclobenzaprine  (FLEXERIL ) 10 MG tablet TAKE 1 TABLET BY MOUTH AT BEDTIME AS NEEDED FOR MUSCLE SPASMS 10/02/23   Tobie Suzzane POUR, MD  docusate sodium  (COLACE) 100 MG capsule Take 1 capsule (100 mg total) by mouth daily as needed for mild constipation. 07/31/21   Tobie Suzzane POUR, MD  hydrOXYzine  (VISTARIL ) 25 MG capsule Take 1 capsule (25 mg total) by mouth at bedtime as needed (Insomnia). 11/17/21   Tobie Suzzane POUR, MD  montelukast  (SINGULAIR ) 10 MG tablet Take 1 tablet (10 mg total) by mouth at bedtime. 04/26/22   Antonetta Rollene BRAVO, MD    Family History Family History  Problem Relation Age of Onset   Diabetes Mother    Cancer Father    Seizures Sister    Colon cancer Neg Hx    Colon polyps Neg Hx     Social History Social History   Tobacco Use   Smoking status: Never   Smokeless tobacco: Never  Vaping Use   Vaping status: Never Used  Substance Use Topics   Alcohol use: No   Drug use: No     Allergies   Hydrocodone    Review of Systems Review of Systems Per HPI  Physical Exam Triage Vital Signs ED Triage Vitals  Encounter Vitals Group     BP 11/25/23 1707 (!) 152/78     Systolic BP Percentile --      Diastolic BP Percentile --      Pulse Rate 11/25/23 1707 (!) 106     Resp 11/25/23 1707 18     Temp 11/25/23 1707 (!) 100.6 F (38.1 C)     Temp Source 11/25/23 1707 Oral     SpO2 11/25/23 1707 94 %     Weight --      Height --      Head Circumference --      Peak Flow --      Pain Score 11/25/23 1710 10     Pain Loc --      Pain Education --      Exclude from Growth Chart --    No data found.  Updated Vital Signs BP (!) 152/78 (BP Location: Right Arm)   Pulse (!) 106   Temp (!) 100.6 F (38.1 C) (Oral)   Resp 18    LMP 02/11/2015   SpO2 94%   Visual Acuity Right Eye Distance:   Left Eye Distance:   Bilateral Distance:    Right Eye Near:   Left Eye Near:    Bilateral Near:     Physical  Exam Vitals and nursing note reviewed.  Constitutional:      General: She is not in acute distress.    Appearance: Normal appearance.  HENT:     Head: Normocephalic.     Right Ear: Tympanic membrane, ear canal and external ear normal.     Left Ear: Tympanic membrane, ear canal and external ear normal.     Nose: Congestion present.     Right Turbinates: Enlarged and swollen.     Left Turbinates: Enlarged and swollen.     Right Sinus: No maxillary sinus tenderness or frontal sinus tenderness.     Left Sinus: No maxillary sinus tenderness or frontal sinus tenderness.     Mouth/Throat:     Lips: Pink.     Mouth: Mucous membranes are moist.     Pharynx: Uvula midline. Postnasal drip present. No pharyngeal swelling, oropharyngeal exudate, posterior oropharyngeal erythema or uvula swelling.     Comments: Cobblestoning present to posterior oropharynx  Eyes:     Extraocular Movements: Extraocular movements intact.     Conjunctiva/sclera: Conjunctivae normal.     Pupils: Pupils are equal, round, and reactive to light.  Cardiovascular:     Rate and Rhythm: Normal rate and regular rhythm.     Pulses: Normal pulses.     Heart sounds: Normal heart sounds.  Pulmonary:     Effort: Pulmonary effort is normal. No respiratory distress.     Breath sounds: Normal breath sounds. No stridor. No wheezing, rhonchi or rales.  Abdominal:     General: Bowel sounds are normal.     Palpations: Abdomen is soft.     Tenderness: There is no abdominal tenderness.  Musculoskeletal:     Cervical back: Normal range of motion.  Lymphadenopathy:     Cervical: No cervical adenopathy.  Neurological:     General: No focal deficit present.     Mental Status: She is alert and oriented to person, place, and time.  Psychiatric:         Mood and Affect: Mood normal.        Behavior: Behavior normal.      UC Treatments / Results  Labs (all labs ordered are listed, but only abnormal results are displayed) Labs Reviewed  POC COVID19/FLU A&B COMBO - Abnormal; Notable for the following components:      Result Value   Covid Antigen, POC Positive (*)    All other components within normal limits    EKG   Radiology No results found.  Procedures Procedures (including critical care time)  Medications Ordered in UC Medications - No data to display  Initial Impression / Assessment and Plan / UC Course  I have reviewed the triage vital signs and the nursing notes.  Pertinent labs & imaging results that were available during my care of the patient were reviewed by me and considered in my medical decision making (see chart for details).  On exam, lung sounds are clear throughout, room air sats at 94%.  Influenza/COVID test was positive for COVID.  Will start patient on Paxlovid , renal dosing based on lab work dated 09/30/2023.  Symptomatic treatment was also provided with Promethazine  DM for cough, and fluticasone  50 micro nasal spray for nasal congestion and runny nose.  Supportive care recommendations were provided and discussed with the patient to include fluids, rest, over-the-counter Tylenol , use of a humidifier at nighttime during sleep.  Discussed indications with the patient regarding ER follow-up.  Patient was in agreement with this plan of care  and verbalized understanding.  All questions were answered.  Patient stable for discharge.  Work note was provided.  Final Clinical Impressions(s) / UC Diagnoses   Final diagnoses:  COVID     Discharge Instructions      You tested positive for COVID today. Take medication as prescribed. Increase fluids and allow for plenty of rest. Continue over-the-counter Tylenol  as needed for pain, fever, general discomfort. Warm salt water gargles throughout the day to help with  sore throat. Normal saline nasal spray for nasal congestion and runny nose.  You may use the nasal spray you have at home to help with the nasal congestion and runny nose. For the cough, recommend using a humidifier in the bedroom at nighttime during sleep and sleeping elevated on pillows while cough symptoms persist. You can return to your normal activities when she has been fever free for 24 hours with no medication.  If you have symptoms, you can return to your normal activities as long as you are wearing a mask.  If you continue to experience symptoms after completing the medication, continue to wear your mask for an additional 5 days. Follow-up in the emergency department immediately if you experience fevers, shortness of breath, difficulty breathing, or other concerns. Please notify your primary care physician of your recent positive COVID test. Follow-up as needed.     ED Prescriptions     Medication Sig Dispense Auth. Provider   nirmatrelvir/ritonavir, renal dosing, (PAXLOVID , 150/100,) 10 x 150 MG & 10 x 100MG  TBPK Take 2 tablets by mouth 2 (two) times daily for 5 days. Dosage for moderate renal impairment (eGFR >/= 30 to <60 mL/min): 150 mg nirmatrelvir (one 150 mg tablet) with 100 mg ritonavir (one 100 mg tablet), with both tablets taken together twice daily for 5 days. Not recommended if eGFR < 30 mL/min.  PAXLOVID  is not recommend in patients with severe hepatic impairment (Child-Pugh Class C). 20 tablet Leath-Warren, Etta PARAS, NP   promethazine -dextromethorphan (PROMETHAZINE -DM) 6.25-15 MG/5ML syrup Take 5 mLs by mouth at bedtime as needed. 118 mL Leath-Warren, Etta PARAS, NP   fluticasone  (FLONASE ) 50 MCG/ACT nasal spray Place 2 sprays into both nostrils daily. 16 g Leath-Warren, Etta PARAS, NP      PDMP not reviewed this encounter.   Gilmer Etta PARAS, NP 11/25/23 1727

## 2023-11-25 NOTE — ED Triage Notes (Signed)
 Pt reports body aches congestion chills was exposed to COVID.

## 2023-12-02 ENCOUNTER — Ambulatory Visit: Payer: 59 | Admitting: Internal Medicine

## 2023-12-02 VITALS — BP 130/72 | HR 74 | Ht 63.0 in | Wt 281.8 lb

## 2023-12-02 DIAGNOSIS — N1831 Chronic kidney disease, stage 3a: Secondary | ICD-10-CM

## 2023-12-02 DIAGNOSIS — E782 Mixed hyperlipidemia: Secondary | ICD-10-CM | POA: Diagnosis not present

## 2023-12-02 DIAGNOSIS — I10 Essential (primary) hypertension: Secondary | ICD-10-CM

## 2023-12-02 DIAGNOSIS — M5136 Other intervertebral disc degeneration, lumbar region with discogenic back pain only: Secondary | ICD-10-CM

## 2023-12-02 DIAGNOSIS — U071 COVID-19: Secondary | ICD-10-CM | POA: Insufficient documentation

## 2023-12-02 NOTE — Assessment & Plan Note (Signed)
BP Readings from Last 1 Encounters:  12/02/23 130/72   Well-controlled with amlodipine 10 mg once daily Due to recent AKI, had discontinued telmisartan-HCTZ Counseled for compliance with the medications Advised DASH diet and moderate exercise/walking, at least 150 mins/week

## 2023-12-02 NOTE — Assessment & Plan Note (Signed)
Diet modification and moderate exercise advised Will discuss about Bariatric surgery option later. 

## 2023-12-02 NOTE — Assessment & Plan Note (Signed)
Urgent care visit note reviewed Completed Paxlovid Has residual cough, continue promethazine-DM syrup as needed for cough

## 2023-12-02 NOTE — Assessment & Plan Note (Signed)
On Lipitor 20 mg QD 

## 2023-12-02 NOTE — Progress Notes (Addendum)
 Established Patient Office Visit  Subjective:  Patient ID: Erica Greer, female    DOB: [birth year]  Age: 58 y.o. MRN: [id number]  CC:  Chief Complaint  Patient presents with   Hypertension   Back Pain    HPI Erica Greer is a 58 y.o. female with past medical history of HTN, chronic low back pain, OA of left knee and morbid obesity who presents for f/u of her chronic medical conditions.  BP is well-controlled. Takes medications regularly. Patient denies headache, dizziness, chest pain, dyspnea or palpitations.  CKD: Her serum creatinine was elevated at 1.60 during ER visit. Her S. Cr. Was 1.24 in 07/24. She did not get repeat blood tests done. She has been advised to increase fluid intake in the past.   DDD of lumbar spine: She has chronic low back pain, for which she had MRI of lumbar spine, which showed - Right foraminal/far lateral disc osteophyte complex at L5-S1 causing mild displacement of the exiting right L5 nerve root. Advanced facet degenerative changes at L3-4 and L4-5 resulting in trace anterolisthesis at these levels. She takes Flexeril  as needed, which improved her pain.  Denies any recent heavy lifting or frequent bending.  She has stopped working at [employer] and has started working at [employer] center in the [employer].  She has spine surgeon, and had an epidural injection with adequate response.        Past Medical History:  Diagnosis Date   Arthritis    [employer] 01/16/2021   Chronic knee pain    HLD (hyperlipidemia)    HTN (hypertension)    Ovarian cyst    right    Subclinical hypothyroidism     Past Surgical History:  Procedure Laterality Date   COLONOSCOPY WITH PROPOFOL  N/A 09/08/2021   Procedure: COLONOSCOPY WITH PROPOFOL ;  Surgeon: Shaaron Lamar HERO, MD;  Location: [facility];  Service: Endoscopy;  Laterality: N/A;  12:45pm   LAPAROSCOPIC SALPINGO OOPHERECTOMY Right 10/04/2015   Procedure: LAPAROSCOPIC RIGHT SALPINGO  OOPHORECTOMY;  Surgeon: Norleen Edsel GAILS, MD;  Location: [facility];  Service: Gynecology;  Laterality: Right;   LAPAROSCOPIC UNILATERAL SALPINGECTOMY Left 10/04/2015   Procedure: LAPAROSCOPIC LEFT SALPINGECTOMY;  Surgeon: Norleen Edsel GAILS, MD;  Location: [facility];  Service: Gynecology;  Laterality: Left;   OVARIAN CYST REMOVAL Left    POLYPECTOMY  09/08/2021   Procedure: POLYPECTOMY;  Surgeon: Shaaron Lamar HERO, MD;  Location: [facility];  Service: Endoscopy;;  cecal; hepatic flexure   TUBAL LIGATION Left    Unilateral    Family History  Problem Relation Age of Onset   Diabetes Mother    Cancer Father    Seizures Sister    Colon cancer Neg Hx    Colon polyps Neg Hx     Social History   Socioeconomic History   Marital status: Legally Separated    Spouse name: Not on file   Number of children: Not on file   Years of education: Not on file   Highest education level: 12th grade  Occupational History   Not on file  Tobacco Use   Smoking status: Never   Smokeless tobacco: Never  Vaping Use   Vaping status: Never Used  Substance and Sexual Activity   Alcohol use: No   Drug use: No   Sexual activity: Yes    Birth control/protection: Surgical  Other Topics Concern   Not on file  Social History Narrative   Not on file   Social Drivers  of Health   Financial Resource Strain: Low Risk  (12/02/2023)   Overall Financial Resource Strain (CARDIA)    Difficulty of Paying Living Expenses: Not very hard  Food Insecurity: No Food Insecurity (12/02/2023)   Hunger Vital Sign    Worried About Running Out of Food in the Last Year: Never true    Ran Out of Food in the Last Year: Never true  Transportation Needs: No Transportation Needs (12/02/2023)   PRAPARE - Administrator, Civil Service (Medical): No    Lack of Transportation (Non-Medical): No  Physical Activity: Unknown (12/02/2023)   Exercise Vital Sign    Days of Exercise per Week: Patient declined    Minutes of Exercise per  Session: Not on file  Stress: No Stress Concern Present (12/02/2023)   [employer] of Occupational Health - Occupational Stress Questionnaire    Feeling of Stress : Not at all  Social Connections: Unknown (12/02/2023)   Social Connection and Isolation Panel [NHANES]    Frequency of Communication with Friends and Family: More than three times a week    Frequency of Social Gatherings with Friends and Family: Twice a week    Attends Religious Services: More than 4 times per year    Active Member of [employer] or Organizations: No    Attends Engineer, structural: Not on file    Marital Status: Patient declined  Intimate Partner Violence: Not on file    Outpatient Medications Prior to Visit  Medication Sig Dispense Refill   amLODipine  (NORVASC ) 10 MG tablet Take 1 tablet (10 mg total) by mouth daily. 90 tablet 1   atorvastatin  (LIPITOR) 20 MG tablet Take 1 tablet (20 mg total) by mouth at bedtime. 90 tablet 1   cyclobenzaprine  (FLEXERIL ) 10 MG tablet TAKE 1 TABLET BY MOUTH AT BEDTIME AS NEEDED FOR MUSCLE SPASMS 30 tablet 1   docusate sodium  (COLACE) 100 MG capsule Take 1 capsule (100 mg total) by mouth daily as needed for mild constipation. 30 capsule 0   fluticasone  (FLONASE ) 50 MCG/ACT nasal spray Place 2 sprays into both nostrils daily. 16 g 0   hydrOXYzine  (VISTARIL ) 25 MG capsule Take 1 capsule (25 mg total) by mouth at bedtime as needed (Insomnia). 30 capsule 2   montelukast  (SINGULAIR ) 10 MG tablet Take 1 tablet (10 mg total) by mouth at bedtime. 30 tablet 1   promethazine -dextromethorphan (PROMETHAZINE -DM) 6.25-15 MG/5ML syrup Take 5 mLs by mouth at bedtime as needed. 118 mL 0   No facility-administered medications prior to visit.    Allergies  Allergen Reactions   Hydrocodone  Nausea And Vomiting    ROS Review of Systems  Constitutional:  Negative for chills and fever.  HENT:  Negative for congestion, sinus pressure, sinus pain and sore throat.   Respiratory:   Negative for cough and shortness of breath.   Cardiovascular:  Negative for chest pain and palpitations.  Gastrointestinal:  Positive for constipation. Negative for nausea and vomiting.  Genitourinary:  Negative for dysuria and hematuria.  Musculoskeletal:  Positive for back pain. Negative for neck pain and neck stiffness.  Skin:  Negative for rash.  Neurological:  Negative for dizziness and weakness.  Psychiatric/Behavioral:  Positive for sleep disturbance. Negative for agitation and behavioral problems.       Objective:    Physical Exam Vitals reviewed.  Constitutional:      General: She is not in acute distress.    Appearance: She is obese. She is not diaphoretic.  HENT:  Head: Normocephalic and atraumatic.     Nose: Nose normal.     Mouth/Throat:     Mouth: Mucous membranes are moist.  Eyes:     General: No scleral icterus.    Extraocular Movements: Extraocular movements intact.  Cardiovascular:     Rate and Rhythm: Normal rate and regular rhythm.     Pulses: Normal pulses.     Heart sounds: Normal heart sounds. No murmur heard. Pulmonary:     Breath sounds: Normal breath sounds. No wheezing or rales.  Musculoskeletal:        General: Tenderness (Lumbar spine area) present.     Cervical back: Neck supple. No tenderness.     Right lower leg: No edema.     Left lower leg: No edema.  Skin:    General: Skin is warm.     Findings: No rash.  Neurological:     General: No focal deficit present.     Mental Status: She is alert and oriented to person, place, and time.     Cranial Nerves: No cranial nerve deficit.     Sensory: No sensory deficit.     Motor: No weakness.  Psychiatric:        Mood and Affect: Mood normal.        Behavior: Behavior normal.     BP 130/72   Pulse 74   Ht 5' 3 (1.6 m)   Wt 281 lb 12.8 oz (127.8 kg)   LMP 02/11/2015   SpO2 97%   BMI 49.92 kg/m  Wt Readings from Last 3 Encounters:  12/02/23 281 lb 12.8 oz (127.8 kg)  10/02/23 278  lb 9.6 oz (126.4 kg)  09/12/23 282 lb 6.4 oz (128.1 kg)    Lab Results  Component Value Date   TSH 2.860 05/20/2023   Lab Results  Component Value Date   WBC 11.4 (H) 05/20/2023   HGB 11.2 (L) 09/04/2023   HCT 33.0 (L) 09/04/2023   MCV 78 (L) 05/20/2023   PLT 216 05/20/2023   Lab Results  Component Value Date   NA 143 09/30/2023   K 5.2 09/30/2023   CO2 22 09/30/2023   GLUCOSE 94 09/30/2023   BUN 20 09/30/2023   CREATININE 1.53 (H) 09/30/2023   BILITOT <0.2 05/20/2023   ALKPHOS 74 05/20/2023   AST 16 05/20/2023   ALT 32 05/20/2023   PROT 6.8 05/20/2023   ALBUMIN 4.3 05/20/2023   CALCIUM  9.2 09/30/2023   ANIONGAP 4 (L) 12/28/2020   EGFR 39 (L) 09/30/2023   Lab Results  Component Value Date   CHOL 135 05/20/2023   Lab Results  Component Value Date   HDL 48 05/20/2023   Lab Results  Component Value Date   LDLCALC 67 05/20/2023   Lab Results  Component Value Date   TRIG 113 05/20/2023   Lab Results  Component Value Date   CHOLHDL 2.8 05/20/2023   Lab Results  Component Value Date   HGBA1C 5.5 05/20/2023      Assessment & Plan:   Problem List Items Addressed This Visit       Cardiovascular and Mediastinum   Primary hypertension - Primary   BP Readings from Last 1 Encounters:  12/02/23 130/72   Well-controlled with amlodipine  10 mg once daily Due to recent AKI, had discontinued telmisartan -HCTZ Counseled for compliance with the medications Advised DASH diet and moderate exercise/walking, at least 150 mins/week        Musculoskeletal and Integument   DDD (degenerative disc  disease), lumbar   Noted on MRI of the lumbar spine - disc osteophyte complex causing nerve root displacement at L5 Has been evaluated by Spine surgery and PM&R - had epidural injection Referred to PT DC Mobic  due to CKD, and advised to take Tylenol  as needed Flexeril  as needed for muscle spasms Avoid heavy lifting and frequent bending Work note provided to avoid heavy  lifting        Genitourinary   Stage 3a chronic kidney disease (HCC)   Discontinue telmisartan -HCTZ and Mobic  Checked urine protein/creatinine ratio Check US  renal Advised to maintain adequate hydration Recheck BMP, check PTH and phosphorus today        Other   Morbid obesity (HCC)   Diet modification and moderate exercise advised Will discuss about Bariatric surgery option later      Mixed hyperlipidemia   On Lipitor 20 mg QD      COVID-19   Urgent care visit note reviewed Completed Paxlovid  Has residual cough, continue promethazine -DM syrup as needed for cough        No orders of the defined types were placed in this encounter.   Follow-up: Return in about 4 months (around 03/31/2024) for HTN and CKD.    Erica MARLA Blanch, MD

## 2023-12-02 NOTE — Assessment & Plan Note (Addendum)
Discontinue telmisartan-HCTZ and Mobic Checked urine protein/creatinine ratio Check US renal Advised to maintain adequate hydration Recheck BMP, check PTH and phosphorus today

## 2023-12-02 NOTE — Patient Instructions (Signed)
Please continue to take medications as prescribed.  Please continue to follow low salt diet and perform moderate exercise/walking at least 150 mins/week. 

## 2023-12-02 NOTE — Assessment & Plan Note (Signed)
Noted on MRI of the lumbar spine - disc osteophyte complex causing nerve root displacement at L5 Has been evaluated by Spine surgery and PM&R - had epidural injection Referred to PT DC Mobic due to CKD, and advised to take Tylenol as needed Flexeril as needed for muscle spasms Avoid heavy lifting and frequent bending Work note provided to avoid heavy lifting

## 2023-12-04 LAB — BASIC METABOLIC PANEL
BUN/Creatinine Ratio: 11 (ref 9–23)
BUN: 14 mg/dL (ref 6–24)
CO2: 24 mmol/L (ref 20–29)
Calcium: 9.4 mg/dL (ref 8.7–10.2)
Chloride: 108 mmol/L — ABNORMAL HIGH (ref 96–106)
Creatinine, Ser: 1.28 mg/dL — ABNORMAL HIGH (ref 0.57–1.00)
Glucose: 91 mg/dL (ref 70–99)
Potassium: 5 mmol/L (ref 3.5–5.2)
Sodium: 146 mmol/L — ABNORMAL HIGH (ref 134–144)
eGFR: 48 mL/min/{1.73_m2} — ABNORMAL LOW (ref >59)

## 2023-12-04 LAB — CBC
Hematocrit: 36.9 % (ref 34.0–46.6)
Hemoglobin: 11.5 g/dL (ref 11.1–15.9)
MCH: 24.4 pg — ABNORMAL LOW (ref 26.6–33.0)
MCHC: 31.2 g/dL — ABNORMAL LOW (ref 31.5–35.7)
MCV: 78 fL — ABNORMAL LOW (ref 79–97)
Platelets: 303 10*3/uL (ref 150–450)
RBC: 4.72 x10E6/uL (ref 3.77–5.28)
RDW: 13.2 % (ref 11.7–15.4)
WBC: 15.1 10*3/uL — ABNORMAL HIGH (ref 3.4–10.8)

## 2023-12-04 LAB — PARATHYROID HORMONE, INTACT (NO CA): PTH: 129 pg/mL — ABNORMAL HIGH (ref 15–65)

## 2023-12-04 LAB — PHOSPHORUS: Phosphorus: 3.5 mg/dL (ref 3.0–4.3)

## 2023-12-24 ENCOUNTER — Encounter: Payer: Self-pay | Admitting: Internal Medicine

## 2023-12-24 ENCOUNTER — Other Ambulatory Visit: Payer: Self-pay

## 2023-12-24 DIAGNOSIS — I10 Essential (primary) hypertension: Secondary | ICD-10-CM

## 2023-12-24 MED ORDER — AMLODIPINE BESYLATE 10 MG PO TABS: 10.0000 mg | ORAL_TABLET | Freq: Every day | ORAL | 1 refills | Status: DC

## 2023-12-30 ENCOUNTER — Encounter: Payer: Self-pay | Admitting: Internal Medicine

## 2024-01-31 ENCOUNTER — Institutional Professional Consult (permissible substitution): Admitting: Plastic Surgery

## 2024-02-07 ENCOUNTER — Other Ambulatory Visit: Payer: Self-pay

## 2024-02-07 ENCOUNTER — Encounter: Payer: Self-pay | Admitting: Internal Medicine

## 2024-02-07 DIAGNOSIS — I10 Essential (primary) hypertension: Secondary | ICD-10-CM

## 2024-02-07 MED ORDER — AMLODIPINE BESYLATE 10 MG PO TABS: 10.0000 mg | ORAL_TABLET | Freq: Every day | ORAL | 1 refills | Status: DC

## 2024-02-20 ENCOUNTER — Institutional Professional Consult (permissible substitution): Admitting: Plastic Surgery

## 2024-03-09 ENCOUNTER — Encounter: Payer: Self-pay | Admitting: Internal Medicine

## 2024-03-09 ENCOUNTER — Other Ambulatory Visit: Payer: Self-pay

## 2024-03-09 DIAGNOSIS — I10 Essential (primary) hypertension: Secondary | ICD-10-CM

## 2024-03-09 MED ORDER — MONTELUKAST SODIUM 10 MG PO TABS: 10.0000 mg | ORAL_TABLET | Freq: Every day | ORAL | 1 refills | Status: DC

## 2024-03-09 MED ORDER — AMLODIPINE BESYLATE 10 MG PO TABS: 10.0000 mg | ORAL_TABLET | Freq: Every day | ORAL | 1 refills | Status: DC

## 2024-03-23 ENCOUNTER — Encounter (HOSPITAL_COMMUNITY): Payer: Self-pay

## 2024-03-30 ENCOUNTER — Encounter: Payer: Self-pay | Admitting: Internal Medicine

## 2024-03-30 ENCOUNTER — Ambulatory Visit: Payer: 59 | Admitting: Internal Medicine

## 2024-03-30 VITALS — BP 130/73 | HR 76 | Ht 64.0 in | Wt 278.0 lb

## 2024-03-30 DIAGNOSIS — M5136 Other intervertebral disc degeneration, lumbar region with discogenic back pain only: Secondary | ICD-10-CM | POA: Diagnosis not present

## 2024-03-30 DIAGNOSIS — I10 Essential (primary) hypertension: Secondary | ICD-10-CM | POA: Diagnosis not present

## 2024-03-30 DIAGNOSIS — N1831 Chronic kidney disease, stage 3a: Secondary | ICD-10-CM | POA: Diagnosis not present

## 2024-03-30 DIAGNOSIS — E782 Mixed hyperlipidemia: Secondary | ICD-10-CM | POA: Diagnosis not present

## 2024-03-30 DIAGNOSIS — Z1231 Encounter for screening mammogram for malignant neoplasm of breast: Secondary | ICD-10-CM

## 2024-03-30 MED ORDER — ATORVASTATIN CALCIUM 20 MG PO TABS: 20.0000 mg | ORAL_TABLET | Freq: Every day | ORAL | 1 refills | Status: DC

## 2024-03-30 NOTE — Assessment & Plan Note (Signed)
 Noted on MRI of the lumbar spine - disc osteophyte complex causing nerve root displacement at L5 Has been evaluated by Spine surgery and PM&R - had epidural injection Referred to PT DCed Mobic  due to CKD, and advised to take Tylenol  as needed Flexeril  as needed for muscle spasms Avoid heavy lifting and frequent bending Work note provided to avoid heavy lifting

## 2024-03-30 NOTE — Assessment & Plan Note (Addendum)
 Discontinued telmisartan -HCTZ and Mobic  previously Checked urine protein/creatinine ratio Check US  renal - needs to schedule it Advised to maintain adequate hydration Recheck BMP

## 2024-03-30 NOTE — Assessment & Plan Note (Signed)
 BMI Readings from Last 3 Encounters:  03/30/24 47.72 kg/m  12/02/23 49.92 kg/m  10/02/23 49.35 kg/m   Has been trying low-carb diet and increasing physical activity, but has struggled to lose weight Advised to continue to follow low-carb diet and perform moderate exercise/walking at least 150 minutes/week Discussed about medical weight loss options, including their benefits and side effects -she is a good candidate for Wegovy, but cost/insurance coverage is a concern Will discuss about Bariatric surgery option later

## 2024-03-30 NOTE — Patient Instructions (Addendum)
 Please schedule Mammogram.  Please continue to take medications as prescribed.  Please continue to follow low carb diet and perform moderate exercise/walking at least 150 mins/week.

## 2024-03-30 NOTE — Assessment & Plan Note (Addendum)
 On Lipitor 20 mg QD, needs to be more compliant

## 2024-03-30 NOTE — Assessment & Plan Note (Signed)
 BP Readings from Last 1 Encounters:  03/30/24 130/73   Well-controlled with amlodipine  10 mg once daily Due to recent AKI, had discontinued telmisartan -HCTZ Counseled for compliance with the medications Advised DASH diet and moderate exercise/walking, at least 150 mins/week

## 2024-03-30 NOTE — Progress Notes (Addendum)
 Established Patient Office Visit  Subjective:  Patient ID: Erica Greer, female    DOB: [birth year]  Age: 58 y.o. MRN: [id number]  CC:  Chief Complaint  Patient presents with   Medical Management of Chronic Issues    4 month f/u , would like to discuss weight loss options, reports changing her diet and being more active and has been unable to lose weight.     HPI Erica Greer is a 58 y.o. female with past medical history of HTN, chronic low back pain, OA of left knee and morbid obesity who presents for f/u of her chronic medical conditions.  BP is well-controlled. Takes medications regularly. Patient denies headache, dizziness, chest pain, dyspnea or palpitations.  CKD: Her serum creatinine was elevated at 1.60 during ER visit, but improved to 1.28 in 01/25. She has stopped Mobic  and telmisartan -HCTZ.  She has increased fluid intake now.  Denies dysuria, hematuria or urinary hesitancy or resistance.  DDD of lumbar spine: She has chronic low back pain, for which she had MRI of lumbar spine, which showed - Right foraminal/far lateral disc osteophyte complex at L5-S1 causing mild displacement of the exiting right L5 nerve root. Advanced facet degenerative changes at L3-4 and L4-5 resulting in trace anterolisthesis at these levels. She takes Flexeril  as needed, which improved her pain.  Denies any recent heavy lifting or frequent bending.  She has stopped working at [employer] and has started working at [employer] center in the [employer].  She has seen spine surgeon, and had an epidural injection with adequate response.  Obesity: She has been trying to improve her diet and get more physically active.  She has struggled to lose weight despite her efforts.  She wants to discuss medical weight loss options.      Past Medical History:  Diagnosis Date   Arthritis    Phreesia 01/16/2021   Chronic knee pain    HLD (hyperlipidemia)    HTN (hypertension)    Ovarian  cyst    right    Subclinical hypothyroidism     Past Surgical History:  Procedure Laterality Date   COLONOSCOPY WITH PROPOFOL  N/A 09/08/2021   Procedure: COLONOSCOPY WITH PROPOFOL ;  Surgeon: Shaaron Lamar HERO, MD;  [facility];  Service: Endoscopy;  Laterality: N/A;  12:45pm   LAPAROSCOPIC SALPINGO OOPHERECTOMY Right 10/04/2015   Procedure: LAPAROSCOPIC RIGHT SALPINGO OOPHORECTOMY;  Surgeon: Norleen Edsel GAILS, MD;  Location: [facility];  Service: Gynecology;  Laterality: Right;   LAPAROSCOPIC UNILATERAL SALPINGECTOMY Left 10/04/2015   Procedure: LAPAROSCOPIC LEFT SALPINGECTOMY;  Surgeon: Norleen Edsel GAILS, MD;  Location: [facility];  Service: Gynecology;  Laterality: Left;   OVARIAN CYST REMOVAL Left    POLYPECTOMY  09/08/2021   Procedure: POLYPECTOMY;  Surgeon: Shaaron Lamar HERO, MD;  Location: [facility];  Service: Endoscopy;;  cecal; hepatic flexure   TUBAL LIGATION Left    Unilateral    Family History  Problem Relation Age of Onset   Diabetes Mother    Cancer Father    Seizures Sister    Colon cancer Neg Hx    Colon polyps Neg Hx     Social History   Socioeconomic History   Marital status: Legally Separated    Spouse name: Not on file   Number of children: Not on file   Years of education: Not on file   Highest education level: 12th grade  Occupational History   Not on file  Tobacco Use   Smoking  status: Never   Smokeless tobacco: Never  Vaping Use   Vaping status: Never Used  Substance and Sexual Activity   Alcohol use: No   Drug use: No   Sexual activity: Yes    Birth control/protection: Surgical  Other Topics Concern   Not on file  Social History Narrative   Not on file   Social Drivers of Health   Financial Resource Strain: Low Risk  (12/02/2023)   Overall Financial Resource Strain (CARDIA)    Difficulty of Paying Living Expenses: Not very hard  Food Insecurity: No Food Insecurity (12/02/2023)   Hunger Vital Sign    Worried About Running Out of Food  in the Last Year: Never true    Ran Out of Food in the Last Year: Never true  Transportation Needs: No Transportation Needs (12/02/2023)   PRAPARE - Administrator, Civil Service (Medical): No    Lack of Transportation (Non-Medical): No  Physical Activity: Unknown (12/02/2023)   Exercise Vital Sign    Days of Exercise per Week: Patient declined    Minutes of Exercise per Session: Not on file  Stress: No Stress Concern Present (12/02/2023)   [employer] of Occupational Health - Occupational Stress Questionnaire    Feeling of Stress : Not at all  Social Connections: Unknown (12/02/2023)   Social Connection and Isolation Panel [NHANES]    Frequency of Communication with Friends and Family: More than three times a week    Frequency of Social Gatherings with Friends and Family: Twice a week    Attends Religious Services: More than 4 times per year    Active Member of [employer] or Organizations: No    Attends Engineer, structural: Not on file    Marital Status: Patient declined  Intimate Partner Violence: Not on file    Outpatient Medications Prior to Visit  Medication Sig Dispense Refill   amLODipine  (NORVASC ) 10 MG tablet Take 1 tablet (10 mg total) by mouth daily. 90 tablet 1   cyclobenzaprine  (FLEXERIL ) 10 MG tablet TAKE 1 TABLET BY MOUTH AT BEDTIME AS NEEDED FOR MUSCLE SPASMS 30 tablet 1   docusate sodium  (COLACE) 100 MG capsule Take 1 capsule (100 mg total) by mouth daily as needed for mild constipation. 30 capsule 0   fluticasone  (FLONASE ) 50 MCG/ACT nasal spray Place 2 sprays into both nostrils daily. 16 g 0   hydrOXYzine  (VISTARIL ) 25 MG capsule Take 1 capsule (25 mg total) by mouth at bedtime as needed (Insomnia). 30 capsule 2   montelukast  (SINGULAIR ) 10 MG tablet Take 1 tablet (10 mg total) by mouth at bedtime. 30 tablet 1   atorvastatin  (LIPITOR) 20 MG tablet Take 1 tablet (20 mg total) by mouth at bedtime. 90 tablet 1   promethazine -dextromethorphan  (PROMETHAZINE -DM) 6.25-15 MG/5ML syrup Take 5 mLs by mouth at bedtime as needed. 118 mL 0   No facility-administered medications prior to visit.    Allergies  Allergen Reactions   Hydrocodone  Nausea And Vomiting    ROS Review of Systems  Constitutional:  Negative for chills and fever.  HENT:  Negative for congestion, sinus pressure, sinus pain and sore throat.   Respiratory:  Negative for cough and shortness of breath.   Cardiovascular:  Negative for chest pain and palpitations.  Gastrointestinal:  Positive for constipation. Negative for nausea and vomiting.  Genitourinary:  Negative for dysuria and hematuria.  Musculoskeletal:  Positive for back pain. Negative for neck pain and neck stiffness.  Skin:  Negative for rash.  Neurological:  Negative for dizziness and weakness.  Psychiatric/Behavioral:  Positive for sleep disturbance. Negative for agitation and behavioral problems.       Objective:    Physical Exam Vitals reviewed.  Constitutional:      General: She is not in acute distress.    Appearance: She is obese. She is not diaphoretic.  HENT:     Head: Normocephalic and atraumatic.     Nose: Nose normal.     Mouth/Throat:     Mouth: Mucous membranes are moist.  Eyes:     General: No scleral icterus.    Extraocular Movements: Extraocular movements intact.  Cardiovascular:     Rate and Rhythm: Normal rate and regular rhythm.     Heart sounds: Normal heart sounds. No murmur heard. Pulmonary:     Breath sounds: Normal breath sounds. No wheezing or rales.  Musculoskeletal:        General: Tenderness (Lumbar spine area) present.     Cervical back: Neck supple. No tenderness.     Right lower leg: No edema.     Left lower leg: No edema.  Skin:    General: Skin is warm.     Findings: No rash.  Neurological:     General: No focal deficit present.     Mental Status: She is alert and oriented to person, place, and time.     Sensory: No sensory deficit.     Motor: No  weakness.  Psychiatric:        Mood and Affect: Mood normal.        Behavior: Behavior normal.     BP 130/73   Pulse 76   Ht 5' 4 (1.626 m)   Wt 278 lb (126.1 kg)   LMP 02/11/2015   SpO2 97%   BMI 47.72 kg/m  Wt Readings from Last 3 Encounters:  03/30/24 278 lb (126.1 kg)  12/02/23 281 lb 12.8 oz (127.8 kg)  10/02/23 278 lb 9.6 oz (126.4 kg)    Lab Results  Component Value Date   TSH 2.860 05/20/2023   Lab Results  Component Value Date   WBC 15.1 (H) 12/02/2023   HGB 11.5 12/02/2023   HCT 36.9 12/02/2023   MCV 78 (L) 12/02/2023   PLT 303 12/02/2023   Lab Results  Component Value Date   NA 146 (H) 12/02/2023   K 5.0 12/02/2023   CO2 24 12/02/2023   GLUCOSE 91 12/02/2023   BUN 14 12/02/2023   CREATININE 1.28 (H) 12/02/2023   BILITOT <0.2 05/20/2023   ALKPHOS 74 05/20/2023   AST 16 05/20/2023   ALT 32 05/20/2023   PROT 6.8 05/20/2023   ALBUMIN 4.3 05/20/2023   CALCIUM  9.4 12/02/2023   ANIONGAP 4 (L) 12/28/2020   EGFR 48 (L) 12/02/2023   Lab Results  Component Value Date   CHOL 135 05/20/2023   Lab Results  Component Value Date   HDL 48 05/20/2023   Lab Results  Component Value Date   LDLCALC 67 05/20/2023   Lab Results  Component Value Date   TRIG 113 05/20/2023   Lab Results  Component Value Date   CHOLHDL 2.8 05/20/2023   Lab Results  Component Value Date   HGBA1C 5.5 05/20/2023      Assessment & Plan:   Problem List Items Addressed This Visit       Cardiovascular and Mediastinum   Primary hypertension - Primary   BP Readings from Last 1 Encounters:  03/30/24 130/73   Well-controlled with  amlodipine  10 mg once daily Due to recent AKI, had discontinued telmisartan -HCTZ Counseled for compliance with the medications Advised DASH diet and moderate exercise/walking, at least 150 mins/week      Relevant Medications   atorvastatin  (LIPITOR) 20 MG tablet   Other Relevant Orders   CBC   Basic Metabolic Panel (BMET)      Musculoskeletal and Integument   DDD (degenerative disc disease), lumbar   Noted on MRI of the lumbar spine - disc osteophyte complex causing nerve root displacement at L5 Has been evaluated by Spine surgery and PM&R - had epidural injection Referred to PT DCed Mobic  due to CKD, and advised to take Tylenol  as needed Flexeril  as needed for muscle spasms Avoid heavy lifting and frequent bending Work note provided to avoid heavy lifting        Genitourinary   Stage 3a chronic kidney disease (HCC)   Discontinued telmisartan -HCTZ and Mobic  previously Checked urine protein/creatinine ratio Check US  renal - needs to schedule it Advised to maintain adequate hydration Recheck BMP      Relevant Orders   CBC   Basic Metabolic Panel (BMET)     Other   Morbid obesity (HCC)   BMI Readings from Last 3 Encounters:  03/30/24 47.72 kg/m  12/02/23 49.92 kg/m  10/02/23 49.35 kg/m   Has been trying low-carb diet and increasing physical activity, but has struggled to lose weight Advised to continue to follow low-carb diet and perform moderate exercise/walking at least 150 minutes/week Discussed about medical weight loss options, including their benefits and side effects -she is a good candidate for Wegovy, but cost/insurance coverage is a concern Will discuss about Bariatric surgery option later      Mixed hyperlipidemia   On Lipitor 20 mg QD, needs to be more compliant      Relevant Medications   atorvastatin  (LIPITOR) 20 MG tablet   Other Visit Diagnoses       Breast cancer screening by mammogram       Relevant Orders   MM 3D SCREENING MAMMOGRAM BILATERAL BREAST        Meds ordered this encounter  Medications   atorvastatin  (LIPITOR) 20 MG tablet    Sig: Take 1 tablet (20 mg total) by mouth at bedtime.    Dispense:  90 tablet    Refill:  1    Follow-up: Return in about 4 months (around 07/31/2024) for HTN and CKD.    Suzzane MARLA Blanch, MD

## 2024-03-31 ENCOUNTER — Ambulatory Visit: Payer: Self-pay | Admitting: Internal Medicine

## 2024-03-31 LAB — CBC
Hematocrit: 39 % (ref 34.0–46.6)
Hemoglobin: 12.3 g/dL (ref 11.1–15.9)
MCH: 24.2 pg — ABNORMAL LOW (ref 26.6–33.0)
MCHC: 31.5 g/dL (ref 31.5–35.7)
MCV: 77 fL — ABNORMAL LOW (ref 79–97)
Platelets: 230 10*3/uL (ref 150–450)
RBC: 5.09 x10E6/uL (ref 3.77–5.28)
RDW: 13.9 % (ref 11.7–15.4)
WBC: 9 10*3/uL (ref 3.4–10.8)

## 2024-03-31 LAB — BASIC METABOLIC PANEL WITH GFR
BUN/Creatinine Ratio: 12 (ref 9–23)
BUN: 12 mg/dL (ref 6–24)
CO2: 23 mmol/L (ref 20–29)
Calcium: 9.6 mg/dL (ref 8.7–10.2)
Chloride: 108 mmol/L — ABNORMAL HIGH (ref 96–106)
Creatinine, Ser: 1.04 mg/dL — ABNORMAL HIGH (ref 0.57–1.00)
Glucose: 88 mg/dL (ref 70–99)
Potassium: 5 mmol/L (ref 3.5–5.2)
Sodium: 146 mmol/L — ABNORMAL HIGH (ref 134–144)
eGFR: 62 mL/min/{1.73_m2} (ref >59)

## 2024-04-03 ENCOUNTER — Other Ambulatory Visit: Payer: Self-pay | Admitting: Internal Medicine

## 2024-04-03 ENCOUNTER — Ambulatory Visit (HOSPITAL_COMMUNITY)

## 2024-04-08 ENCOUNTER — Ambulatory Visit (HOSPITAL_COMMUNITY)

## 2024-06-09 ENCOUNTER — Encounter: Payer: Self-pay | Admitting: Internal Medicine

## 2024-06-09 ENCOUNTER — Ambulatory Visit: Admitting: Orthopaedic Surgery

## 2024-06-09 DIAGNOSIS — G8929 Other chronic pain: Secondary | ICD-10-CM

## 2024-06-09 DIAGNOSIS — M545 Low back pain, unspecified: Secondary | ICD-10-CM | POA: Diagnosis not present

## 2024-06-09 MED ORDER — TRAMADOL HCL 50 MG PO TABS: 50.0000 mg | ORAL_TABLET | Freq: Every day | ORAL | 0 refills | Status: DC | PRN

## 2024-06-09 NOTE — Progress Notes (Signed)
 Office Visit Note   Patient: Erica Greer           Date of Birth: 12-Nov-1966           MRN: [id number] Visit Date: 06/09/2024              Requested by: Tobie Suzzane POUR, MD [address] [place],  [place] [address] PCP: Tobie Suzzane POUR, MD   Assessment & Plan: Visit Diagnoses:  1. Chronic midline low back pain without sciatica     Plan: History of Present Illness Erica Greer is a 58 year old female with chronic low back pain who presents for worsening symptoms. She was referred by Dr. Malcolm for evaluation of chronic low back pain and consideration of further treatment options.  She has experienced low back pain for over a year, with progressive worsening. The pain is constant and does not radiate to the legs. There is no associated weakness, numbness, or tingling in the legs. Previous treatments include physical therapy and back injections, which provided limited relief. Prednisone  was prescribed to reduce inflammation, but pain persists.  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 An MRI was conducted by a previous provider, who discussed various treatment options and decided against surgery at that time.  She has stopped drinking sodas to lose weight, which was discussed as a contributing factor to her back pain.  Results RADIOLOGY Lumbar spine MRI: Degenerative changes in the lumbar spine  Assessment and Plan Chronic low back pain Chronic low back pain exacerbated by weight, MRI performed. Surgery considered last resort due to risks and uncertain outcomes. Emphasized weight loss and lifestyle changes to alleviate symptoms and avoid surgery. - Prescribed short supply of pain medication, sent to [facility] in [place]. - Emphasized weight loss to reduce lumbar spine stress and potentially avoid surgery. - Recommended comprehensive lifestyle change including diet and exercise. -  Discussed option of pain management clinic for ongoing pain control if needed.  Will prescribe one-time prescription of tramadol .  Follow-Up Instructions: No follow-ups on file.   Orders:  No orders of the defined types were placed in this encounter.  Meds ordered this encounter  Medications   traMADol  (ULTRAM ) 50 MG tablet    Sig: Take 1-2 tablets (50-100 mg total) by mouth daily as needed.    Dispense:  20 tablet    Refill:  0      Procedures: No procedures performed   Clinical Data: No additional findings.   Subjective: Chief Complaint  Patient presents with   Lower Back - Pain    HPI  Review of Systems  Constitutional: Negative.   HENT: Negative.    Eyes: Negative.   Respiratory: Negative.    Cardiovascular: Negative.   Endocrine: Negative.   Musculoskeletal: Negative.   Neurological: Negative.   Hematological: Negative.   Psychiatric/Behavioral: Negative.    All other systems reviewed and are negative.    Objective: Vital Signs: LMP 02/11/2015   Physical Exam Vitals and nursing note reviewed.  Constitutional:      Appearance: She is well-developed.  HENT:     Head: Atraumatic.     Nose: Nose normal.  Eyes:     Extraocular Movements: Extraocular movements intact.  Cardiovascular:     Pulses: Normal pulses.  Pulmonary:     Effort: Pulmonary effort is normal.  Abdominal:     Palpations: Abdomen is soft.  Musculoskeletal:  Cervical back: Neck supple.  Skin:    General: Skin is warm.     Capillary Refill: Capillary refill takes less than 2 seconds.  Neurological:     Mental Status: She is alert. Mental status is at baseline.  Psychiatric:        Behavior: Behavior normal.        Thought Content: Thought content normal.        Judgment: Judgment normal.     Ortho Exam  Specialty Comments:  No specialty comments available.  Imaging: No results found.   PMFS History: Patient Active Problem List   Diagnosis Date Noted    COVID-19 12/02/2023   AKI (acute kidney injury) (HCC) 09/12/2023   Encounter for examination following treatment at hospital 09/12/2023   Mixed hyperlipidemia 01/30/2023   Left elbow pain 04/26/2022   Allergic rhinitis 04/26/2022   DDD (degenerative disc disease), lumbar 02/13/2022   Primary insomnia 11/17/2021   Stage 3a chronic kidney disease (HCC) 11/17/2021   Subclinical hypothyroidism 07/18/2021   Vitamin D  deficiency 07/18/2021   Rectal pain 05/19/2021   Encounter for general adult medical examination with abnormal findings 03/15/2021   Primary hypertension 01/18/2021   Osteoarthritis of left knee 01/18/2021   Cervical spondylosis 01/18/2021   Morbid obesity (HCC) 01/18/2021   Mass of right ovary 09/16/2015   Past Medical History:  Diagnosis Date   Arthritis    Phreesia 01/16/2021   Chronic knee pain    HLD (hyperlipidemia)    HTN (hypertension)    Ovarian cyst    right    Subclinical hypothyroidism     Family History  Problem Relation Age of Onset   Diabetes Mother    Cancer Father    Seizures Sister    Colon cancer Neg Hx    Colon polyps Neg Hx     Past Surgical History:  Procedure Laterality Date   COLONOSCOPY WITH PROPOFOL  N/A 09/08/2021   Procedure: COLONOSCOPY WITH PROPOFOL ;  Surgeon: Shaaron Lamar HERO, MD;  [facility];  Service: Endoscopy;  Laterality: N/A;  12:45pm   LAPAROSCOPIC SALPINGO OOPHERECTOMY Right 10/04/2015   Procedure: LAPAROSCOPIC RIGHT SALPINGO OOPHORECTOMY;  Surgeon: Norleen Edsel GAILS, MD;  Location: [facility];  Service: Gynecology;  Laterality: Right;   LAPAROSCOPIC UNILATERAL SALPINGECTOMY Left 10/04/2015   Procedure: LAPAROSCOPIC LEFT SALPINGECTOMY;  Surgeon: Norleen Edsel GAILS, MD;  Location: [facility];  Service: Gynecology;  Laterality: Left;   OVARIAN CYST REMOVAL Left    POLYPECTOMY  09/08/2021   Procedure: POLYPECTOMY;  Surgeon: Shaaron Lamar HERO, MD;  [facility];  Service: Endoscopy;;  cecal; hepatic flexure   TUBAL  LIGATION Left    Unilateral   Social History   Occupational History   Not on file  Tobacco Use   Smoking status: Never   Smokeless tobacco: Never  Vaping Use   Vaping status: Never Used  Substance and Sexual Activity   Alcohol use: No   Drug use: No   Sexual activity: Yes    Birth control/protection: Surgical

## 2024-07-03 ENCOUNTER — Encounter: Payer: Self-pay | Admitting: Radiology

## 2024-07-24 ENCOUNTER — Encounter: Payer: Self-pay | Admitting: Internal Medicine

## 2024-07-24 ENCOUNTER — Ambulatory Visit: Admitting: Internal Medicine

## 2024-07-24 VITALS — BP 121/74 | HR 75 | Ht 64.0 in | Wt 276.4 lb

## 2024-07-24 DIAGNOSIS — N1831 Chronic kidney disease, stage 3a: Secondary | ICD-10-CM

## 2024-07-24 DIAGNOSIS — Z0001 Encounter for general adult medical examination with abnormal findings: Secondary | ICD-10-CM | POA: Diagnosis not present

## 2024-07-24 DIAGNOSIS — R7303 Prediabetes: Secondary | ICD-10-CM | POA: Insufficient documentation

## 2024-07-24 DIAGNOSIS — Z23 Encounter for immunization: Secondary | ICD-10-CM | POA: Diagnosis not present

## 2024-07-24 DIAGNOSIS — M5136 Other intervertebral disc degeneration, lumbar region with discogenic back pain only: Secondary | ICD-10-CM

## 2024-07-24 DIAGNOSIS — E782 Mixed hyperlipidemia: Secondary | ICD-10-CM | POA: Diagnosis not present

## 2024-07-24 DIAGNOSIS — I10 Essential (primary) hypertension: Secondary | ICD-10-CM | POA: Diagnosis not present

## 2024-07-24 MED ORDER — SPIKEVAX 50 MCG/0.5ML IM SUSY: 0.5000 mL | PREFILLED_SYRINGE | Freq: Once | INTRAMUSCULAR | 0 refills | Status: AC

## 2024-07-24 NOTE — Assessment & Plan Note (Signed)
 Noted on MRI of the lumbar spine - disc osteophyte complex causing nerve root displacement at L5 Has been evaluated by Spine surgery and PM&R - had epidural injection Referred to PT DCed Mobic  due to CKD, and advised to take Tylenol  as needed Flexeril  as needed for muscle spasms Avoid heavy lifting and frequent bending

## 2024-07-24 NOTE — Assessment & Plan Note (Signed)
 Lab Results  Component Value Date   HGBA1C 5.5 05/20/2023   Advised to continue to follow low carb diet

## 2024-07-24 NOTE — Assessment & Plan Note (Signed)
 BMI Readings from Last 3 Encounters:  07/24/24 47.44 kg/m  03/30/24 47.72 kg/m  12/02/23 49.92 kg/m   Has been trying low-carb diet and increasing physical activity, but has struggled to lose weight Advised to continue to follow low-carb diet and perform moderate exercise/walking at least 150 minutes/week Discussed about medical weight loss options, including their benefits and side effects -she is a good candidate for Wegovy, but cost/insurance coverage is a concern Will discuss about Bariatric surgery option later

## 2024-07-24 NOTE — Assessment & Plan Note (Signed)
 Annual exam as documented. Counseling done  re healthy lifestyle involving commitment to 150 minutes exercise per week, heart healthy diet, and attaining healthy weight.The importance of adequate sleep also discussed.  Immunization and cancer screening needs are specifically addressed at this visit.

## 2024-07-24 NOTE — Patient Instructions (Addendum)
 Please schedule mammogram or provide phone number.  Please continue to take medications as prescribed.  Please continue to follow low carb diet and perform moderate exercise/walking at least 150 mins/week.  Please get fasting blood tests done within a week.

## 2024-07-24 NOTE — Assessment & Plan Note (Addendum)
 Discontinued telmisartan -HCTZ and Mobic  previously Checked urine protein/creatinine ratio Advised to maintain adequate hydration Recheck CMP

## 2024-07-24 NOTE — Assessment & Plan Note (Signed)
 BP Readings from Last 1 Encounters:  07/24/24 121/74   Well-controlled with amlodipine  10 mg QD Due to recent AKI, had discontinued telmisartan -HCTZ Counseled for compliance with the medications Advised DASH diet and moderate exercise/walking, at least 150 mins/week

## 2024-07-24 NOTE — Assessment & Plan Note (Signed)
 On Lipitor 20 mg QD, needs to be more compliant

## 2024-07-24 NOTE — Progress Notes (Signed)
 Established Patient Office Visit  Subjective:  Patient ID: Erica Greer, female    DOB: [birth year]  Age: 58 y.o. MRN: [id number]  CC:  Chief Complaint  Patient presents with   Hypertension    4 month f/u    Chronic Kidney Disease    4 month f/u    Obesity    Has weight concerns, unable to lose weight has been trying on her own.     HPI Erica Greer is a 58 y.o. female with past medical history of HTN, chronic low back pain, OA of left knee and morbid obesity who presents for annual physical.  BP is well-controlled. Takes medications regularly. Patient denies headache, dizziness, chest pain, dyspnea or palpitations.  CKD: Her serum creatinine has improved to 1.04 now.  She has stopped Mobic  and telmisartan -HCTZ.  She has increased fluid intake now.  Denies dysuria, hematuria or urinary hesitancy or resistance.  DDD of lumbar spine: She has chronic low back pain, for which she had MRI of lumbar spine, which showed - Right foraminal/postero lateral disc osteophyte complex at L5-S1 causing mild displacement of the exiting right L5 nerve root. Advanced facet degenerative changes at L3-4 and L4-5 resulting in trace anterolisthesis at these levels. She takes Flexeril  as needed, which improved her pain.  Denies any recent heavy lifting or frequent bending.  She has stopped working at [employer] and has started working at [employer] center in the [employer].  She has seen spine surgeon, and had an epidural injection with adequate response.  Obesity: She has been trying to improve her diet and get more physically active.  She has struggled to lose weight despite her efforts.  She wants to discuss medical weight loss options.      Past Medical History:  Diagnosis Date   Arthritis    [employer] 01/16/2021   Chronic knee pain    HLD (hyperlipidemia)    HTN (hypertension)    Ovarian cyst    right    Subclinical hypothyroidism     Past Surgical History:   Procedure Laterality Date   COLONOSCOPY WITH PROPOFOL  N/A 09/08/2021   Procedure: COLONOSCOPY WITH PROPOFOL ;  Surgeon: Shaaron Lamar HERO, MD;  Location: [facility];  Service: Endoscopy;  Laterality: N/A;  12:45pm   LAPAROSCOPIC SALPINGO OOPHERECTOMY Right 10/04/2015   Procedure: LAPAROSCOPIC RIGHT SALPINGO OOPHORECTOMY;  Surgeon: Norleen Edsel GAILS, MD;  Location: [facility];  Service: Gynecology;  Laterality: Right;   LAPAROSCOPIC UNILATERAL SALPINGECTOMY Left 10/04/2015   Procedure: LAPAROSCOPIC LEFT SALPINGECTOMY;  Surgeon: Norleen Edsel GAILS, MD;  Location: [facility];  Service: Gynecology;  Laterality: Left;   OVARIAN CYST REMOVAL Left    POLYPECTOMY  09/08/2021   Procedure: POLYPECTOMY;  Surgeon: Shaaron Lamar HERO, MD;  Location: [facility];  Service: Endoscopy;;  cecal; hepatic flexure   TUBAL LIGATION Left    Unilateral    Family History  Problem Relation Age of Onset   Diabetes Mother    Cancer Father    Seizures Sister    Colon cancer Neg Hx    Colon polyps Neg Hx     Social History   Socioeconomic History   Marital status: Legally Separated    Spouse name: Not on file   Number of children: Not on file   Years of education: Not on file   Highest education level: 12th grade  Occupational History   Not on file  Tobacco Use   Smoking status: Never   Smokeless tobacco:  Never  Vaping Use   Vaping status: Never Used  Substance and Sexual Activity   Alcohol use: No   Drug use: No   Sexual activity: Yes    Birth control/protection: Surgical  Other Topics Concern   Not on file  Social History Narrative   Not on file   Social Drivers of Health   Financial Resource Strain: Low Risk  (12/02/2023)   Overall Financial Resource Strain (CARDIA)    Difficulty of Paying Living Expenses: Not very hard  Food Insecurity: No Food Insecurity (12/02/2023)   Hunger Vital Sign    Worried About Running Out of Food in the Last Year: Never true    Ran Out of Food in the Last Year: Never true   Transportation Needs: No Transportation Needs (12/02/2023)   PRAPARE - Administrator, Civil Service (Medical): No    Lack of Transportation (Non-Medical): No  Physical Activity: Unknown (12/02/2023)   Exercise Vital Sign    Days of Exercise per Week: Patient declined    Minutes of Exercise per Session: Not on file  Stress: No Stress Concern Present (12/02/2023)   [employer] of Occupational Health - Occupational Stress Questionnaire    Feeling of Stress : Not at all  Social Connections: Unknown (12/02/2023)   Social Connection and Isolation Panel    Frequency of Communication with Friends and Family: More than three times a week    Frequency of Social Gatherings with Friends and Family: Twice a week    Attends Religious Services: More than 4 times per year    Active Member of [employer] or Organizations: No    Attends Engineer, structural: Not on file    Marital Status: Patient declined  Intimate Partner Violence: Not on file    Outpatient Medications Prior to Visit  Medication Sig Dispense Refill   amLODipine  (NORVASC ) 10 MG tablet Take 1 tablet (10 mg total) by mouth daily. 90 tablet 1   atorvastatin  (LIPITOR) 20 MG tablet Take 1 tablet (20 mg total) by mouth at bedtime. 90 tablet 1   cyclobenzaprine  (FLEXERIL ) 10 MG tablet TAKE 1 TABLET BY MOUTH AT BEDTIME AS NEEDED FOR MUSCLE SPASMS 30 tablet 1   docusate sodium  (COLACE) 100 MG capsule Take 1 capsule (100 mg total) by mouth daily as needed for mild constipation. 30 capsule 0   fluticasone  (FLONASE ) 50 MCG/ACT nasal spray Place 2 sprays into both nostrils daily. 16 g 0   hydrOXYzine  (VISTARIL ) 25 MG capsule Take 1 capsule (25 mg total) by mouth at bedtime as needed (Insomnia). 30 capsule 2   traMADol  (ULTRAM ) 50 MG tablet Take 1-2 tablets (50-100 mg total) by mouth daily as needed. 20 tablet 0   montelukast  (SINGULAIR ) 10 MG tablet TAKE 1 TABLET BY MOUTH EVERYDAY AT BEDTIME 90 tablet 1   No  facility-administered medications prior to visit.    Allergies  Allergen Reactions   Hydrocodone  Nausea And Vomiting    ROS Review of Systems  Constitutional:  Negative for chills and fever.  HENT:  Negative for congestion, sinus pressure, sinus pain and sore throat.   Respiratory:  Negative for cough and shortness of breath.   Cardiovascular:  Negative for chest pain and palpitations.  Gastrointestinal:  Positive for constipation. Negative for nausea and vomiting.  Genitourinary:  Negative for dysuria and hematuria.  Musculoskeletal:  Positive for back pain. Negative for neck pain and neck stiffness.  Skin:  Negative for rash.  Neurological:  Negative for dizziness and  weakness.  Psychiatric/Behavioral:  Positive for sleep disturbance. Negative for agitation and behavioral problems.       Objective:    Physical Exam Vitals reviewed.  Constitutional:      General: She is not in acute distress.    Appearance: She is obese. She is not diaphoretic.  HENT:     Head: Normocephalic and atraumatic.     Nose: Nose normal.     Mouth/Throat:     Mouth: Mucous membranes are moist.  Eyes:     General: No scleral icterus.    Extraocular Movements: Extraocular movements intact.  Cardiovascular:     Rate and Rhythm: Normal rate and regular rhythm.     Heart sounds: Normal heart sounds. No murmur heard. Pulmonary:     Breath sounds: Normal breath sounds. No wheezing or rales.  Abdominal:     Palpations: Abdomen is soft.     Tenderness: There is no abdominal tenderness.  Musculoskeletal:        General: Tenderness (Lumbar spine area) present.     Cervical back: Neck supple. No tenderness.     Right lower leg: No edema.     Left lower leg: No edema.  Skin:    General: Skin is warm.     Findings: No rash.  Neurological:     General: No focal deficit present.     Mental Status: She is alert and oriented to person, place, and time.     Sensory: No sensory deficit.     Motor: No  weakness.  Psychiatric:        Mood and Affect: Mood normal.        Behavior: Behavior normal.     BP 121/74   Pulse 75   Ht 5' 4 (1.626 m)   Wt 276 lb 6.4 oz (125.4 kg)   LMP 02/11/2015   SpO2 100%   BMI 47.44 kg/m  Wt Readings from Last 3 Encounters:  07/24/24 276 lb 6.4 oz (125.4 kg)  03/30/24 278 lb (126.1 kg)  12/02/23 281 lb 12.8 oz (127.8 kg)    Lab Results  Component Value Date   TSH 2.860 05/20/2023   Lab Results  Component Value Date   WBC 9.0 03/30/2024   HGB 12.3 03/30/2024   HCT 39.0 03/30/2024   MCV 77 (L) 03/30/2024   PLT 230 03/30/2024   Lab Results  Component Value Date   NA 146 (H) 03/30/2024   K 5.0 03/30/2024   CO2 23 03/30/2024   GLUCOSE 88 03/30/2024   BUN 12 03/30/2024   CREATININE 1.04 (H) 03/30/2024   BILITOT <0.2 05/20/2023   ALKPHOS 74 05/20/2023   AST 16 05/20/2023   ALT 32 05/20/2023   PROT 6.8 05/20/2023   ALBUMIN 4.3 05/20/2023   CALCIUM  9.6 03/30/2024   ANIONGAP 4 (L) 12/28/2020   EGFR 62 03/30/2024   Lab Results  Component Value Date   CHOL 135 05/20/2023   Lab Results  Component Value Date   HDL 48 05/20/2023   Lab Results  Component Value Date   LDLCALC 67 05/20/2023   Lab Results  Component Value Date   TRIG 113 05/20/2023   Lab Results  Component Value Date   CHOLHDL 2.8 05/20/2023   Lab Results  Component Value Date   HGBA1C 5.5 05/20/2023      Assessment & Plan:   Problem List Items Addressed This Visit       Cardiovascular and Mediastinum   Primary hypertension   BP  Readings from Last 1 Encounters:  07/24/24 121/74   Well-controlled with amlodipine  10 mg QD Due to recent AKI, had discontinued telmisartan -HCTZ Counseled for compliance with the medications Advised DASH diet and moderate exercise/walking, at least 150 mins/week      Relevant Orders   TSH   CMP14+EGFR   CBC with Differential/Platelet     Musculoskeletal and Integument   DDD (degenerative disc disease), lumbar    Noted on MRI of the lumbar spine - disc osteophyte complex causing nerve root displacement at L5 Has been evaluated by Spine surgery and PM&R - had epidural injection Referred to PT DCed Mobic  due to CKD, and advised to take Tylenol  as needed Flexeril  as needed for muscle spasms Avoid heavy lifting and frequent bending        Genitourinary   Stage 3a chronic kidney disease (HCC)   Discontinued telmisartan -HCTZ and Mobic  previously Checked urine protein/creatinine ratio Advised to maintain adequate hydration Recheck CMP      Relevant Orders   VITAMIN D  25 Hydroxy (Vit-D Deficiency, Fractures)   CMP14+EGFR   CBC with Differential/Platelet     Other   Morbid obesity (HCC)   BMI Readings from Last 3 Encounters:  07/24/24 47.44 kg/m  03/30/24 47.72 kg/m  12/02/23 49.92 kg/m   Has been trying low-carb diet and increasing physical activity, but has struggled to lose weight Advised to continue to follow low-carb diet and perform moderate exercise/walking at least 150 minutes/week Discussed about medical weight loss options, including their benefits and side effects -she is a good candidate for Wegovy, but cost/insurance coverage is a concern Will discuss about Bariatric surgery option later      Encounter for general adult medical examination with abnormal findings - Primary   Annual exam as documented. Counseling done  re healthy lifestyle involving commitment to 150 minutes exercise per week, heart healthy diet, and attaining healthy weight.The importance of adequate sleep also discussed. Immunization and cancer screening needs are specifically addressed at this visit.      Mixed hyperlipidemia   On Lipitor 20 mg QD, needs to be more compliant      Relevant Orders   Lipid panel   Need for COVID-19 vaccine   Considering her chronic medical conditions and her work environment, she needs to get COVID vaccine, prescription provided.      Relevant Medications   COVID-19  mRNA vaccine (SPIKEVAX ) syringe   Prediabetes   Lab Results  Component Value Date   HGBA1C 5.5 05/20/2023   Advised to continue to follow low carb diet      Relevant Orders   Hemoglobin A1c      Meds ordered this encounter  Medications   COVID-19 mRNA vaccine (SPIKEVAX ) syringe    Sig: Inject 0.5 mLs into the muscle once for 1 dose.    Dispense:  0.5 mL    Refill:  0    Follow-up: Return in about 6 months (around 01/21/2025) for HTN and CKD.    Suzzane MARLA Blanch, MD

## 2024-07-24 NOTE — Assessment & Plan Note (Signed)
 Considering her chronic medical conditions and her work environment, she needs to get COVID vaccine, prescription provided.

## 2024-07-27 ENCOUNTER — Encounter: Payer: Self-pay | Admitting: Internal Medicine

## 2024-07-27 ENCOUNTER — Other Ambulatory Visit: Payer: Self-pay | Admitting: Internal Medicine

## 2024-07-27 DIAGNOSIS — M5136 Other intervertebral disc degeneration, lumbar region with discogenic back pain only: Secondary | ICD-10-CM

## 2024-07-27 MED ORDER — TRAMADOL HCL 50 MG PO TABS: 50.0000 mg | ORAL_TABLET | Freq: Two times a day (BID) | ORAL | 0 refills | Status: DC | PRN

## 2024-07-28 ENCOUNTER — Other Ambulatory Visit (HOSPITAL_COMMUNITY): Payer: Self-pay

## 2024-07-28 ENCOUNTER — Other Ambulatory Visit: Payer: Self-pay | Admitting: Internal Medicine

## 2024-07-28 ENCOUNTER — Ambulatory Visit: Payer: Self-pay | Admitting: Internal Medicine

## 2024-07-28 ENCOUNTER — Telehealth: Payer: Self-pay | Admitting: Pharmacy Technician

## 2024-07-28 DIAGNOSIS — E559 Vitamin D deficiency, unspecified: Secondary | ICD-10-CM

## 2024-07-28 LAB — CMP14+EGFR
ALT: 11 IU/L (ref 0–32)
AST: 13 IU/L (ref 0–40)
Albumin: 4.2 g/dL (ref 3.8–4.9)
Alkaline Phosphatase: 73 IU/L (ref 51–125)
BUN/Creatinine Ratio: 12 (ref 9–23)
BUN: 13 mg/dL (ref 6–24)
Bilirubin Total: 0.4 mg/dL (ref 0.0–1.2)
CO2: 23 mmol/L (ref 20–29)
Calcium: 9.4 mg/dL (ref 8.7–10.2)
Chloride: 106 mmol/L (ref 96–106)
Creatinine, Ser: 1.13 mg/dL — ABNORMAL HIGH (ref 0.57–1.00)
Globulin, Total: 2.7 g/dL (ref 1.5–4.5)
Glucose: 86 mg/dL (ref 70–99)
Potassium: 4.5 mmol/L (ref 3.5–5.2)
Sodium: 144 mmol/L (ref 134–144)
Total Protein: 6.9 g/dL (ref 6.0–8.5)
eGFR: 56 mL/min/1.73 — ABNORMAL LOW (ref >59)

## 2024-07-28 LAB — CBC WITH DIFFERENTIAL/PLATELET
Basophils Absolute: 0 x10E3/uL (ref 0.0–0.2)
Basos: 0 %
EOS (ABSOLUTE): 0.4 x10E3/uL (ref 0.0–0.4)
Eos: 4 %
Hematocrit: 39 % (ref 34.0–46.6)
Hemoglobin: 12.2 g/dL (ref 11.1–15.9)
Immature Grans (Abs): 0 x10E3/uL (ref 0.0–0.1)
Immature Granulocytes: 0 %
Lymphocytes Absolute: 2.7 x10E3/uL (ref 0.7–3.1)
Lymphs: 27 %
MCH: 24.5 pg — ABNORMAL LOW (ref 26.6–33.0)
MCHC: 31.3 g/dL — ABNORMAL LOW (ref 31.5–35.7)
MCV: 79 fL (ref 79–97)
Monocytes Absolute: 0.8 x10E3/uL (ref 0.1–0.9)
Monocytes: 9 %
Neutrophils Absolute: 5.9 x10E3/uL (ref 1.4–7.0)
Neutrophils: 60 %
Platelets: 228 x10E3/uL (ref 150–450)
RBC: 4.97 x10E6/uL (ref 3.77–5.28)
RDW: 14.2 % (ref 11.7–15.4)
WBC: 9.8 x10E3/uL (ref 3.4–10.8)

## 2024-07-28 LAB — LIPID PANEL
Chol/HDL Ratio: 4.1 ratio (ref 0.0–4.4)
Cholesterol, Total: 211 mg/dL — ABNORMAL HIGH (ref 100–199)
HDL: 52 mg/dL (ref >39)
LDL Chol Calc (NIH): 139 mg/dL — ABNORMAL HIGH (ref 0–99)
Triglycerides: 110 mg/dL (ref 0–149)
VLDL Cholesterol Cal: 20 mg/dL (ref 5–40)

## 2024-07-28 LAB — TSH: TSH: 2.5 u[IU]/mL (ref 0.450–4.500)

## 2024-07-28 LAB — HEMOGLOBIN A1C
Est. average glucose Bld gHb Est-mCnc: 105 mg/dL
Hgb A1c MFr Bld: 5.3 % (ref 4.8–5.6)

## 2024-07-28 LAB — VITAMIN D 25 HYDROXY (VIT D DEFICIENCY, FRACTURES): Vit D, 25-Hydroxy: 10.7 ng/mL — ABNORMAL LOW (ref 30.0–100.0)

## 2024-07-28 MED ORDER — VITAMIN D (ERGOCALCIFEROL) 1.25 MG (50000 UNIT) PO CAPS: 50000.0000 [IU] | ORAL_CAPSULE | ORAL | 1 refills | Status: AC

## 2024-07-28 NOTE — Telephone Encounter (Signed)
 Pharmacy Patient Advocate Encounter   Received notification from CoverMyMeds that prior authorization for traMADol  HCl 50MG  tablets is required/requested.   Insurance verification completed.   The patient is insured through [facility] .   Per test claim: PA required; PA submitted to above mentioned insurance via Latent Key/confirmation #/EOC [id number] Status is pending

## 2024-07-28 NOTE — Telephone Encounter (Signed)
 Pharmacy Patient Advocate Encounter  Received notification from [facility] that Prior Authorization for traMADol  HCl 50MG  tablets has been APPROVED from 07/28/2024 to 01/25/2025. Unable to obtain price due to refill too soon rejection, last fill date 07/27/2004 next available fill date09/23/2025.   PA #/Case ID/Reference #: [id number]

## 2024-07-31 ENCOUNTER — Ambulatory Visit: Admitting: Internal Medicine

## 2024-08-03 ENCOUNTER — Encounter: Payer: Self-pay | Admitting: Internal Medicine

## 2024-08-04 ENCOUNTER — Other Ambulatory Visit: Payer: Self-pay

## 2024-08-04 ENCOUNTER — Encounter: Payer: Self-pay | Admitting: Internal Medicine

## 2024-08-10 ENCOUNTER — Telehealth: Payer: Self-pay | Admitting: Internal Medicine

## 2024-08-10 NOTE — Telephone Encounter (Signed)
 Reasonable Accomodation work form  Copied Noted Sleeved Original placed in provider box Copy placed in front desk folder Call patient when ready for pick up.

## 2024-08-11 ENCOUNTER — Telehealth: Payer: Self-pay

## 2024-08-11 NOTE — Telephone Encounter (Signed)
 Updated request reasonable accommodation form  Noted Copied Sleeved Original placed provider box Copy placed front desk  Call patient when ready

## 2024-08-11 NOTE — Telephone Encounter (Signed)
 Patient dropped off document Reasonable Accommodation , to be filled out by provider. Patient requested to send it back via Call Patient to pick up within ASAP. Document is located in providers tray at front office.Please advise at Mobile [phone] (mobile)  Just the highlighted part on the form is what needs to be completed.

## 2024-08-13 NOTE — Telephone Encounter (Signed)
 Patient picked up forms.

## 2024-08-28 ENCOUNTER — Other Ambulatory Visit: Payer: Self-pay | Admitting: Internal Medicine

## 2024-08-28 DIAGNOSIS — M5136 Other intervertebral disc degeneration, lumbar region with discogenic back pain only: Secondary | ICD-10-CM

## 2024-08-28 MED ORDER — METHYLPREDNISOLONE 4 MG PO TBPK: ORAL_TABLET | ORAL | 0 refills | Status: DC

## 2024-09-14 ENCOUNTER — Encounter: Payer: Self-pay | Admitting: Radiology

## 2024-09-27 ENCOUNTER — Encounter: Payer: Self-pay | Admitting: Internal Medicine

## 2024-09-29 ENCOUNTER — Encounter: Payer: Self-pay | Admitting: Internal Medicine

## 2024-09-29 ENCOUNTER — Ambulatory Visit: Admitting: Internal Medicine

## 2024-09-29 VITALS — BP 130/70 | HR 83 | Ht 64.0 in | Wt 273.0 lb

## 2024-09-29 DIAGNOSIS — M549 Dorsalgia, unspecified: Secondary | ICD-10-CM

## 2024-09-29 DIAGNOSIS — M5136 Other intervertebral disc degeneration, lumbar region with discogenic back pain only: Secondary | ICD-10-CM | POA: Diagnosis not present

## 2024-09-29 MED ORDER — CYCLOBENZAPRINE HCL 10 MG PO TABS: 10.0000 mg | ORAL_TABLET | Freq: Every evening | ORAL | 1 refills | Status: DC | PRN

## 2024-09-29 MED ORDER — TRAMADOL HCL 50 MG PO TABS: 50.0000 mg | ORAL_TABLET | Freq: Two times a day (BID) | ORAL | 0 refills | Status: AC | PRN

## 2024-09-29 MED ORDER — KETOROLAC TROMETHAMINE 60 MG/2ML IM SOLN
60.0000 mg | Freq: Once | INTRAMUSCULAR | Status: AC
  Administered 2024-09-29: 30 mg via INTRAMUSCULAR

## 2024-09-29 MED ORDER — PREDNISONE 20 MG PO TABS: ORAL_TABLET | ORAL | 0 refills | Status: AC

## 2024-09-29 MED ORDER — METHYLPREDNISOLONE ACETATE 80 MG/ML IJ SUSP
80.0000 mg | Freq: Once | INTRAMUSCULAR | Status: AC
  Administered 2024-09-29: 80 mg via INTRAMUSCULAR

## 2024-09-29 NOTE — Patient Instructions (Signed)
 Please start taking Prednisone  as prescribed from tomorrow.  Please start take Tramadol  as needed for severe pain.  Please take Flexeril  as needed for muscle stiffness/spasms.  Okay to apply heating pad and/or back brace as needed for back pain.  Please avoid heavy lifting and frequent bending.

## 2024-09-30 DIAGNOSIS — M549 Dorsalgia, unspecified: Secondary | ICD-10-CM | POA: Insufficient documentation

## 2024-09-30 NOTE — Assessment & Plan Note (Signed)
 Since 09/15/24 Check x-ray of thoracic spine - concern for DDD of thoracic spine as she has history of lumbar DDD Flexeril  as needed for muscle spasms Prednisone  taper prescribed

## 2024-09-30 NOTE — Assessment & Plan Note (Signed)
 Acute on chronic mid and low back pain Depo-Medrol  and Toradol  IM today Prednisone  taper prescribed Noted on MRI of the lumbar spine - disc osteophyte complex causing nerve root displacement at L5 Has been evaluated by Spine surgery and PM&R - had epidural injection Had referred to PT in the previous visit, but did not follow through [facility] Mobic  due to CKD, and advised to take Tylenol  as needed Flexeril  as needed for muscle spasms Avoid heavy lifting and frequent bending

## 2024-09-30 NOTE — Progress Notes (Signed)
 Acute Office Visit  Subjective:    Patient ID: Erica Greer, female    DOB: 1966/07/24, 58 y.o.   MRN: [id number]  Chief Complaint  Patient presents with   Back Pain    Reports sx of back pain    HPI Patient is in today for complaint of acute on chronic low back pain since 09/15/24.  She also reports mid back pain for the last 2 weeks.  Pain is constant, sharp, radiating to RLE at times and is worse with movement.  She is unable to stand for more than 15 minutes due to severe pain.  She has chronic low back pain, for which she had MRI of lumbar spine in 11/24, which showed - Right foraminal/postero lateral disc osteophyte complex at L5-S1 causing mild displacement of the exiting right L5 nerve root. Advanced facet degenerative changes at L3-4 and L4-5 resulting in trace anterolisthesis at these levels. She takes Flexeril  as needed for muscle spasms.  Denies any recent heavy lifting or frequent bending.  She has stopped working at [employer] and has started working at [employer] in the [employer].  She has seen spine surgeon at [facility]. She has had epidural steroid injection in the past with [facility].   Past Medical History:  Diagnosis Date   Arthritis    [employer]reesia 01/16/2021   Chronic knee pain    HLD (hyperlipidemia)    HTN (hypertension)    Ovarian cyst    right    Subclinical hypothyroidism     Past Surgical History:  Procedure Laterality Date   COLONOSCOPY WITH PROPOFOL  N/A 09/08/2021   Procedure: COLONOSCOPY WITH PROPOFOL ;  Surgeon: Shaaron Lamar HERO, MD;  Location: [facility];  Service: Endoscopy;  Laterality: N/A;  12:45pm   LAPAROSCOPIC SALPINGO OOPHERECTOMY Right 10/04/2015   Procedure: LAPAROSCOPIC RIGHT SALPINGO OOPHORECTOMY;  Surgeon: Norleen Edsel GAILS, MD;  Location: [facility];  Service: Gynecology;  Laterality: Right;   LAPAROSCOPIC UNILATERAL SALPINGECTOMY Left 10/04/2015   Procedure: LAPAROSCOPIC LEFT  SALPINGECTOMY;  Surgeon: Norleen Edsel GAILS, MD;  Location: [facility];  Service: Gynecology;  Laterality: Left;   OVARIAN CYST REMOVAL Left    POLYPECTOMY  09/08/2021   Procedure: POLYPECTOMY;  Surgeon: Shaaron Lamar HERO, MD;  Location: [facility];  Service: Endoscopy;;  cecal; hepatic flexure   TUBAL LIGATION Left    Unilateral    Family History  Problem Relation Age of Onset   Diabetes Mother    Cancer Father    Seizures Sister    Colon cancer Neg Hx    Colon polyps Neg Hx     Social History   Socioeconomic History   Marital status: Legally Separated    Spouse name: Not on file   Number of children: Not on file   Years of education: Not on file   Highest education level: 12th grade  Occupational History   Not on file  Tobacco Use   Smoking status: Never   Smokeless tobacco: Never  Vaping Use   Vaping status: Never Used  Substance and Sexual Activity   Alcohol use: No   Drug use: No   Sexual activity: Yes    Birth control/protection: Surgical  Other Topics Concern   Not on file  Social History Narrative   Not on file   Social Drivers of Health   Financial Resource Strain: Low Risk  (12/02/2023)   Overall Financial Resource Strain (CARDIA)    Difficulty of Paying Living Expenses:  Not very hard  Food Insecurity: No Food Insecurity (12/02/2023)   Hunger Vital Sign    Worried About Running Out of Food in the Last Year: Never true    Ran Out of Food in the Last Year: Never true  Transportation Needs: No Transportation Needs (12/02/2023)   PRAPARE - Administrator, Civil Service (Medical): No    Lack of Transportation (Non-Medical): No  Physical Activity: Unknown (12/02/2023)   Exercise Vital Sign    Days of Exercise per Week: Patient declined    Minutes of Exercise per Session: Not on file  Stress: No Stress Concern Present (12/02/2023)   [employer] of Occupational Health - Occupational Stress Questionnaire    Feeling of Stress : Not at all  Social  Connections: Unknown (12/02/2023)   Social Connection and Isolation Panel    Frequency of Communication with Friends and Family: More than three times a week    Frequency of Social Gatherings with Friends and Family: Twice a week    Attends Religious Services: More than 4 times per year    Active Member of [employer] or Organizations: No    Attends Engineer, Structural: Not on file    Marital Status: Patient declined  Intimate Partner Violence: Not on file    Outpatient Medications Prior to Visit  Medication Sig Dispense Refill   amLODipine  (NORVASC ) 10 MG tablet Take 1 tablet (10 mg total) by mouth daily. 90 tablet 1   atorvastatin  (LIPITOR) 20 MG tablet Take 1 tablet (20 mg total) by mouth at bedtime. 90 tablet 1   docusate sodium  (COLACE) 100 MG capsule Take 1 capsule (100 mg total) by mouth daily as needed for mild constipation. 30 capsule 0   fluticasone  (FLONASE ) 50 MCG/ACT nasal spray Place 2 sprays into both nostrils daily. 16 g 0   hydrOXYzine  (VISTARIL ) 25 MG capsule Take 1 capsule (25 mg total) by mouth at bedtime as needed (Insomnia). 30 capsule 2   Vitamin D , Ergocalciferol , (DRISDOL ) 1.25 MG (50000 UNIT) CAPS capsule Take 1 capsule (50,000 Units total) by mouth every 7 (seven) days. 12 capsule 1   cyclobenzaprine  (FLEXERIL ) 10 MG tablet TAKE 1 TABLET BY MOUTH AT BEDTIME AS NEEDED FOR MUSCLE SPASMS 30 tablet 1   methylPREDNISolone  (MEDROL  DOSEPAK) 4 MG TBPK tablet Take as package instructions. 1 each 0   traMADol  (ULTRAM ) 50 MG tablet Take 1 tablet (50 mg total) by mouth every 12 (twelve) hours as needed. 20 tablet 0   No facility-administered medications prior to visit.    Allergies  Allergen Reactions   Hydrocodone  Nausea And Vomiting    Review of Systems  Constitutional:  Negative for chills and fever.  HENT:  Negative for congestion, sinus pressure, sinus pain and sore throat.   Respiratory:  Negative for cough and shortness of breath.   Cardiovascular:   Negative for chest pain and palpitations.  Gastrointestinal:  Positive for constipation. Negative for nausea and vomiting.  Genitourinary:  Negative for dysuria and hematuria.  Musculoskeletal:  Positive for back pain. Negative for neck pain and neck stiffness.  Skin:  Negative for rash.  Neurological:  Negative for dizziness and weakness.  Psychiatric/Behavioral:  Positive for sleep disturbance. Negative for agitation and behavioral problems.        Objective:    Physical Exam Vitals reviewed.  Constitutional:      General: She is not in acute distress.    Appearance: She is obese. She is not diaphoretic.  HENT:  Head: Normocephalic and atraumatic.     Nose: Nose normal.     Mouth/Throat:     Mouth: Mucous membranes are moist.  Eyes:     General: No scleral icterus.    Extraocular Movements: Extraocular movements intact.  Cardiovascular:     Rate and Rhythm: Normal rate and regular rhythm.     Heart sounds: Normal heart sounds. No murmur heard. Pulmonary:     Breath sounds: Normal breath sounds. No wheezing or rales.  Musculoskeletal:     Cervical back: Neck supple. No tenderness.     Thoracic back: Tenderness present.     Lumbar back: Tenderness present. Decreased range of motion. Positive right straight leg raise test. Negative left straight leg raise test.     Right lower leg: No edema.     Left lower leg: No edema.  Skin:    General: Skin is warm.     Findings: No rash.  Neurological:     General: No focal deficit present.     Mental Status: She is alert and oriented to person, place, and time.     Sensory: No sensory deficit.     Motor: No weakness.  Psychiatric:        Mood and Affect: Mood normal.        Behavior: Behavior normal.     BP 130/70   Pulse 83   Ht 5' 4 (1.626 m)   Wt 273 lb (123.8 kg)   LMP 02/11/2015   SpO2 100%   BMI 46.86 kg/m  Wt Readings from Last 3 Encounters:  09/29/24 273 lb (123.8 kg)  07/24/24 276 lb 6.4 oz (125.4 kg)   03/30/24 278 lb (126.1 kg)        Assessment & Plan:   Problem List Items Addressed This Visit       Musculoskeletal and Integument   DDD (degenerative disc disease), lumbar - Primary   Acute on chronic mid and low back pain Depo-Medrol  and Toradol  IM today Prednisone  taper prescribed Noted on MRI of the lumbar spine - disc osteophyte complex causing nerve root displacement at L5 Has been evaluated by Spine surgery and PM&R - had epidural injection Had referred to PT in the previous visit, but did not follow through [facility]  due to CKD, and advised to take Tylenol  as needed Flexeril  as needed for muscle spasms Avoid heavy lifting and frequent bending      Relevant Medications   traMADol  (ULTRAM ) 50 MG tablet   predniSONE  (DELTASONE ) 20 MG tablet   cyclobenzaprine  (FLEXERIL ) 10 MG tablet   Other Relevant Orders   Ambulatory referral to Physical Medicine Rehab     Other   Mid back pain   Since 09/15/24 Check x-ray of thoracic spine - concern for DDD of thoracic spine as she has history of lumbar DDD Flexeril  as needed for muscle spasms Prednisone  taper prescribed      Relevant Medications   traMADol  (ULTRAM ) 50 MG tablet   predniSONE  (DELTASONE ) 20 MG tablet   cyclobenzaprine  (FLEXERIL ) 10 MG tablet   Other Relevant Orders   DG Thoracic Spine W/Swimmers     Meds ordered this encounter  Medications   traMADol  (ULTRAM ) 50 MG tablet    Sig: Take 1 tablet (50 mg total) by mouth every 12 (twelve) hours as needed.    Dispense:  30 tablet    Refill:  0   predniSONE  (DELTASONE ) 20 MG tablet    Sig: Take 2 tablets (40 mg total) by  mouth daily with breakfast for 2 days, THEN 1 tablet (20 mg total) daily with breakfast for 2 days, THEN 0.5 tablets (10 mg total) daily with breakfast for 2 days.    Dispense:  7 tablet    Refill:  0   cyclobenzaprine  (FLEXERIL ) 10 MG tablet    Sig: Take 1 tablet (10 mg total) by mouth at bedtime as needed for muscle spasms.    Dispense:   30 tablet    Refill:  1   ketorolac  (TORADOL ) injection 60 mg   methylPREDNISolone  acetate (DEPO-MEDROL ) injection 80 mg     Suzzane MARLA Blanch, MD

## 2024-10-02 ENCOUNTER — Other Ambulatory Visit: Payer: Self-pay | Admitting: Internal Medicine

## 2024-10-02 DIAGNOSIS — I10 Essential (primary) hypertension: Secondary | ICD-10-CM

## 2024-11-06 ENCOUNTER — Other Ambulatory Visit: Payer: Self-pay | Admitting: Internal Medicine

## 2024-11-06 DIAGNOSIS — E782 Mixed hyperlipidemia: Secondary | ICD-10-CM

## 2024-11-26 ENCOUNTER — Ambulatory Visit: Admitting: Physician Assistant

## 2024-12-05 ENCOUNTER — Other Ambulatory Visit: Payer: Self-pay | Admitting: Internal Medicine

## 2024-12-05 DIAGNOSIS — M5136 Other intervertebral disc degeneration, lumbar region with discogenic back pain only: Secondary | ICD-10-CM

## 2025-01-22 ENCOUNTER — Ambulatory Visit: Admitting: Internal Medicine
